# Patient Record
Sex: Female | Born: 1935 | Race: White | Hispanic: No | Marital: Married | Smoking: Never smoker
Health system: Southern US, Community
[De-identification: ages and names within clinical notes are randomized; demographics above are authoritative.]

## PROBLEM LIST (undated history)

## (undated) DIAGNOSIS — T7840XA Allergy, unspecified, initial encounter: Secondary | ICD-10-CM

## (undated) DIAGNOSIS — I059 Rheumatic mitral valve disease, unspecified: Secondary | ICD-10-CM

## (undated) DIAGNOSIS — K644 Residual hemorrhoidal skin tags: Secondary | ICD-10-CM

## (undated) DIAGNOSIS — R002 Palpitations: Secondary | ICD-10-CM

## (undated) DIAGNOSIS — I4891 Unspecified atrial fibrillation: Secondary | ICD-10-CM

## (undated) DIAGNOSIS — I2510 Atherosclerotic heart disease of native coronary artery without angina pectoris: Secondary | ICD-10-CM

## (undated) DIAGNOSIS — I509 Heart failure, unspecified: Secondary | ICD-10-CM

## (undated) DIAGNOSIS — R300 Dysuria: Secondary | ICD-10-CM

## (undated) DIAGNOSIS — E1129 Type 2 diabetes mellitus with other diabetic kidney complication: Secondary | ICD-10-CM

## (undated) DIAGNOSIS — E119 Type 2 diabetes mellitus without complications: Secondary | ICD-10-CM

## (undated) DIAGNOSIS — M1990 Unspecified osteoarthritis, unspecified site: Secondary | ICD-10-CM

## (undated) DIAGNOSIS — I10 Essential (primary) hypertension: Secondary | ICD-10-CM

## (undated) DIAGNOSIS — R5381 Other malaise: Secondary | ICD-10-CM

## (undated) DIAGNOSIS — E559 Vitamin D deficiency, unspecified: Secondary | ICD-10-CM

## (undated) DIAGNOSIS — D649 Anemia, unspecified: Secondary | ICD-10-CM

## (undated) DIAGNOSIS — R609 Edema, unspecified: Secondary | ICD-10-CM

## (undated) DIAGNOSIS — M2550 Pain in unspecified joint: Secondary | ICD-10-CM

## (undated) DIAGNOSIS — E871 Hypo-osmolality and hyponatremia: Secondary | ICD-10-CM

## (undated) DIAGNOSIS — E785 Hyperlipidemia, unspecified: Secondary | ICD-10-CM

## (undated) DIAGNOSIS — I8390 Asymptomatic varicose veins of unspecified lower extremity: Secondary | ICD-10-CM

## (undated) DIAGNOSIS — R110 Nausea: Secondary | ICD-10-CM

## (undated) DIAGNOSIS — R5383 Other fatigue: Secondary | ICD-10-CM

## (undated) DIAGNOSIS — E781 Pure hyperglyceridemia: Secondary | ICD-10-CM

## (undated) DIAGNOSIS — K219 Gastro-esophageal reflux disease without esophagitis: Secondary | ICD-10-CM

## (undated) DIAGNOSIS — M791 Myalgia, unspecified site: Secondary | ICD-10-CM

## (undated) DIAGNOSIS — R194 Change in bowel habit: Secondary | ICD-10-CM

## (undated) HISTORY — DX: Unspecified atrial fibrillation: I48.91

## (undated) HISTORY — DX: Hyperlipidemia, unspecified: E78.5

## (undated) HISTORY — PX: APPENDECTOMY: SHX54

## (undated) HISTORY — DX: Change in bowel habit: R19.4

## (undated) HISTORY — DX: Rheumatic mitral valve disease, unspecified: I05.9

## (undated) HISTORY — PX: HERNIA REPAIR: SHX51

## (undated) HISTORY — DX: Pain in unspecified joint: M25.50

## (undated) HISTORY — DX: Allergy, unspecified, initial encounter: T78.40XA

## (undated) HISTORY — PX: ABDOMINAL HYSTERECTOMY: SHX81

## (undated) HISTORY — DX: Heart failure, unspecified: I50.9

## (undated) HISTORY — PX: CHOLECYSTECTOMY: SHX55

## (undated) HISTORY — DX: Pure hyperglyceridemia: E78.1

## (undated) HISTORY — DX: Myalgia, unspecified site: M79.10

## (undated) HISTORY — DX: Gastro-esophageal reflux disease without esophagitis: K21.9

## (undated) HISTORY — DX: Nausea: R11.0

## (undated) HISTORY — DX: Hypo-osmolality and hyponatremia: E87.1

## (undated) HISTORY — DX: Atherosclerotic heart disease of native coronary artery without angina pectoris: I25.10

## (undated) HISTORY — DX: Edema, unspecified: R60.9

## (undated) HISTORY — DX: Unspecified osteoarthritis, unspecified site: M19.90

## (undated) HISTORY — DX: Other malaise: R53.81

## (undated) HISTORY — DX: Type 2 diabetes mellitus with other diabetic kidney complication: E11.29

## (undated) HISTORY — PX: TONSILLECTOMY: SUR1361

## (undated) HISTORY — DX: Vitamin D deficiency, unspecified: E55.9

## (undated) HISTORY — DX: Anemia, unspecified: D64.9

## (undated) HISTORY — PX: BREAST SURGERY: SHX581

## (undated) HISTORY — DX: Palpitations: R00.2

## (undated) HISTORY — DX: Residual hemorrhoidal skin tags: K64.4

## (undated) HISTORY — DX: Other fatigue: R53.83

## (undated) HISTORY — DX: Asymptomatic varicose veins of unspecified lower extremity: I83.90

## (undated) HISTORY — DX: Dysuria: R30.0

---

## 2005-02-08 ENCOUNTER — Ambulatory Visit: Payer: Self-pay | Admitting: Family Medicine

## 2005-02-15 ENCOUNTER — Ambulatory Visit: Payer: Self-pay | Admitting: Family Medicine

## 2005-03-25 ENCOUNTER — Ambulatory Visit: Payer: Self-pay | Admitting: Family Medicine

## 2005-04-14 ENCOUNTER — Other Ambulatory Visit: Payer: Self-pay

## 2005-04-14 ENCOUNTER — Emergency Department: Payer: Self-pay | Admitting: Emergency Medicine

## 2005-04-23 ENCOUNTER — Other Ambulatory Visit: Payer: Self-pay

## 2005-04-23 ENCOUNTER — Emergency Department: Payer: Self-pay | Admitting: Emergency Medicine

## 2005-06-06 ENCOUNTER — Inpatient Hospital Stay: Payer: Self-pay | Admitting: Internal Medicine

## 2005-06-06 ENCOUNTER — Other Ambulatory Visit: Payer: Self-pay

## 2005-06-07 ENCOUNTER — Other Ambulatory Visit: Payer: Self-pay

## 2006-01-19 ENCOUNTER — Ambulatory Visit: Payer: Self-pay

## 2006-02-07 ENCOUNTER — Ambulatory Visit: Payer: Self-pay

## 2006-03-10 ENCOUNTER — Other Ambulatory Visit: Payer: Self-pay

## 2006-03-10 ENCOUNTER — Inpatient Hospital Stay: Payer: Self-pay | Admitting: Internal Medicine

## 2006-03-11 ENCOUNTER — Other Ambulatory Visit: Payer: Self-pay

## 2006-03-18 ENCOUNTER — Other Ambulatory Visit: Payer: Self-pay

## 2006-04-13 ENCOUNTER — Ambulatory Visit: Payer: Self-pay | Admitting: Family Medicine

## 2006-04-27 ENCOUNTER — Ambulatory Visit: Payer: Self-pay | Admitting: Family Medicine

## 2006-05-08 ENCOUNTER — Ambulatory Visit: Payer: Self-pay | Admitting: Neurosurgery

## 2007-04-21 IMAGING — CR DG CHEST 1V PORT
1 series · 1 of 1 positions shown · non-contrast
Comparison: none

REASON FOR EXAM: irrg heart beat
COMMENTS:

PROCEDURE:     DXR - DXR PORTABLE CHEST SINGLE VIEW  - June 06, 2005  [DATE]
RESULT:       Comparison is made to a prior study dated 04/23/05.
The lungs are clear.  The cardiac silhouette and visualized bony skeleton
are unremarkable.

[view not recorded]
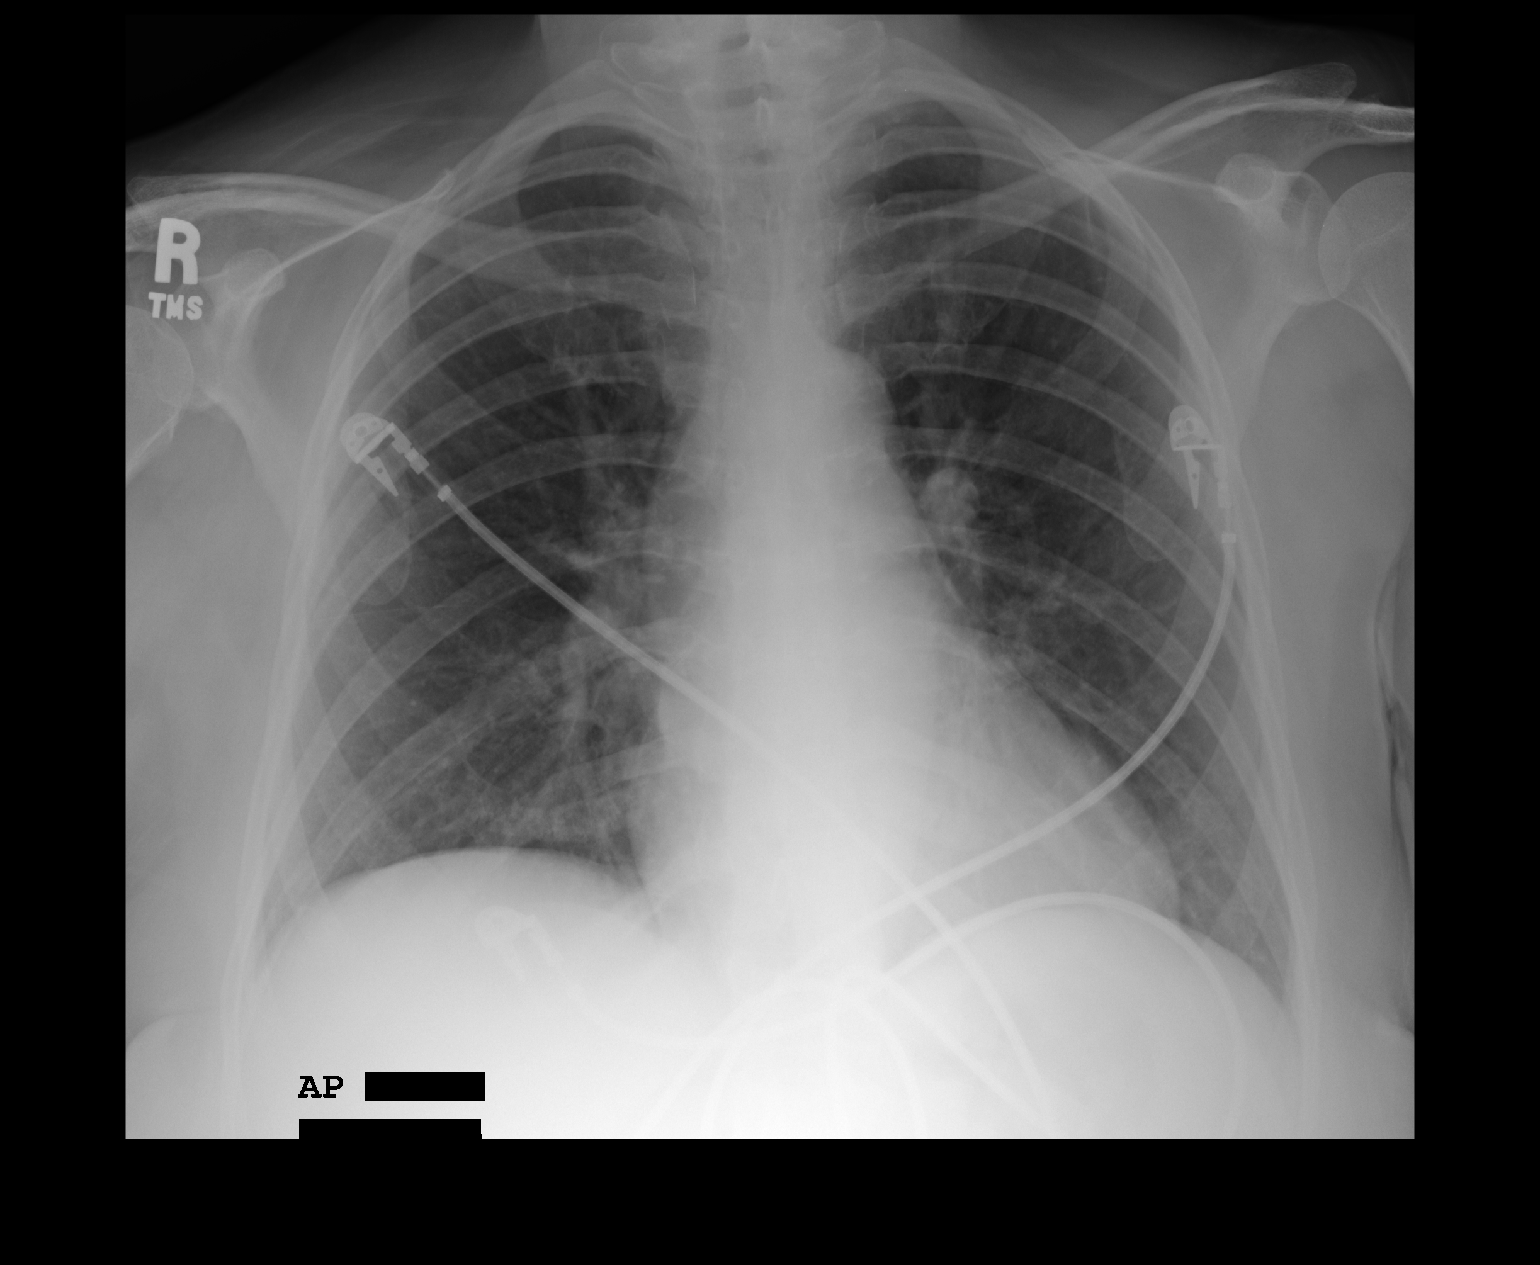

[1 of 1 positions shown; findings below may reference images not displayed]

IMPRESSION: Chest radiograph without evidence of acute cardiopulmonary
disease.

## 2007-05-02 ENCOUNTER — Ambulatory Visit: Payer: Self-pay | Admitting: Family Medicine

## 2007-11-01 ENCOUNTER — Ambulatory Visit: Payer: Self-pay | Admitting: Neurosurgery

## 2008-05-02 ENCOUNTER — Ambulatory Visit: Payer: Self-pay | Admitting: Family Medicine

## 2009-03-19 DIAGNOSIS — E1129 Type 2 diabetes mellitus with other diabetic kidney complication: Secondary | ICD-10-CM

## 2009-03-19 HISTORY — DX: Type 2 diabetes mellitus with other diabetic kidney complication: E11.29

## 2009-04-01 DIAGNOSIS — I059 Rheumatic mitral valve disease, unspecified: Secondary | ICD-10-CM

## 2009-04-01 DIAGNOSIS — I4891 Unspecified atrial fibrillation: Secondary | ICD-10-CM

## 2009-04-01 HISTORY — DX: Rheumatic mitral valve disease, unspecified: I05.9

## 2009-04-01 HISTORY — DX: Unspecified atrial fibrillation: I48.91

## 2009-04-04 DIAGNOSIS — T7840XA Allergy, unspecified, initial encounter: Secondary | ICD-10-CM

## 2009-04-04 DIAGNOSIS — J309 Allergic rhinitis, unspecified: Secondary | ICD-10-CM | POA: Insufficient documentation

## 2009-04-04 HISTORY — DX: Allergy, unspecified, initial encounter: T78.40XA

## 2009-05-05 ENCOUNTER — Ambulatory Visit: Payer: Self-pay | Admitting: Family Medicine

## 2009-08-04 DIAGNOSIS — I2510 Atherosclerotic heart disease of native coronary artery without angina pectoris: Secondary | ICD-10-CM

## 2009-08-04 DIAGNOSIS — K644 Residual hemorrhoidal skin tags: Secondary | ICD-10-CM

## 2009-08-04 HISTORY — DX: Atherosclerotic heart disease of native coronary artery without angina pectoris: I25.10

## 2009-08-04 HISTORY — DX: Residual hemorrhoidal skin tags: K64.4

## 2009-09-17 ENCOUNTER — Inpatient Hospital Stay: Payer: Self-pay | Admitting: Internal Medicine

## 2009-10-28 ENCOUNTER — Ambulatory Visit: Payer: Self-pay | Admitting: Gastroenterology

## 2009-11-06 DIAGNOSIS — K219 Gastro-esophageal reflux disease without esophagitis: Secondary | ICD-10-CM | POA: Insufficient documentation

## 2009-11-06 HISTORY — DX: Gastro-esophageal reflux disease without esophagitis: K21.9

## 2009-11-28 HISTORY — PX: OTHER SURGICAL HISTORY: SHX169

## 2010-02-01 DIAGNOSIS — E785 Hyperlipidemia, unspecified: Secondary | ICD-10-CM | POA: Insufficient documentation

## 2010-02-01 HISTORY — DX: Hyperlipidemia, unspecified: E78.5

## 2010-05-25 DIAGNOSIS — R5381 Other malaise: Secondary | ICD-10-CM | POA: Insufficient documentation

## 2010-06-21 ENCOUNTER — Ambulatory Visit: Payer: Self-pay | Admitting: Gastroenterology

## 2010-06-21 LAB — HM COLONOSCOPY

## 2010-06-23 LAB — PATHOLOGY REPORT

## 2010-08-23 ENCOUNTER — Ambulatory Visit: Payer: Self-pay | Admitting: Family Medicine

## 2010-09-09 ENCOUNTER — Ambulatory Visit: Payer: Self-pay | Admitting: Family Medicine

## 2011-08-02 IMAGING — CT CT HEAD WITHOUT CONTRAST
2 series · 16 of 30 positions shown, 20 images · non-contrast
Comparison: none

REASON FOR EXAM: ha weakness htn
COMMENTS:

[Series 2: without · axial · non-contrast · 0.42mm/px · z∈[+145,+270]mm · 13 of 31 slices shown, 17 images]
[im 3/31  brain]
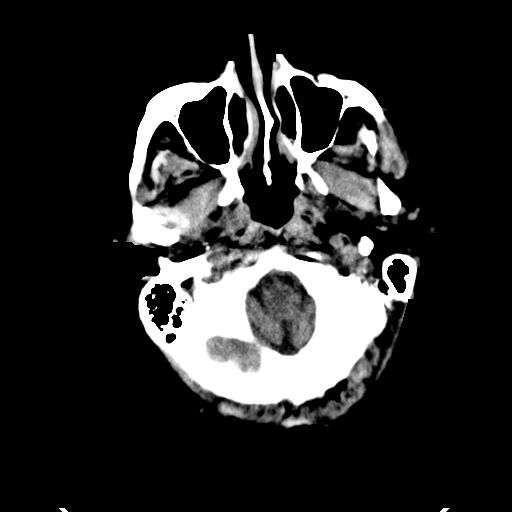
[im 3/31  bone]
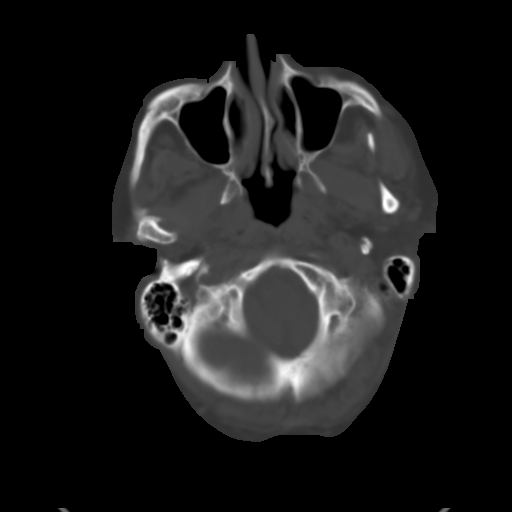
[im 5/31  brain]
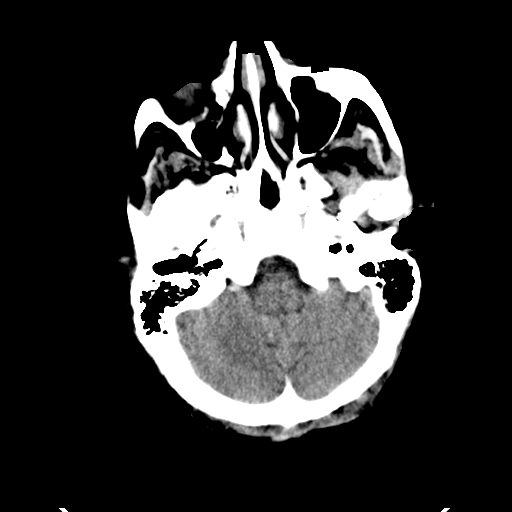
[im 7/31  brain]
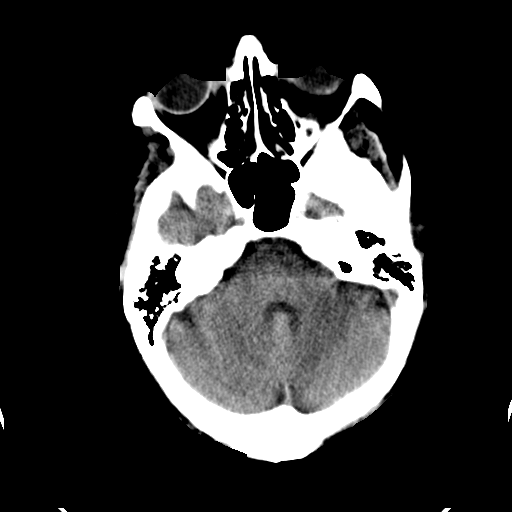
[im 9/31  brain]
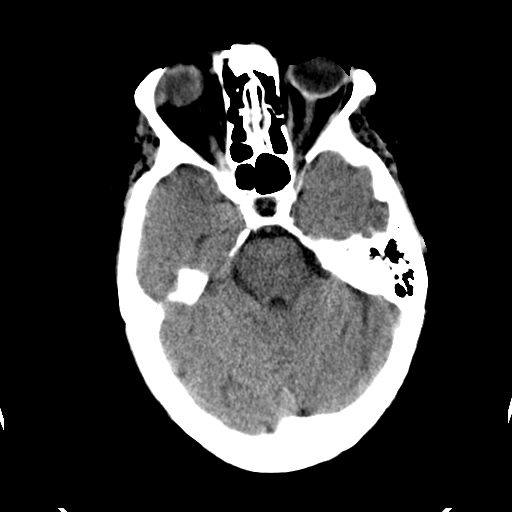
[im 11/31  brain]
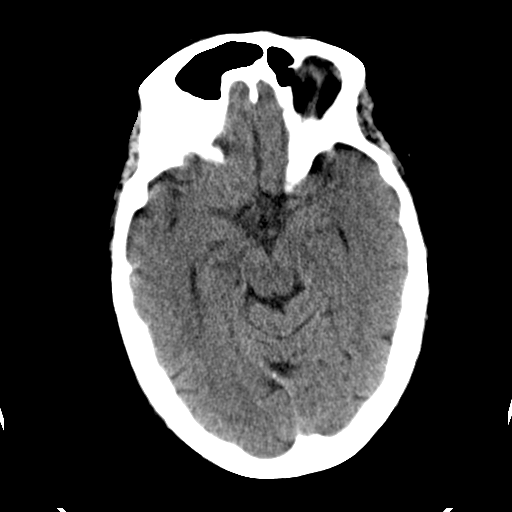
[im 11/31  bone]
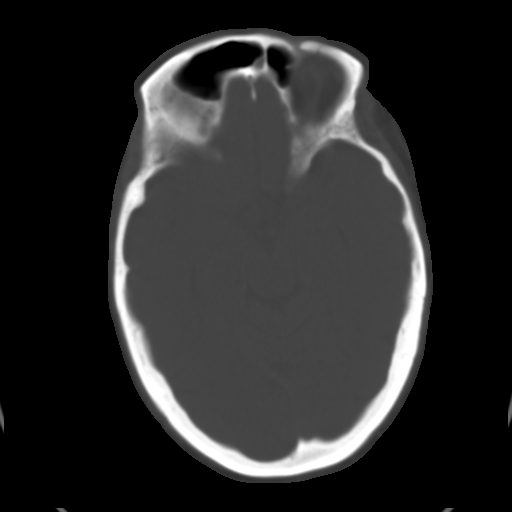
[im 13/31  brain]
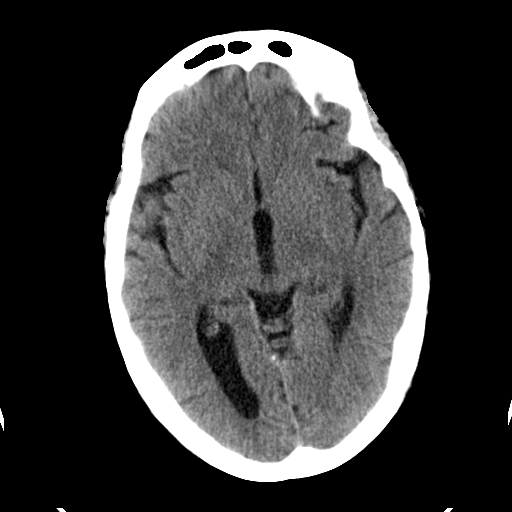
[im 16/31  brain]
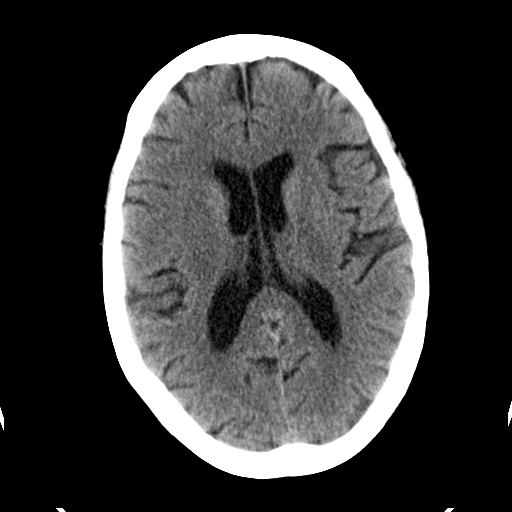
[im 18/31  brain]
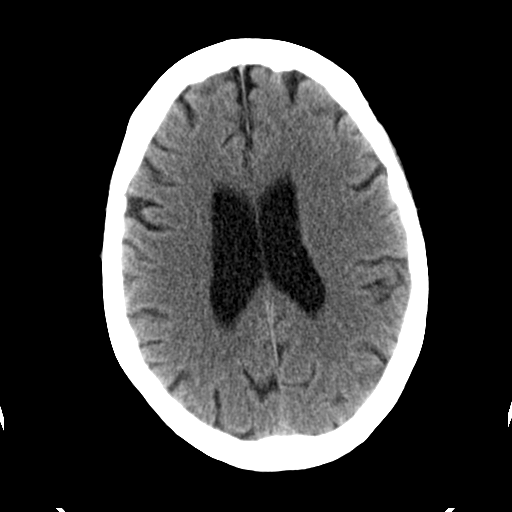
[im 20/31  brain]
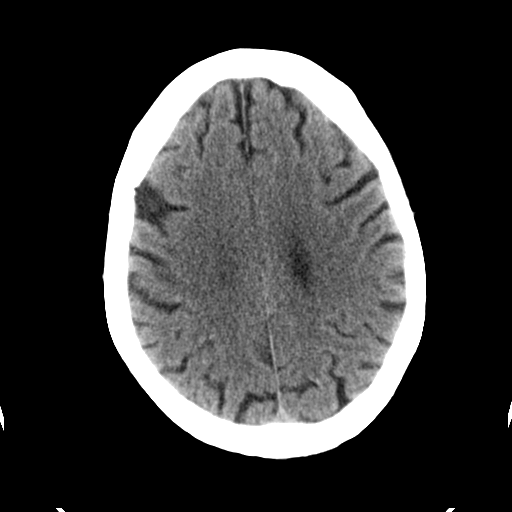
[im 20/31  bone]
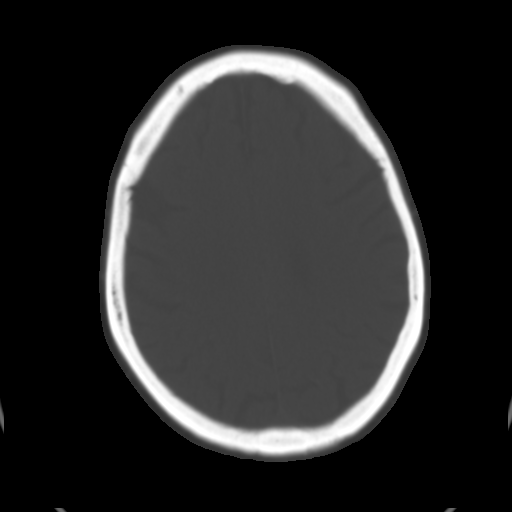
[im 22/31  brain]
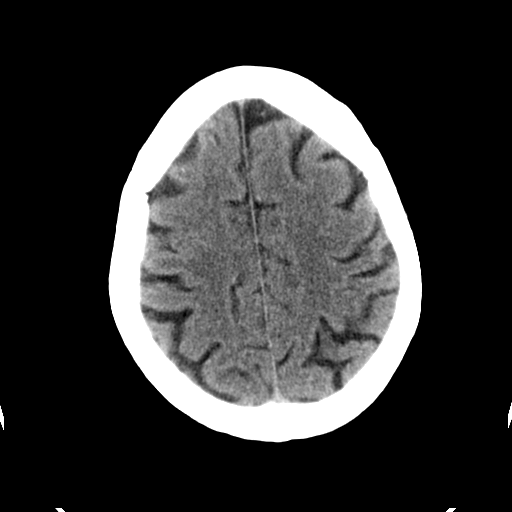
[im 24/31  brain]
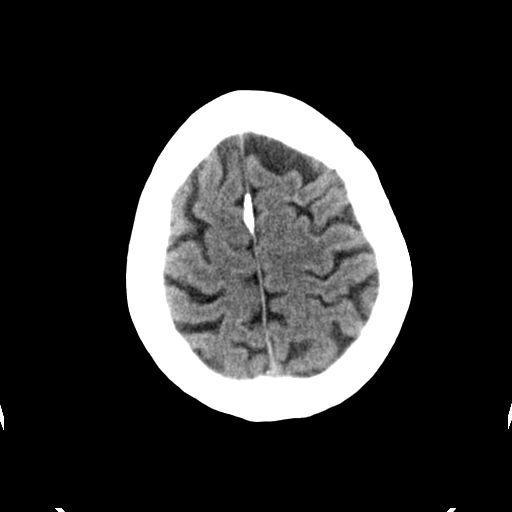
[im 26/31  brain]
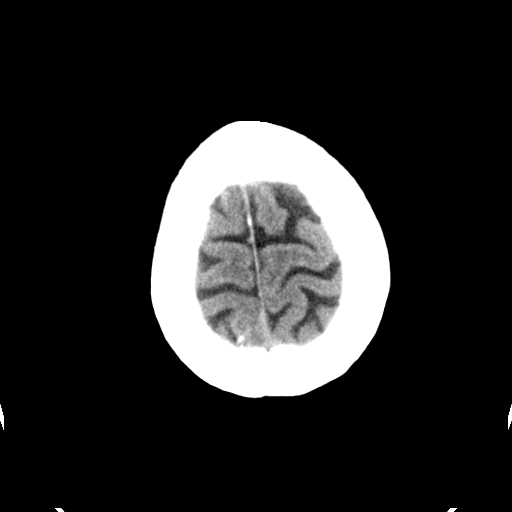
[im 28/31  brain]
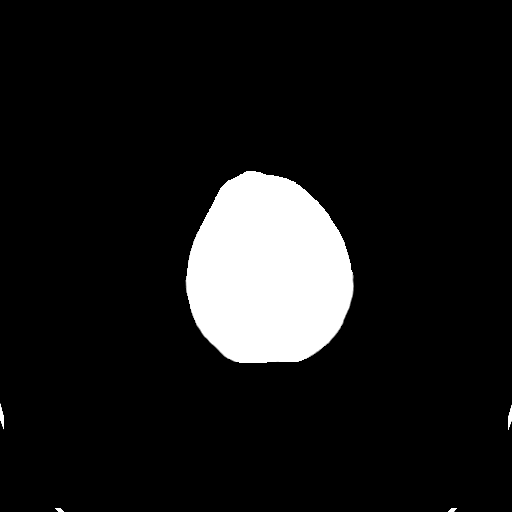
[im 28/31  bone]
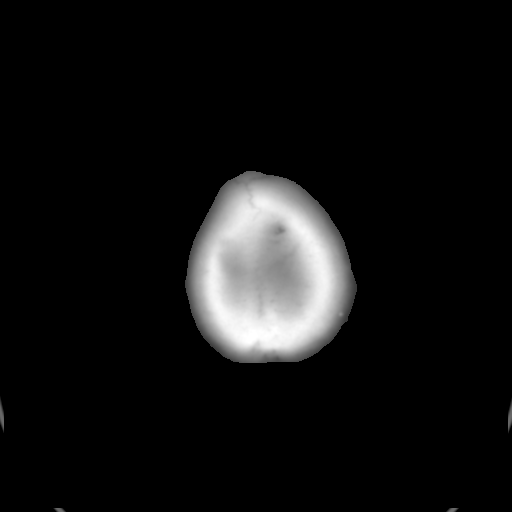

[Series 3: bone · axial · 0.42mm/px · z∈[+145,+185]mm · 3 of 30 slices shown]
[im 3/30  bone]
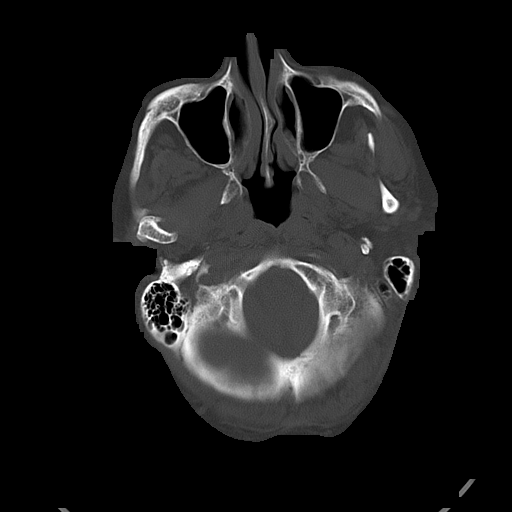
[im 7/30  bone]
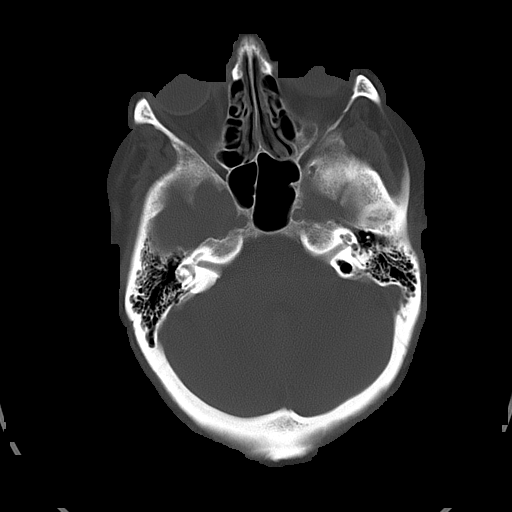
[im 11/30  bone]
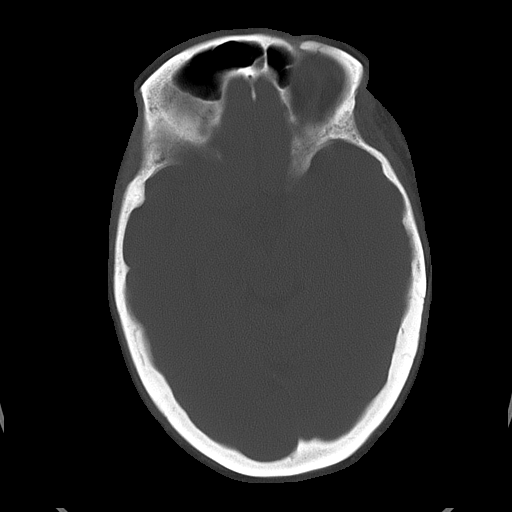

[16 of 30 positions shown; findings below may reference images not displayed]

PROCEDURE:     CT  - CT HEAD WITHOUT CONTRAST  - September 17, 2009  [DATE]

RESULT:     History: Weakness, headache, hypertension.

Comparison Study: No intra-axial or extra-axial pathologic fluid or blood
collections identified. Old left lenticular nucleus lacunar infarct noted.
No evidence of hemorrhage or hydrocephalus.
IMPRESSION: Chronic ischemic changem, no acute abnormality.

## 2011-08-03 IMAGING — CR DG ABDOMEN 2V
1 series · 2 of 2 positions shown · non-contrast
Comparison: none

REASON FOR EXAM: constipation
COMMENTS:

PROCEDURE:     DXR - DXR ABDOMEN 2 V FLAT AND ERECT  - September 18, 2009  [DATE]
RESULT:     The bowel gas pattern suggests an element of constipation. There
is no evidence of obstruction or ileus. The lung bases are grossly clear.
There are surgical clips in the gallbladder fossa.

[Series 1: view not recorded · 0.17mm/px · 2 of 2 slices shown]
[im 1/2]
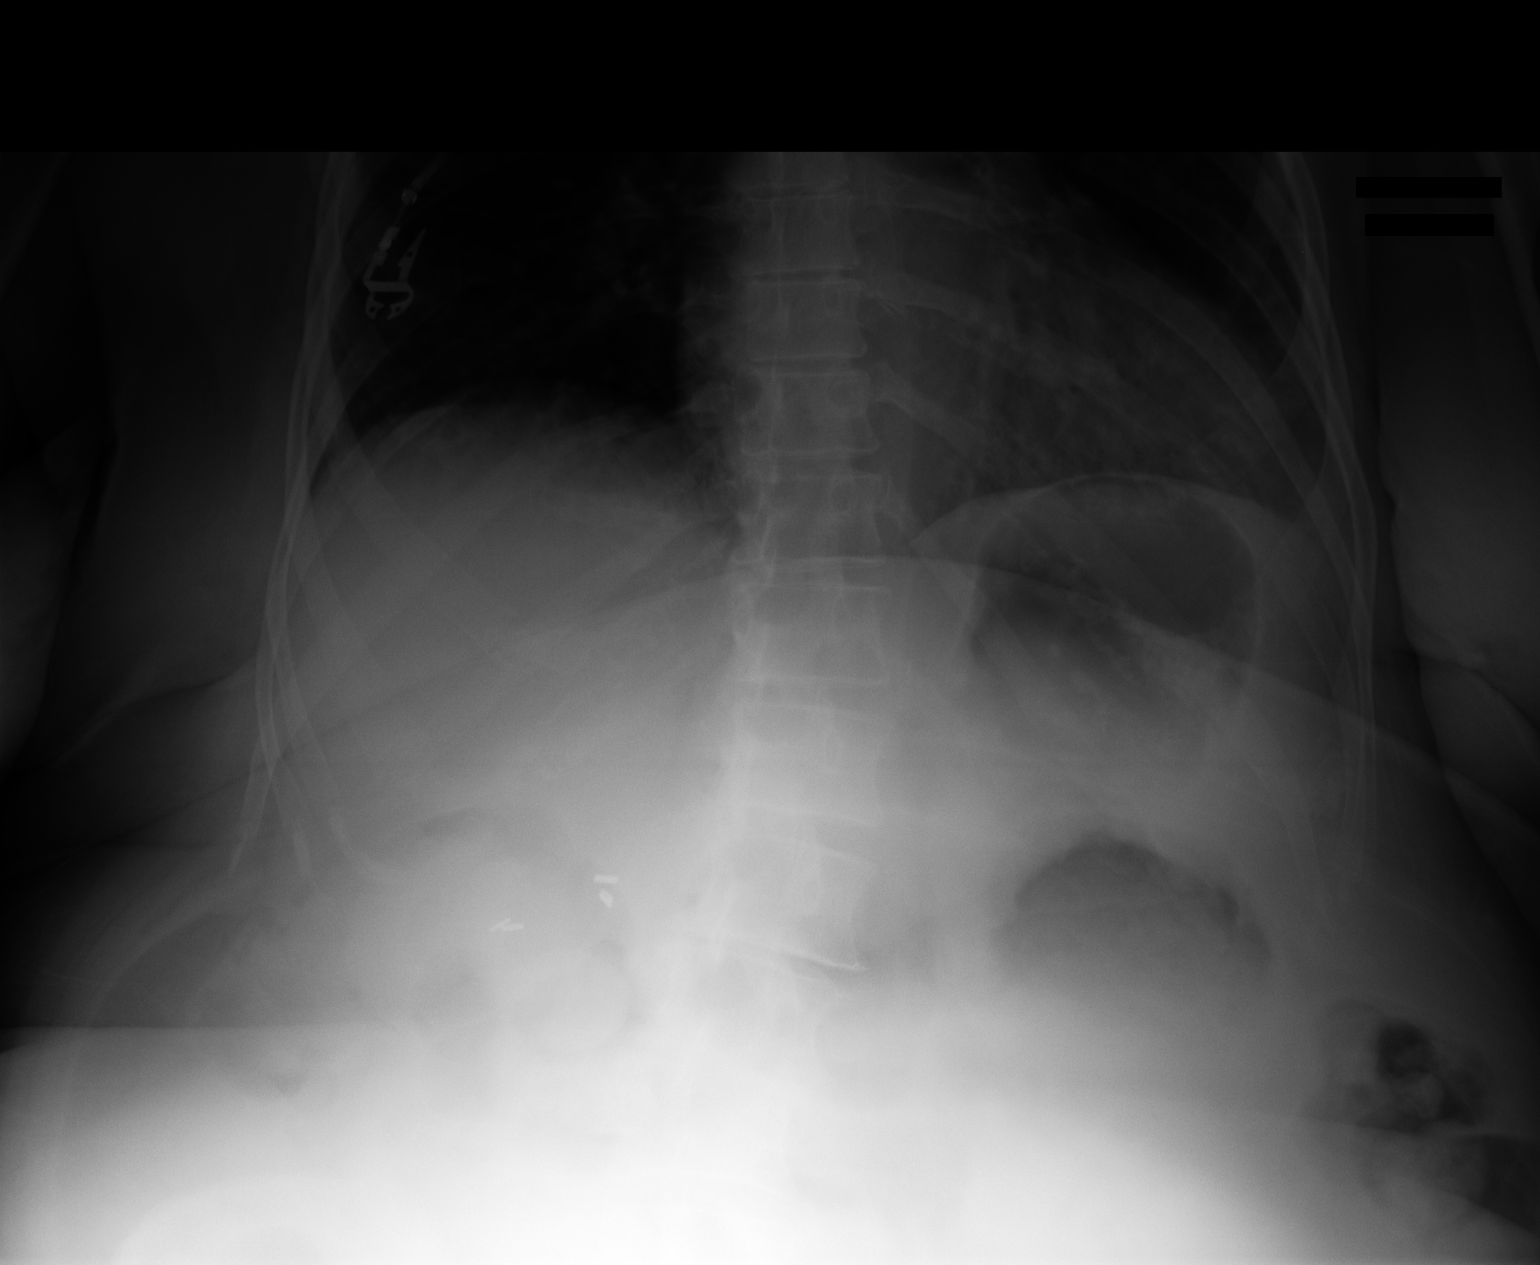
[im 2/2]
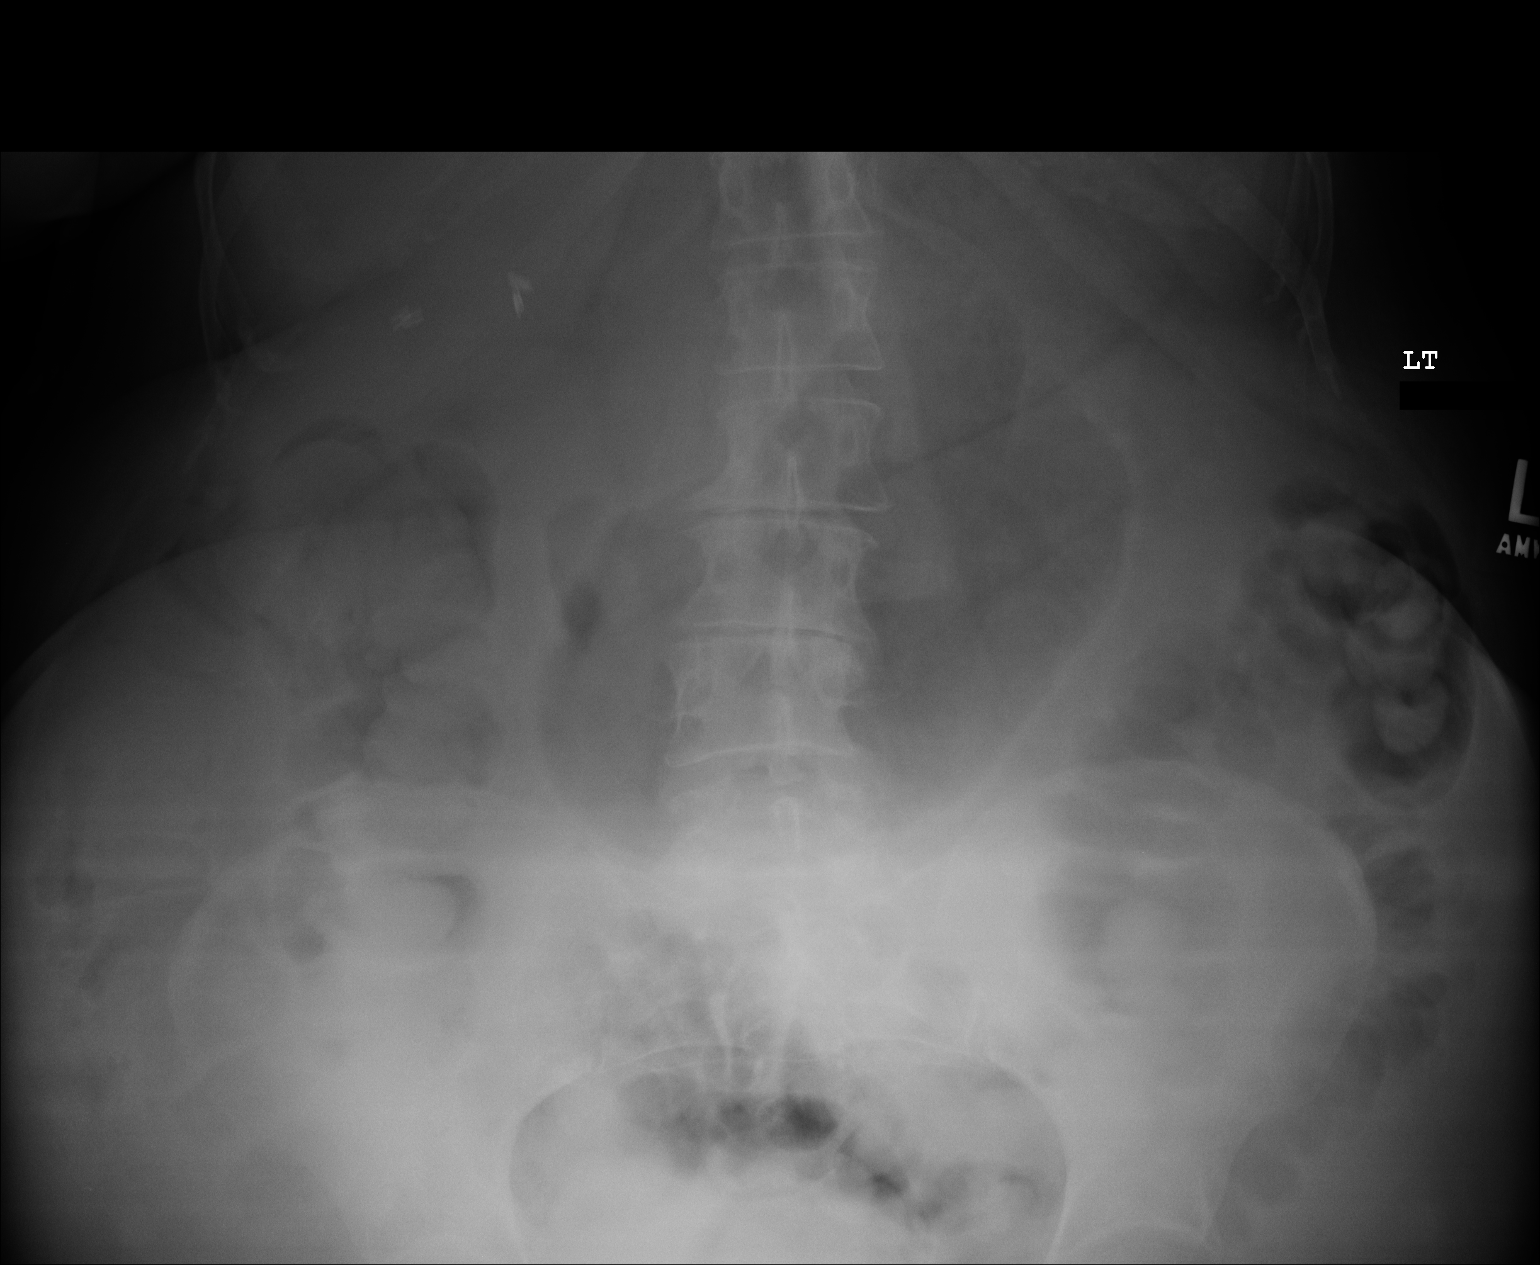

[2 of 2 positions shown; findings below may reference images not displayed]

IMPRESSION: The bowel gas pattern suggests constipation.

## 2011-09-21 ENCOUNTER — Ambulatory Visit: Payer: Self-pay | Admitting: Family Medicine

## 2012-08-03 ENCOUNTER — Ambulatory Visit: Payer: Self-pay | Admitting: Family Medicine

## 2012-09-24 ENCOUNTER — Ambulatory Visit: Payer: Self-pay | Admitting: Family Medicine

## 2012-10-01 ENCOUNTER — Ambulatory Visit: Payer: Self-pay | Admitting: Ophthalmology

## 2012-10-01 DIAGNOSIS — I10 Essential (primary) hypertension: Secondary | ICD-10-CM

## 2012-10-01 LAB — POTASSIUM: Potassium: 4.1 mmol/L (ref 3.5–5.1)

## 2012-10-10 ENCOUNTER — Ambulatory Visit: Payer: Self-pay | Admitting: Ophthalmology

## 2013-09-25 ENCOUNTER — Ambulatory Visit: Payer: Self-pay | Admitting: Family Medicine

## 2014-04-29 LAB — HEPATIC FUNCTION PANEL
ALT: 13 U/L (ref 7–35)
AST: 14 U/L (ref 13–35)

## 2014-08-12 ENCOUNTER — Emergency Department: Payer: Self-pay | Admitting: Student

## 2014-08-12 LAB — BASIC METABOLIC PANEL
ANION GAP: 11 (ref 7–16)
BUN: 33 mg/dL — ABNORMAL HIGH (ref 7–18)
Calcium, Total: 9.1 mg/dL (ref 8.5–10.1)
Chloride: 99 mmol/L (ref 98–107)
Co2: 24 mmol/L (ref 21–32)
Creatinine: 0.82 mg/dL (ref 0.60–1.30)
Glucose: 136 mg/dL — ABNORMAL HIGH (ref 65–99)
Osmolality: 278 (ref 275–301)
Potassium: 4 mmol/L (ref 3.5–5.1)
SODIUM: 134 mmol/L — AB (ref 136–145)

## 2014-08-12 LAB — CBC
HCT: 35.3 % (ref 35.0–47.0)
HGB: 11.5 g/dL — AB (ref 12.0–16.0)
MCH: 29.3 pg (ref 26.0–34.0)
MCHC: 32.6 g/dL (ref 32.0–36.0)
MCV: 90 fL (ref 80–100)
Platelet: 297 10*3/uL (ref 150–440)
RBC: 3.93 10*6/uL (ref 3.80–5.20)
RDW: 14.3 % (ref 11.5–14.5)
WBC: 10.2 10*3/uL (ref 3.6–11.0)

## 2014-08-12 LAB — URINALYSIS, COMPLETE
BACTERIA: NONE SEEN
BILIRUBIN, UR: NEGATIVE
Blood: NEGATIVE
Glucose,UR: NEGATIVE mg/dL (ref 0–75)
Ketone: NEGATIVE
Leukocyte Esterase: NEGATIVE
Nitrite: NEGATIVE
Ph: 7 (ref 4.5–8.0)
RBC, UR: NONE SEEN /HPF (ref 0–5)
Specific Gravity: 1.005 (ref 1.003–1.030)
Squamous Epithelial: NONE SEEN
WBC UR: 3 /HPF (ref 0–5)

## 2014-08-12 LAB — TROPONIN I

## 2014-09-06 LAB — HM MAMMOGRAPHY

## 2014-09-30 ENCOUNTER — Ambulatory Visit: Payer: Self-pay | Admitting: Family Medicine

## 2014-10-07 ENCOUNTER — Ambulatory Visit: Payer: Self-pay | Admitting: Family Medicine

## 2015-01-15 DIAGNOSIS — I480 Paroxysmal atrial fibrillation: Secondary | ICD-10-CM | POA: Insufficient documentation

## 2015-01-15 DIAGNOSIS — R600 Localized edema: Secondary | ICD-10-CM | POA: Insufficient documentation

## 2015-03-05 LAB — CBC AND DIFFERENTIAL: WBC: 10.4 10^3/mL

## 2015-03-05 LAB — HEMOGLOBIN A1C: Hgb A1c MFr Bld: 6.8 % — AB (ref 4.0–6.0)

## 2015-03-17 NOTE — Op Note (Signed)
PATIENT NAME:  [name], Katherine BeanCONSTANCE C MR#:  [id number] DATE OF BIRTH:  10/07/1932  DATE OF PROCEDURE:  10/10/2012  PREOPERATIVE DIAGNOSIS:  Senile cataract left eye.  POSTOPERATIVE DIAGNOSIS:  Senile cataract left eye.  PROCEDURE:  Phacoemulsification with posterior chamber intraocular lens implantation of the left eye.  LENS:  ZCB00 23.5-diopter posterior chamber intraocular lens.  ULTRASOUND TIME:  12% of 1 minute, 26 seconds. CDE 10.2.  SURGEON:  Italyhad Marranda Arakelian, MD  ANESTHESIA:  Retrobulbar block of Xylocaine and Bupivacaine.  COMPLICATIONS:  None.  DESCRIPTION OF PROCEDURE:  The patient was identified in the holding room and transported to the operating room and placed in the supine position under the operating microscope.  The left eye was identified as the operative eye and a retrobulbar block was performed under intravenous sedation.  It was then prepped and draped in the usual sterile ophthalmic fashion.  A 1 millimeter clear-corneal paracentesis was made at the 1:30 position.  The anterior chamber was filled with Viscoat viscoelastic.  A 2.4 millimeter keratome was used to make a near-clear corneal incision at the 10:30 position.  A curvilinear capsulorrhexis was made with a cystotome and capsulorrhexis forceps.  Balanced salt solution was used to hydrodissect and hydrodelineate the nucleus.  Phacoemulsification was then used in horizontal chopping fashion to remove the lens nucleus and epinucleus.  The remaining cortex was then removed using the irrigation and aspiration handpiece.  Provisc was then placed into the capsular bag to distend it for lens placement.  An ZCB00 23.5-diopter lens was then injected into the capsular bag.  The remaining viscoelastic was aspirated.  Wounds were hydrated with balanced salt solution.  The anterior chamber was inflated to a physiologic pressure with balanced salt solution. Mio stat was placed into the anterior chamber to constrict the pupil.  No  wound leaks were noted.  Topical Vigamox drops and erythromycin ointment were applied to the eye.  The eye was patched.  The patient was taken to the recovery room in stable condition without complications of anesthesia or surgery.  ____________________________ Deirdre Evenerhadwick R. Wenceslaus Gist, MD crb:cms D: 10/10/2012 15:08:13 ET T: 10/10/2012 15:34:10 ET JOB#: [id number]  cc: Deirdre Evenerhadwick R. Graylyn Bunney, MD, <Dictator> Lockie MolaHADWICK Emilyn Ruble MD ELECTRONICALLY SIGNED 10/16/2012 12:22

## 2015-04-14 LAB — BASIC METABOLIC PANEL
BUN: 36 mg/dL — AB (ref 4–21)
Creatinine: 1.1 mg/dL (ref <=1.1)
Glucose: 155 mg/dL
Sodium: 135 mmol/L — AB (ref 137–147)

## 2015-05-03 ENCOUNTER — Encounter: Payer: Self-pay | Admitting: Emergency Medicine

## 2015-05-03 ENCOUNTER — Emergency Department
Admission: EM | Admit: 2015-05-03 | Discharge: 2015-05-04 | Payer: Medicare Other | Attending: Emergency Medicine | Admitting: Emergency Medicine

## 2015-05-03 ENCOUNTER — Emergency Department: Payer: Medicare Other

## 2015-05-03 DIAGNOSIS — H00033 Abscess of eyelid right eye, unspecified eyelid: Secondary | ICD-10-CM | POA: Insufficient documentation

## 2015-05-03 DIAGNOSIS — I10 Essential (primary) hypertension: Secondary | ICD-10-CM | POA: Diagnosis not present

## 2015-05-03 DIAGNOSIS — E119 Type 2 diabetes mellitus without complications: Secondary | ICD-10-CM | POA: Insufficient documentation

## 2015-05-03 DIAGNOSIS — I4891 Unspecified atrial fibrillation: Secondary | ICD-10-CM | POA: Insufficient documentation

## 2015-05-03 DIAGNOSIS — H5711 Ocular pain, right eye: Secondary | ICD-10-CM | POA: Diagnosis present

## 2015-05-03 DIAGNOSIS — Z880 Allergy status to penicillin: Secondary | ICD-10-CM | POA: Diagnosis not present

## 2015-05-03 DIAGNOSIS — H05011 Cellulitis of right orbit: Secondary | ICD-10-CM

## 2015-05-03 HISTORY — DX: Type 2 diabetes mellitus without complications: E11.9

## 2015-05-03 HISTORY — DX: Essential (primary) hypertension: I10

## 2015-05-03 LAB — CBC WITH DIFFERENTIAL/PLATELET
Basophils Absolute: 0.1 10*3/uL (ref 0–0.1)
Basophils Relative: 1 %
EOS ABS: 0 10*3/uL (ref 0–0.7)
EOS PCT: 0 %
HCT: 32.9 % — ABNORMAL LOW (ref 35.0–47.0)
Hemoglobin: 11 g/dL — ABNORMAL LOW (ref 12.0–16.0)
Lymphocytes Relative: 15 %
Lymphs Abs: 2.6 10*3/uL (ref 1.0–3.6)
MCH: 30.3 pg (ref 26.0–34.0)
MCHC: 33.4 g/dL (ref 32.0–36.0)
MCV: 90.7 fL (ref 80.0–100.0)
Monocytes Absolute: 1.7 10*3/uL — ABNORMAL HIGH (ref 0.2–0.9)
Monocytes Relative: 10 %
Neutro Abs: 12.2 10*3/uL — ABNORMAL HIGH (ref 1.4–6.5)
Neutrophils Relative %: 74 %
Platelets: 313 10*3/uL (ref 150–440)
RBC: 3.62 MIL/uL — ABNORMAL LOW (ref 3.80–5.20)
RDW: 13.6 % (ref 11.5–14.5)
WBC: 16.6 10*3/uL — ABNORMAL HIGH (ref 3.6–11.0)

## 2015-05-03 LAB — BASIC METABOLIC PANEL
Anion gap: 11 (ref 5–15)
BUN: 27 mg/dL — ABNORMAL HIGH (ref 6–20)
CALCIUM: 9.3 mg/dL (ref 8.9–10.3)
CHLORIDE: 94 mmol/L — AB (ref 101–111)
CO2: 27 mmol/L (ref 22–32)
CREATININE: 0.95 mg/dL (ref 0.44–1.00)
GFR calc non Af Amer: 55 mL/min — ABNORMAL LOW (ref >60)
Glucose, Bld: 149 mg/dL — ABNORMAL HIGH (ref 65–99)
Potassium: 4 mmol/L (ref 3.5–5.1)
Sodium: 132 mmol/L — ABNORMAL LOW (ref 135–145)

## 2015-05-03 MED ORDER — IOHEXOL 300 MG/ML  SOLN
75.0000 mL | Freq: Once | INTRAMUSCULAR | Status: AC | PRN
  Administered 2015-05-03: 75 mL via INTRAVENOUS

## 2015-05-03 MED ORDER — METOPROLOL TARTRATE 1 MG/ML IV SOLN
INTRAVENOUS | Status: AC
  Administered 2015-05-03: 5 mg via INTRAVENOUS
  Filled 2015-05-03: qty 5

## 2015-05-03 MED ORDER — METOPROLOL TARTRATE 1 MG/ML IV SOLN
INTRAVENOUS | Status: AC
Start: 2015-05-03 — End: 2015-05-03
  Administered 2015-05-03: 5 mg via INTRAVENOUS
  Filled 2015-05-03: qty 5

## 2015-05-03 MED ORDER — METOPROLOL TARTRATE 1 MG/ML IV SOLN
5.0000 mg | Freq: Once | INTRAVENOUS | Status: AC
  Administered 2015-05-03: 5 mg via INTRAVENOUS

## 2015-05-03 MED ORDER — SODIUM CHLORIDE 0.9 % IV BOLUS (SEPSIS): 1000.0000 mL | Freq: Once | INTRAVENOUS | Status: DC

## 2015-05-03 MED ORDER — DIPHENHYDRAMINE HCL 50 MG/ML IJ SOLN
INTRAMUSCULAR | Status: AC
  Filled 2015-05-03: qty 1

## 2015-05-03 MED ORDER — VANCOMYCIN HCL 10 G IV SOLR: 20.0000 mg/kg | Freq: Once | INTRAVENOUS | Status: DC

## 2015-05-03 MED ORDER — DIPHENHYDRAMINE HCL 50 MG/ML IJ SOLN
25.0000 mg | Freq: Once | INTRAMUSCULAR | Status: AC
  Administered 2015-05-03: 25 mg via INTRAVENOUS

## 2015-05-03 MED ORDER — SODIUM CHLORIDE 0.9 % IV BOLUS (SEPSIS)
1000.0000 mL | Freq: Once | INTRAVENOUS | Status: AC
  Administered 2015-05-03: 1000 mL via INTRAVENOUS

## 2015-05-03 MED ORDER — CEFTRIAXONE SODIUM IN DEXTROSE 40 MG/ML IV SOLN: 2.0000 g | Freq: Once | INTRAVENOUS | Status: DC

## 2015-05-03 NOTE — ED Provider Notes (Addendum)
[facility] Emergency Department Provider Note  ____________________________________________  Time seen: Approximately 8:30 PM  I have reviewed the triage vital signs and the nursing notes.   HISTORY  Chief Complaint Eye Problem    HPI TANDI HANKO is a 81 y.o. female with a history of diabetes and hypertension presents today with 3 days of worsening right eye pain and swelling. She said that she saw her ophthalmologist Dr. Lindon Romp this past Friday who prescribed Keflex for her. She says that her eye was completely swollen shut yesterday and now can open it today. However, her daughter-in-law became concerned today and brought her into the hospital for further evaluation because of the appearance of the eye. The patient is also complaining of some intermittent double vision which she says it started since Friday. She is also started warm compresses over the past day. She said that Dr. Fonnie Birkenhead diagnosis was a "blocked tear duct."   Past Medical History  Diagnosis Date  . Diabetes mellitus without complication   . Hypertension     There are no active problems to display for this patient.   Past Surgical History  Procedure Laterality Date  . Tonsillectomy    . Breast surgery    . Abdominal hysterectomy    . Appendectomy    . Hernia repair    . Cholecystectomy    . Stents  2011    No current outpatient prescriptions on file.  Allergies Penicillins and Sulfa antibiotics  History reviewed. No pertinent family history.  Social History History  Substance Use Topics  . Smoking status: Never Smoker   . Smokeless tobacco: Not on file  . Alcohol Use: No    Review of Systems Constitutional: No fever/chills Eyes: As above  ENT: No sore throat. Cardiovascular: Denies chest pain. Respiratory: Denies shortness of breath. Gastrointestinal: No abdominal pain.  No nausea, no vomiting.  No diarrhea.  No constipation. Genitourinary: Negative for  dysuria. Musculoskeletal: Negative for back pain. Skin: Negative for rash. Neurological: Negative for headaches, focal weakness or numbness.  10-point ROS otherwise negative.  ____________________________________________   PHYSICAL EXAM:  VITAL SIGNS: ED Triage Vitals  Enc Vitals Group     BP 05/03/15 1943 221/80 mmHg     Pulse Rate 05/03/15 1943 81     Resp 05/03/15 1943 22     Temp 05/03/15 1943 99.1 F (37.3 C)     Temp Source 05/03/15 1943 Oral     SpO2 05/03/15 1943 96 %     Weight 05/03/15 1943 190 lb (86.183 kg)     Height 05/03/15 1943  (1.6 m)     Head Cir --      Peak Flow --      Pain Score 05/03/15 1946 9     Pain Loc --      Pain Edu? --      Excl. in GC? --     Constitutional: Alert and oriented. Well appearing and in no acute distress. Eyes: Conjunctivae are normal. PERRL. EOMI. right periorbital area with mild erythema and swelling. There is no discharge. Both the upper and lower eyelids were everted without any sign of foreign body. Left eye with normal appearance. Right eye visual acuity 20/70 which is uncorrected. Left eye 20/200 which is uncorrected. Head: Atraumatic. Nose: No congestion/rhinnorhea. Mouth/Throat: Mucous membranes are moist.  Oropharynx non-erythematous. Neck: No stridor.   Cardiovascular: Normal rate, regular rhythm. Grossly normal heart sounds.  Good peripheral circulation. Respiratory: Normal respiratory effort.  No retractions. Lungs CTAB. Gastrointestinal: Soft and nontender. No distention. No abdominal bruits. No CVA tenderness. Musculoskeletal: No lower extremity tenderness nor edema.  No joint effusions. Neurologic:  Normal speech and language. No gross focal neurologic deficits are appreciated. Speech is normal. No gait instability. Skin:  Skin is warm, dry and intact. No rash noted. Psychiatric: Mood and affect are normal. Speech and behavior are normal.  ____________________________________________   LABS (all labs  ordered are listed, but only abnormal results are displayed)  Labs Reviewed  CBC WITH DIFFERENTIAL/PLATELET - Abnormal; Notable for the following:    WBC 16.6 (*)    RBC 3.62 (*)    Hemoglobin 11.0 (*)    HCT 32.9 (*)    Neutro Abs 12.2 (*)    Monocytes Absolute 1.7 (*)    All other components within normal limits  BASIC METABOLIC PANEL - Abnormal; Notable for the following:    Sodium 132 (*)    Chloride 94 (*)    Glucose, Bld 149 (*)    BUN 27 (*)    GFR calc non Af Amer 55 (*)    All other components within normal limits   ____________________________________________  EKG ED ECG REPORT I, Arelia LongestSchaevitz,  Van Ehlert M, the attending physician, personally viewed and interpreted this ECG.   Date: 05/03/2015  EKG Time: 2254  Rate: 147  Rhythm: Atrial fibrillation with rapid ventricular response  Axis: Normal axis  Intervals:right bundle branch block  ST&T Change: ST elevation in aVR with depressions in V2 through 6.  Changes likely secondary to atrial fibrillation with rapid ventricular response.  ____________________________________________  RADIOLOGY  Subperiosteal, paraseptal orbital abscess on the right medial orbital wall adjacent to the right ethmoid air cells. No proptosis. ____________________________________________   PROCEDURES  CRITICAL CARE Performed by: Arelia LongestSchaevitz,  Euretha Najarro M   Total critical care time: 35 minutes  Critical care time was exclusive of separately billable procedures and treating other patients.  Critical care was necessary to treat or prevent imminent or life-threatening deterioration.  Critical care was time spent personally by me on the following activities: development of treatment plan with patient and/or surrogate as well as nursing, discussions with consultants, evaluation of patient's response to treatment, examination of patient, obtaining history from patient or surrogate, ordering and performing treatments and interventions, ordering and  review of laboratory studies, ordering and review of radiographic studies, pulse oximetry and re-evaluation of patient's condition.  Required multiple doses of IV medications for rapid ventricular response. ____________________________________________   INITIAL IMPRESSION / ASSESSMENT AND PLAN / ED COURSE  Pertinent labs & imaging results that were available during my care of the patient were reviewed by me and considered in my medical decision making (see chart for details).  ----------------------------------------- 11:36 PM on 05/03/2015 -----------------------------------------  Patient says that in CAT scan as she was lying back she became very anxious. She said she felt her heart racing. She says that she has had this before and has been in atrial fibrillation in the past. She sees Dr. Gwen PoundsKowalski and takes metoprolol. She also takes a daily aspirin. She does not take any other anticoagulants. At the time that she return to the room from CAT scan the patient said that she felt that she had to move her bowels. However, she said that she cannot do this without an enema. She was given a saline enema but was unable to pass any stool. She was moved back to the bed where she was found to be in rapid A. fib  in the 150s to 160s. Her blood pressure is high. She was given a dose of metoprolol with reduction of her heart rate to the 120s to 130s. She will be given a second dose and reassessed. She is not claiming any chest pain or shortness of breath this time.  I talked to the patient and her daughter and they are aware of the abscess on the CAT scan. The patient will be given IV antibiotics and admitted to the hospital. Discussed case with Dr. Julaine Fusi who says that ENT needs to be contacted for surgical consult for this procedure. He says that nobody in his office does this procedure. Dr. Zenda Alpers will follow up with her nose and throat and admit the patient. We'll also follow up with the patient's  heart rate and response to metoprolol. Possible that this patient will need to be transferred to a tertiary care center if ENT is unable to perform the surgery here. ____________________________________________   FINAL CLINICAL IMPRESSION(S) / ED DIAGNOSES  Right eye post septal abscess. Acute atrial fibrillation. Initial visit.    Myrna Blazer, MD 05/03/15 2343  Discussed case with Dr. Jenne Campus of ENT who recommends the patient be transferred to [facility] for definitive treatment. Says that his practice does not routinely do this procedure either.  Myrna Blazer, MD 05/03/15 2345  Of note, received call from ct tech that pt has contrast media allergy of itching.  Given iv benadryl prior to procedure.    Myrna Blazer, MD 05/04/15 [phone]

## 2015-05-03 NOTE — ED Notes (Signed)
Patient reports noticed pain to right eye on Friday morning.  Pt reports she has been taking antibiotics, but area is worse.  Right eye noted swelling and redness noted.

## 2015-05-04 ENCOUNTER — Other Ambulatory Visit: Payer: Self-pay

## 2015-05-04 DIAGNOSIS — H409 Unspecified glaucoma: Secondary | ICD-10-CM | POA: Insufficient documentation

## 2015-05-04 DIAGNOSIS — I4891 Unspecified atrial fibrillation: Secondary | ICD-10-CM | POA: Insufficient documentation

## 2015-05-04 DIAGNOSIS — I214 Non-ST elevation (NSTEMI) myocardial infarction: Secondary | ICD-10-CM | POA: Insufficient documentation

## 2015-05-04 MED ORDER — DIPHENHYDRAMINE HCL 50 MG/ML IJ SOLN
INTRAMUSCULAR | Status: AC
  Filled 2015-05-04: qty 1

## 2015-05-04 MED ORDER — DIPHENHYDRAMINE HCL 50 MG/ML IJ SOLN
25.0000 mg | Freq: Once | INTRAMUSCULAR | Status: AC
  Administered 2015-05-04: 25 mg via INTRAVENOUS

## 2015-05-04 MED ORDER — CEFTRIAXONE SODIUM IN DEXTROSE 20 MG/ML IV SOLN
INTRAVENOUS | Status: AC
  Administered 2015-05-04: 1000 mg
  Filled 2015-05-04: qty 50

## 2015-05-04 MED ORDER — DILTIAZEM HCL 30 MG PO TABS
60.0000 mg | ORAL_TABLET | Freq: Once | ORAL | Status: AC
  Administered 2015-05-04: 60 mg via ORAL

## 2015-05-04 MED ORDER — SODIUM CHLORIDE 0.9 % IV SOLN
1750.0000 mg | Freq: Once | INTRAVENOUS | Status: AC
  Administered 2015-05-04: 1750 mg via INTRAVENOUS
  Filled 2015-05-04: qty 1750

## 2015-05-04 MED ORDER — LORAZEPAM 2 MG/ML IJ SOLN
1.0000 mg | Freq: Once | INTRAMUSCULAR | Status: AC
  Administered 2015-05-04: 1 mg via INTRAVENOUS

## 2015-05-04 MED ORDER — DILTIAZEM HCL 25 MG/5ML IV SOLN: 10.0000 mg | Freq: Once | INTRAVENOUS | Status: DC

## 2015-05-04 MED ORDER — DILTIAZEM HCL 100 MG IV SOLR
5.0000 mg/h | INTRAVENOUS | Status: DC
  Administered 2015-05-04: 5 mg/h via INTRAVENOUS
  Filled 2015-05-04: qty 100

## 2015-05-04 MED ORDER — DILTIAZEM HCL 25 MG/5ML IV SOLN
10.0000 mg | Freq: Once | INTRAVENOUS | Status: AC
  Administered 2015-05-04: 10 mg via INTRAVENOUS

## 2015-05-04 MED ORDER — LORAZEPAM 2 MG/ML IJ SOLN
INTRAMUSCULAR | Status: AC
  Administered 2015-05-04: 1 mg via INTRAVENOUS
  Filled 2015-05-04: qty 1

## 2015-05-04 MED ORDER — METOPROLOL TARTRATE 1 MG/ML IV SOLN
10.0000 mg | Freq: Once | INTRAVENOUS | Status: AC
  Administered 2015-05-04: 10 mg via INTRAVENOUS

## 2015-05-04 MED ORDER — DILTIAZEM HCL 25 MG/5ML IV SOLN
INTRAVENOUS | Status: AC
  Administered 2015-05-04: 10 mg via INTRAVENOUS
  Filled 2015-05-04: qty 5

## 2015-05-04 MED ORDER — DILTIAZEM HCL 25 MG/5ML IV SOLN
15.0000 mg | Freq: Once | INTRAVENOUS | Status: AC
  Administered 2015-05-04: 15 mg via INTRAVENOUS

## 2015-05-04 MED ORDER — DILTIAZEM HCL 30 MG PO TABS
ORAL_TABLET | ORAL | Status: AC
  Administered 2015-05-04: 60 mg via ORAL
  Filled 2015-05-04: qty 2

## 2015-05-04 MED ORDER — METOPROLOL TARTRATE 1 MG/ML IV SOLN
INTRAVENOUS | Status: AC
  Filled 2015-05-04: qty 10

## 2015-05-04 NOTE — ED Provider Notes (Signed)
-----------------------------------------   3:13 AM on 05/04/2015 -----------------------------------------  Assuming care from Dr. Pershing ProudSchaevitz.  In short, Katherine [name] is a 81 y.o. female with a chief complaint of Eye Problem .  Refer to the original H&P for additional details.  The current plan of care is [facility] and attempt to control the patient's heart rate.  I discussed the patient's care [facility] He did accept the patient to the emergency department for evaluation of the abscess. I also spoke to Dr. Kizzie [facility]aneHughes [facility]. I discussed that I would attempt to control the patient's heart rate as she is in A. fib with rapid ventricular response in the 130s. Dr. Langston MaskerShaevitz had given 2 doses of metoprolol 5 mg to attempt to control the patient's heart rate but was unsuccessful.  I gave the patient 10 more milligrams of metoprolol and 1 mg of Ativan to determine if that would help to control her heart rate but again it was unsuccessful. The patient received her antibiotics and also received 10 mg of diltiazem followed by another 15 mg of diltiazem. When the patient continued to have an elevated heart rate in the 130s to 150s I placed the patient on a diltiazem drip. There were moments when the patient's heart rate improved into the 90s but it was not sustained and she remained back into the 140s. After the patient was placed on the drip I did perform an EKG as she did appear to have sustained heart rate. At that time the patient was found to be in sinus rhythm with sinus tachycardia. I stopped the patient's diltiazem and gave her 500 mL of normal saline. At that time the critical care transport truck did arrive to [facility]. The patient will be transferred for further treatment and evaluation. The patient's blood pressure did improve to the 120s over 80s.   ED ECG REPORT I, Rebecka ApleyWebster,  Allison P,  the attending physician, personally viewed and interpreted this ECG.   Date: 05/04/2015  EKG Time: 144  Rate: 92  Rhythm: atrial fibrillation, rate 92  Axis: Normal  Intervals:right bundle branch block  ST&T Change: Diffuse ST segment depression in leads 1, V1 V2, the 4 V5 V6.   Rebecka ApleyAllison P Webster, MD 05/04/15 [phone]

## 2015-05-04 NOTE — ED Notes (Signed)
Pharmacy called

## 2015-05-04 NOTE — ED Notes (Signed)
Report to Jesusita Okaan, RN carelink

## 2015-05-05 DIAGNOSIS — E119 Type 2 diabetes mellitus without complications: Secondary | ICD-10-CM | POA: Insufficient documentation

## 2015-05-06 ENCOUNTER — Telehealth: Payer: Self-pay | Admitting: Family Medicine

## 2015-05-06 MED ORDER — SPIRONOLACTONE 50 MG PO TABS: 50.0000 mg | ORAL_TABLET | Freq: Every day | ORAL | Status: DC

## 2015-05-06 NOTE — Telephone Encounter (Signed)
Increase spironolactone dose as pre previous office note.

## 2015-05-17 ENCOUNTER — Telehealth: Payer: Self-pay | Admitting: Family Medicine

## 2015-05-17 NOTE — Telephone Encounter (Signed)
error 

## 2015-05-26 ENCOUNTER — Ambulatory Visit: Payer: Self-pay | Admitting: Family Medicine

## 2015-06-04 DIAGNOSIS — R5383 Other fatigue: Secondary | ICD-10-CM | POA: Insufficient documentation

## 2015-06-04 DIAGNOSIS — R002 Palpitations: Secondary | ICD-10-CM | POA: Insufficient documentation

## 2015-06-04 DIAGNOSIS — I10 Essential (primary) hypertension: Secondary | ICD-10-CM | POA: Insufficient documentation

## 2015-06-04 DIAGNOSIS — I8390 Asymptomatic varicose veins of unspecified lower extremity: Secondary | ICD-10-CM | POA: Insufficient documentation

## 2015-06-04 DIAGNOSIS — I2510 Atherosclerotic heart disease of native coronary artery without angina pectoris: Secondary | ICD-10-CM | POA: Insufficient documentation

## 2015-06-04 DIAGNOSIS — L309 Dermatitis, unspecified: Secondary | ICD-10-CM | POA: Insufficient documentation

## 2015-06-04 DIAGNOSIS — E781 Pure hyperglyceridemia: Secondary | ICD-10-CM | POA: Insufficient documentation

## 2015-06-04 DIAGNOSIS — M2550 Pain in unspecified joint: Secondary | ICD-10-CM | POA: Insufficient documentation

## 2015-06-04 DIAGNOSIS — I509 Heart failure, unspecified: Secondary | ICD-10-CM

## 2015-06-04 DIAGNOSIS — M791 Myalgia, unspecified site: Secondary | ICD-10-CM | POA: Insufficient documentation

## 2015-06-04 DIAGNOSIS — R110 Nausea: Secondary | ICD-10-CM | POA: Insufficient documentation

## 2015-06-04 DIAGNOSIS — E559 Vitamin D deficiency, unspecified: Secondary | ICD-10-CM | POA: Insufficient documentation

## 2015-06-04 DIAGNOSIS — E871 Hypo-osmolality and hyponatremia: Secondary | ICD-10-CM | POA: Insufficient documentation

## 2015-06-04 DIAGNOSIS — R300 Dysuria: Secondary | ICD-10-CM | POA: Insufficient documentation

## 2015-06-04 DIAGNOSIS — M1990 Unspecified osteoarthritis, unspecified site: Secondary | ICD-10-CM | POA: Insufficient documentation

## 2015-06-04 HISTORY — DX: Heart failure, unspecified: I50.9

## 2015-06-08 ENCOUNTER — Ambulatory Visit: Payer: Self-pay | Admitting: Family Medicine

## 2015-06-19 ENCOUNTER — Telehealth: Payer: Self-pay | Admitting: Family Medicine

## 2015-06-19 NOTE — Telephone Encounter (Signed)
Pt daughter-in-law, Stanton Kidney is requesting a call back to discuss medications.  Pt is having a hard time/pt is feeling tired and weak.  Pt does not want to do any thing/can not function.  This has changed from 2 weeks ago.  Pt is not able to come in due to not being able to get around very good.  CB#[phone]/MW

## 2015-06-21 ENCOUNTER — Encounter: Payer: Self-pay | Admitting: Emergency Medicine

## 2015-06-21 ENCOUNTER — Inpatient Hospital Stay
Admission: EM | Admit: 2015-06-21 | Discharge: 2015-07-13 | DRG: 371 | Disposition: A | Discharge status: Skilled Nursing Facility | Payer: Medicare Other | Attending: Internal Medicine | Admitting: Internal Medicine

## 2015-06-21 ENCOUNTER — Emergency Department: Payer: Medicare Other

## 2015-06-21 DIAGNOSIS — A0472 Enterocolitis due to Clostridium difficile, not specified as recurrent: Secondary | ICD-10-CM | POA: Diagnosis present

## 2015-06-21 DIAGNOSIS — I4510 Unspecified right bundle-branch block: Secondary | ICD-10-CM | POA: Diagnosis present

## 2015-06-21 DIAGNOSIS — E559 Vitamin D deficiency, unspecified: Secondary | ICD-10-CM | POA: Diagnosis present

## 2015-06-21 DIAGNOSIS — E1165 Type 2 diabetes mellitus with hyperglycemia: Secondary | ICD-10-CM | POA: Diagnosis present

## 2015-06-21 DIAGNOSIS — I5043 Acute on chronic combined systolic (congestive) and diastolic (congestive) heart failure: Secondary | ICD-10-CM | POA: Diagnosis present

## 2015-06-21 DIAGNOSIS — E876 Hypokalemia: Secondary | ICD-10-CM | POA: Diagnosis present

## 2015-06-21 DIAGNOSIS — K567 Ileus, unspecified: Secondary | ICD-10-CM

## 2015-06-21 DIAGNOSIS — Z882 Allergy status to sulfonamides status: Secondary | ICD-10-CM

## 2015-06-21 DIAGNOSIS — I129 Hypertensive chronic kidney disease with stage 1 through stage 4 chronic kidney disease, or unspecified chronic kidney disease: Secondary | ICD-10-CM | POA: Diagnosis present

## 2015-06-21 DIAGNOSIS — Y92009 Unspecified place in unspecified non-institutional (private) residence as the place of occurrence of the external cause: Secondary | ICD-10-CM

## 2015-06-21 DIAGNOSIS — J189 Pneumonia, unspecified organism: Secondary | ICD-10-CM

## 2015-06-21 DIAGNOSIS — Z9889 Other specified postprocedural states: Secondary | ICD-10-CM

## 2015-06-21 DIAGNOSIS — J9811 Atelectasis: Secondary | ICD-10-CM | POA: Clinically undetermined

## 2015-06-21 DIAGNOSIS — R609 Edema, unspecified: Secondary | ICD-10-CM

## 2015-06-21 DIAGNOSIS — R079 Chest pain, unspecified: Secondary | ICD-10-CM

## 2015-06-21 DIAGNOSIS — B370 Candidal stomatitis: Secondary | ICD-10-CM | POA: Diagnosis present

## 2015-06-21 DIAGNOSIS — E222 Syndrome of inappropriate secretion of antidiuretic hormone: Secondary | ICD-10-CM | POA: Diagnosis present

## 2015-06-21 DIAGNOSIS — R739 Hyperglycemia, unspecified: Secondary | ICD-10-CM

## 2015-06-21 DIAGNOSIS — E871 Hypo-osmolality and hyponatremia: Secondary | ICD-10-CM | POA: Clinically undetermined

## 2015-06-21 DIAGNOSIS — Z888 Allergy status to other drugs, medicaments and biological substances status: Secondary | ICD-10-CM | POA: Diagnosis not present

## 2015-06-21 DIAGNOSIS — Z955 Presence of coronary angioplasty implant and graft: Secondary | ICD-10-CM

## 2015-06-21 DIAGNOSIS — T501X5A Adverse effect of loop [high-ceiling] diuretics, initial encounter: Secondary | ICD-10-CM | POA: Diagnosis present

## 2015-06-21 DIAGNOSIS — I10 Essential (primary) hypertension: Secondary | ICD-10-CM

## 2015-06-21 DIAGNOSIS — K219 Gastro-esophageal reflux disease without esophagitis: Secondary | ICD-10-CM | POA: Diagnosis present

## 2015-06-21 DIAGNOSIS — A047 Enterocolitis due to Clostridium difficile: Secondary | ICD-10-CM | POA: Diagnosis present

## 2015-06-21 DIAGNOSIS — Z880 Allergy status to penicillin: Secondary | ICD-10-CM

## 2015-06-21 DIAGNOSIS — Z90710 Acquired absence of both cervix and uterus: Secondary | ICD-10-CM | POA: Diagnosis not present

## 2015-06-21 DIAGNOSIS — M25529 Pain in unspecified elbow: Secondary | ICD-10-CM | POA: Diagnosis present

## 2015-06-21 DIAGNOSIS — N183 Chronic kidney disease, stage 3 (moderate): Secondary | ICD-10-CM | POA: Diagnosis present

## 2015-06-21 DIAGNOSIS — E781 Pure hyperglyceridemia: Secondary | ICD-10-CM | POA: Diagnosis present

## 2015-06-21 DIAGNOSIS — R7989 Other specified abnormal findings of blood chemistry: Secondary | ICD-10-CM | POA: Diagnosis present

## 2015-06-21 DIAGNOSIS — B964 Proteus (mirabilis) (morganii) as the cause of diseases classified elsewhere: Secondary | ICD-10-CM | POA: Diagnosis present

## 2015-06-21 DIAGNOSIS — Z6837 Body mass index (BMI) 37.0-37.9, adult: Secondary | ICD-10-CM | POA: Diagnosis not present

## 2015-06-21 DIAGNOSIS — I2510 Atherosclerotic heart disease of native coronary artery without angina pectoris: Secondary | ICD-10-CM | POA: Diagnosis present

## 2015-06-21 DIAGNOSIS — J9600 Acute respiratory failure, unspecified whether with hypoxia or hypercapnia: Secondary | ICD-10-CM | POA: Diagnosis present

## 2015-06-21 DIAGNOSIS — R9431 Abnormal electrocardiogram [ECG] [EKG]: Secondary | ICD-10-CM | POA: Diagnosis not present

## 2015-06-21 DIAGNOSIS — A419 Sepsis, unspecified organism: Secondary | ICD-10-CM | POA: Diagnosis present

## 2015-06-21 DIAGNOSIS — R109 Unspecified abdominal pain: Secondary | ICD-10-CM

## 2015-06-21 DIAGNOSIS — E669 Obesity, unspecified: Secondary | ICD-10-CM | POA: Diagnosis present

## 2015-06-21 DIAGNOSIS — D649 Anemia, unspecified: Secondary | ICD-10-CM | POA: Diagnosis present

## 2015-06-21 DIAGNOSIS — Z8249 Family history of ischemic heart disease and other diseases of the circulatory system: Secondary | ICD-10-CM

## 2015-06-21 DIAGNOSIS — I4891 Unspecified atrial fibrillation: Secondary | ICD-10-CM | POA: Diagnosis present

## 2015-06-21 DIAGNOSIS — N390 Urinary tract infection, site not specified: Secondary | ICD-10-CM | POA: Diagnosis present

## 2015-06-21 DIAGNOSIS — J9601 Acute respiratory failure with hypoxia: Secondary | ICD-10-CM | POA: Diagnosis not present

## 2015-06-21 DIAGNOSIS — Z809 Family history of malignant neoplasm, unspecified: Secondary | ICD-10-CM | POA: Diagnosis not present

## 2015-06-21 DIAGNOSIS — Z9049 Acquired absence of other specified parts of digestive tract: Secondary | ICD-10-CM | POA: Diagnosis present

## 2015-06-21 DIAGNOSIS — E11649 Type 2 diabetes mellitus with hypoglycemia without coma: Secondary | ICD-10-CM

## 2015-06-21 DIAGNOSIS — W19XXXA Unspecified fall, initial encounter: Secondary | ICD-10-CM | POA: Insufficient documentation

## 2015-06-21 DIAGNOSIS — I252 Old myocardial infarction: Secondary | ICD-10-CM | POA: Diagnosis not present

## 2015-06-21 DIAGNOSIS — N179 Acute kidney failure, unspecified: Secondary | ICD-10-CM | POA: Diagnosis present

## 2015-06-21 DIAGNOSIS — M1990 Unspecified osteoarthritis, unspecified site: Secondary | ICD-10-CM | POA: Diagnosis present

## 2015-06-21 DIAGNOSIS — R140 Abdominal distension (gaseous): Secondary | ICD-10-CM | POA: Diagnosis not present

## 2015-06-21 DIAGNOSIS — W010XXA Fall on same level from slipping, tripping and stumbling without subsequent striking against object, initial encounter: Secondary | ICD-10-CM | POA: Diagnosis present

## 2015-06-21 DIAGNOSIS — E869 Volume depletion, unspecified: Secondary | ICD-10-CM | POA: Diagnosis present

## 2015-06-21 DIAGNOSIS — E46 Unspecified protein-calorie malnutrition: Secondary | ICD-10-CM | POA: Diagnosis present

## 2015-06-21 DIAGNOSIS — J90 Pleural effusion, not elsewhere classified: Secondary | ICD-10-CM | POA: Clinically undetermined

## 2015-06-21 DIAGNOSIS — I482 Chronic atrial fibrillation: Secondary | ICD-10-CM | POA: Diagnosis not present

## 2015-06-21 LAB — COMPREHENSIVE METABOLIC PANEL
ALBUMIN: 2.5 g/dL — AB (ref 3.5–5.0)
ALT: 12 U/L — ABNORMAL LOW (ref 14–54)
AST: 16 U/L (ref 15–41)
Alkaline Phosphatase: 89 U/L (ref 38–126)
Anion gap: 12 (ref 5–15)
BUN: 32 mg/dL — AB (ref 6–20)
CALCIUM: 8.3 mg/dL — AB (ref 8.9–10.3)
CO2: 24 mmol/L (ref 22–32)
CREATININE: 1.29 mg/dL — AB (ref 0.44–1.00)
Chloride: 86 mmol/L — ABNORMAL LOW (ref 101–111)
GFR calc Af Amer: 44 mL/min — ABNORMAL LOW (ref >60)
GFR calc non Af Amer: 38 mL/min — ABNORMAL LOW (ref >60)
Glucose, Bld: 225 mg/dL — ABNORMAL HIGH (ref 65–99)
Potassium: 3.6 mmol/L (ref 3.5–5.1)
Sodium: 122 mmol/L — ABNORMAL LOW (ref 135–145)
TOTAL PROTEIN: 5.3 g/dL — AB (ref 6.5–8.1)
Total Bilirubin: 0.4 mg/dL (ref 0.3–1.2)

## 2015-06-21 LAB — GLUCOSE, CAPILLARY: Glucose-Capillary: 176 mg/dL — ABNORMAL HIGH (ref 65–99)

## 2015-06-21 LAB — CBC WITH DIFFERENTIAL/PLATELET
Basophils Absolute: 0 10*3/uL (ref 0–0.1)
Basophils Relative: 0 %
EOS ABS: 0 10*3/uL (ref 0–0.7)
EOS PCT: 0 %
HEMATOCRIT: 25 % — AB (ref 35.0–47.0)
Hemoglobin: 8.1 g/dL — ABNORMAL LOW (ref 12.0–16.0)
LYMPHS ABS: 1.2 10*3/uL (ref 1.0–3.6)
LYMPHS PCT: 6 %
MCH: 27.7 pg (ref 26.0–34.0)
MCHC: 32.4 g/dL (ref 32.0–36.0)
MCV: 85.7 fL (ref 80.0–100.0)
MONO ABS: 1.6 10*3/uL — AB (ref 0.2–0.9)
MONOS PCT: 7 %
Neutro Abs: 19.5 10*3/uL — ABNORMAL HIGH (ref 1.4–6.5)
Neutrophils Relative %: 87 %
PLATELETS: 393 10*3/uL (ref 150–440)
RBC: 2.91 MIL/uL — AB (ref 3.80–5.20)
RDW: 14.9 % — ABNORMAL HIGH (ref 11.5–14.5)
WBC: 22.5 10*3/uL — AB (ref 3.6–11.0)

## 2015-06-21 LAB — URINALYSIS COMPLETE WITH MICROSCOPIC (ARMC ONLY)
BILIRUBIN URINE: NEGATIVE
Glucose, UA: 150 mg/dL — AB
HGB URINE DIPSTICK: NEGATIVE
KETONES UR: NEGATIVE mg/dL
Leukocytes, UA: NEGATIVE
Nitrite: NEGATIVE
PH: 5 (ref 5.0–8.0)
Protein, ur: 100 mg/dL — AB
Specific Gravity, Urine: 1.011 (ref 1.005–1.030)

## 2015-06-21 LAB — C DIFFICILE QUICK SCREEN W PCR REFLEX
C DIFFICILE (CDIFF) INTERP: POSITIVE
C Diff antigen: POSITIVE — AB
C Diff toxin: POSITIVE — AB

## 2015-06-21 LAB — BRAIN NATRIURETIC PEPTIDE: B Natriuretic Peptide: 901 pg/mL — ABNORMAL HIGH (ref 0.0–100.0)

## 2015-06-21 MED ORDER — BENAZEPRIL HCL 20 MG PO TABS
40.0000 mg | ORAL_TABLET | Freq: Two times a day (BID) | ORAL | Status: DC
  Administered 2015-06-22 – 2015-07-13 (×43): 40 mg via ORAL
  Filled 2015-06-21 (×45): qty 2

## 2015-06-21 MED ORDER — LATANOPROST 0.005 % OP SOLN
1.0000 [drp] | Freq: Every evening | OPHTHALMIC | Status: DC
  Administered 2015-06-21 – 2015-07-12 (×22): 1 [drp] via OPHTHALMIC
  Filled 2015-06-21 (×2): qty 2.5

## 2015-06-21 MED ORDER — ASPIRIN EC 81 MG PO TBEC
81.0000 mg | DELAYED_RELEASE_TABLET | Freq: Every day | ORAL | Status: DC
  Administered 2015-06-22 – 2015-06-29 (×9): 81 mg via ORAL
  Filled 2015-06-21 (×9): qty 1

## 2015-06-21 MED ORDER — METRONIDAZOLE IN NACL 5-0.79 MG/ML-% IV SOLN
500.0000 mg | Freq: Four times a day (QID) | INTRAVENOUS | Status: DC
  Administered 2015-06-22 – 2015-06-23 (×6): 500 mg via INTRAVENOUS
  Filled 2015-06-21 (×12): qty 100

## 2015-06-21 MED ORDER — METOPROLOL TARTRATE 25 MG PO TABS
25.0000 mg | ORAL_TABLET | Freq: Two times a day (BID) | ORAL | Status: DC
  Administered 2015-06-22 (×2): 25 mg via ORAL
  Filled 2015-06-21 (×2): qty 1

## 2015-06-21 MED ORDER — INSULIN ASPART 100 UNIT/ML ~~LOC~~ SOLN
0.0000 [IU] | Freq: Every day | SUBCUTANEOUS | Status: DC
  Administered 2015-06-24: 2 [IU] via SUBCUTANEOUS
  Administered 2015-06-27: 5 [IU] via SUBCUTANEOUS
  Administered 2015-06-28: 3 [IU] via SUBCUTANEOUS
  Administered 2015-06-30 – 2015-07-02 (×3): 2 [IU] via SUBCUTANEOUS
  Administered 2015-07-04: 3 [IU] via SUBCUTANEOUS
  Administered 2015-07-05 – 2015-07-07 (×3): 2 [IU] via SUBCUTANEOUS
  Administered 2015-07-08: 3 [IU] via SUBCUTANEOUS
  Administered 2015-07-09 – 2015-07-11 (×3): 2 [IU] via SUBCUTANEOUS
  Administered 2015-07-12: 3 [IU] via SUBCUTANEOUS
  Filled 2015-06-21: qty 2
  Filled 2015-06-21 (×2): qty 3
  Filled 2015-06-21 (×2): qty 5
  Filled 2015-06-21: qty 3
  Filled 2015-06-21: qty 2
  Filled 2015-06-21: qty 3
  Filled 2015-06-21 (×2): qty 2
  Filled 2015-06-21: qty 5
  Filled 2015-06-21: qty 3
  Filled 2015-06-21 (×5): qty 2

## 2015-06-21 MED ORDER — HYDRALAZINE HCL 50 MG PO TABS
200.0000 mg | ORAL_TABLET | Freq: Every day | ORAL | Status: DC
  Administered 2015-06-22: 200 mg via ORAL
  Filled 2015-06-21 (×2): qty 4

## 2015-06-21 MED ORDER — PRAVASTATIN SODIUM 10 MG PO TABS: 10.0000 mg | ORAL_TABLET | Freq: Every day | ORAL | Status: DC

## 2015-06-21 MED ORDER — INSULIN ASPART 100 UNIT/ML ~~LOC~~ SOLN
0.0000 [IU] | Freq: Three times a day (TID) | SUBCUTANEOUS | Status: DC
  Administered 2015-06-22 – 2015-06-23 (×3): 2 [IU] via SUBCUTANEOUS
  Administered 2015-06-23: 3 [IU] via SUBCUTANEOUS
  Administered 2015-06-23: 5 [IU] via SUBCUTANEOUS
  Administered 2015-06-24: 8 [IU] via SUBCUTANEOUS
  Administered 2015-06-24: 3 [IU] via SUBCUTANEOUS
  Administered 2015-06-24: 8 [IU] via SUBCUTANEOUS
  Administered 2015-06-25 – 2015-06-26 (×3): 3 [IU] via SUBCUTANEOUS
  Administered 2015-06-27: 5 [IU] via SUBCUTANEOUS
  Administered 2015-06-27: 11 [IU] via SUBCUTANEOUS
  Administered 2015-06-28: 15 [IU] via SUBCUTANEOUS
  Administered 2015-06-28: 5 [IU] via SUBCUTANEOUS
  Administered 2015-06-28: 15 [IU] via SUBCUTANEOUS
  Administered 2015-06-29: 8 [IU] via SUBCUTANEOUS
  Administered 2015-06-29: 3 [IU] via SUBCUTANEOUS
  Administered 2015-06-29: 5 [IU] via SUBCUTANEOUS
  Administered 2015-06-30 (×2): 3 [IU] via SUBCUTANEOUS
  Administered 2015-06-30 – 2015-07-01 (×2): 5 [IU] via SUBCUTANEOUS
  Administered 2015-07-01: 3 [IU] via SUBCUTANEOUS
  Administered 2015-07-01: 8 [IU] via SUBCUTANEOUS
  Administered 2015-07-02: 5 [IU] via SUBCUTANEOUS
  Administered 2015-07-02: 3 [IU] via SUBCUTANEOUS
  Administered 2015-07-02: 5 [IU] via SUBCUTANEOUS
  Administered 2015-07-03: 3 [IU] via SUBCUTANEOUS
  Administered 2015-07-03: 4 [IU] via SUBCUTANEOUS
  Administered 2015-07-03 – 2015-07-04 (×4): 3 [IU] via SUBCUTANEOUS
  Administered 2015-07-05: 2 [IU] via SUBCUTANEOUS
  Administered 2015-07-05 – 2015-07-06 (×3): 3 [IU] via SUBCUTANEOUS
  Administered 2015-07-07: 8 [IU] via SUBCUTANEOUS
  Administered 2015-07-07: 5 [IU] via SUBCUTANEOUS
  Administered 2015-07-07 – 2015-07-08 (×2): 3 [IU] via SUBCUTANEOUS
  Administered 2015-07-08 – 2015-07-09 (×3): 5 [IU] via SUBCUTANEOUS
  Administered 2015-07-09: 3 [IU] via SUBCUTANEOUS
  Administered 2015-07-09: 5 [IU] via SUBCUTANEOUS
  Administered 2015-07-10: 2 [IU] via SUBCUTANEOUS
  Administered 2015-07-10: 5 [IU] via SUBCUTANEOUS
  Administered 2015-07-11: 8 [IU] via SUBCUTANEOUS
  Administered 2015-07-11: 5 [IU] via SUBCUTANEOUS
  Administered 2015-07-11: 3 [IU] via SUBCUTANEOUS
  Administered 2015-07-12: 8 [IU] via SUBCUTANEOUS
  Administered 2015-07-12: 3 [IU] via SUBCUTANEOUS
  Administered 2015-07-12: 5 [IU] via SUBCUTANEOUS
  Administered 2015-07-13: 8 [IU] via SUBCUTANEOUS
  Filled 2015-06-21: qty 8
  Filled 2015-06-21: qty 5
  Filled 2015-06-21 (×2): qty 3
  Filled 2015-06-21: qty 8
  Filled 2015-06-21 (×3): qty 3
  Filled 2015-06-21: qty 5
  Filled 2015-06-21: qty 3
  Filled 2015-06-21: qty 11
  Filled 2015-06-21: qty 8
  Filled 2015-06-21: qty 5
  Filled 2015-06-21: qty 15
  Filled 2015-06-21 (×2): qty 5
  Filled 2015-06-21: qty 2
  Filled 2015-06-21: qty 5
  Filled 2015-06-21: qty 3
  Filled 2015-06-21 (×2): qty 5
  Filled 2015-06-21: qty 3
  Filled 2015-06-21: qty 5
  Filled 2015-06-21 (×3): qty 3
  Filled 2015-06-21: qty 2
  Filled 2015-06-21: qty 3
  Filled 2015-06-21: qty 5
  Filled 2015-06-21: qty 3
  Filled 2015-06-21: qty 8
  Filled 2015-06-21: qty 5
  Filled 2015-06-21: qty 2
  Filled 2015-06-21: qty 3
  Filled 2015-06-21: qty 5
  Filled 2015-06-21: qty 8
  Filled 2015-06-21: qty 4
  Filled 2015-06-21: qty 2
  Filled 2015-06-21 (×3): qty 3
  Filled 2015-06-21: qty 5
  Filled 2015-06-21: qty 8
  Filled 2015-06-21: qty 3
  Filled 2015-06-21: qty 8
  Filled 2015-06-21 (×2): qty 5
  Filled 2015-06-21 (×2): qty 3
  Filled 2015-06-21: qty 15
  Filled 2015-06-21: qty 2
  Filled 2015-06-21: qty 5
  Filled 2015-06-21: qty 8
  Filled 2015-06-21: qty 5

## 2015-06-21 MED ORDER — OXYCODONE-ACETAMINOPHEN 5-325 MG PO TABS
2.0000 | ORAL_TABLET | Freq: Once | ORAL | Status: AC
  Administered 2015-06-21: 2 via ORAL
  Filled 2015-06-21: qty 2

## 2015-06-21 MED ORDER — IRBESARTAN 75 MG PO TABS
37.5000 mg | ORAL_TABLET | Freq: Every day | ORAL | Status: DC
  Filled 2015-06-21: qty 1

## 2015-06-21 MED ORDER — ESOMEPRAZOLE MAGNESIUM 40 MG PO PACK: 40.0000 mg | PACK | Freq: Every day | ORAL | Status: DC

## 2015-06-21 MED ORDER — GLIPIZIDE ER 5 MG PO TB24
5.0000 mg | ORAL_TABLET | Freq: Every day | ORAL | Status: DC
Start: 2015-06-22 — End: 2015-06-26
  Administered 2015-06-22 – 2015-06-25 (×4): 5 mg via ORAL
  Filled 2015-06-21 (×4): qty 2

## 2015-06-21 MED ORDER — HYDROCORTISONE 1 % EX CREA
TOPICAL_CREAM | Freq: Two times a day (BID) | CUTANEOUS | Status: DC
  Administered 2015-06-21 – 2015-06-22 (×2): via TOPICAL
  Filled 2015-06-21: qty 28

## 2015-06-21 MED ORDER — PANTOPRAZOLE SODIUM 40 MG PO TBEC
80.0000 mg | DELAYED_RELEASE_TABLET | Freq: Every day | ORAL | Status: DC
  Administered 2015-06-22: 80 mg via ORAL
  Filled 2015-06-21: qty 2

## 2015-06-21 MED ORDER — DORZOLAMIDE HCL-TIMOLOL MAL 2-0.5 % OP SOLN
1.0000 [drp] | Freq: Two times a day (BID) | OPHTHALMIC | Status: DC
  Administered 2015-06-21 – 2015-06-22 (×2): 1 [drp] via OPHTHALMIC
  Filled 2015-06-21: qty 10

## 2015-06-21 MED ORDER — ADULT MULTIVITAMIN W/MINERALS CH
1.0000 | ORAL_TABLET | Freq: Every morning | ORAL | Status: DC
  Administered 2015-06-22 – 2015-07-13 (×20): 1 via ORAL
  Filled 2015-06-21 (×22): qty 1

## 2015-06-21 MED ORDER — SODIUM CHLORIDE 0.9 % IV SOLN
INTRAVENOUS | Status: DC
Start: 2015-06-21 — End: 2015-06-22
  Administered 2015-06-21 – 2015-06-22 (×2): via INTRAVENOUS

## 2015-06-21 MED ORDER — METRONIDAZOLE 500 MG PO TABS
500.0000 mg | ORAL_TABLET | Freq: Once | ORAL | Status: AC
  Administered 2015-06-21: 500 mg via ORAL
  Filled 2015-06-21: qty 1

## 2015-06-21 MED ORDER — INSULIN GLARGINE 100 UNIT/ML ~~LOC~~ SOLN
46.0000 [IU] | Freq: Every evening | SUBCUTANEOUS | Status: DC
  Administered 2015-06-21 – 2015-06-24 (×4): 46 [IU] via SUBCUTANEOUS
  Filled 2015-06-21 (×6): qty 0.46

## 2015-06-21 NOTE — H&P (Signed)
Katherine [name] is an 81 y.o. female.   Chief Complaint: Diarrhea  HPI: Pt was in hospital at [facility] recently for eye infection and heart problems. Discharged to rehab where she receive IV abx. Started having diarrhea before being discharged from rehab. Today fell because she was weak and was brought to hospital, Stool tested positive for c. Dif.  Past Medical History  Diagnosis Date  . Diabetes mellitus without complication   . Hypertension   . CCF (congestive cardiac failure) 06/04/2015  . Varicose veins     of leg  . Bowel habit changes   . Anemia   . Diabetes mellitus with renal manifestation 03/19/2009  . Vitamin D deficiency   . Hyperglyceridemia, pure   . Hyperlipidemia 02/01/2010  . Hyponatremia   . Mitral valve disorder 04/01/2009  . Atherosclerosis of coronary artery 08/04/2009  . CHF (congestive heart failure)     EF 30-35% on echo 2014  . Allergy 04/04/2009  . GERD (gastroesophageal reflux disease) 11/06/2009  . Hemorrhoids, external without complications 18/56/3149  . Arthritis   . Arthralgia   . Myalgia   . Palpitations   . Chronic nausea   . Malaise and fatigue   . Edema   . Atrial fibrillation 04/01/2009  . Dysuria     Past Surgical History  Procedure Laterality Date  . Tonsillectomy    . Breast surgery    . Abdominal hysterectomy    . Appendectomy    . Hernia repair    . Cholecystectomy    . Stents  2011    Family History  Problem Relation Age of Onset  . Heart attack Mother   . Cancer Father    Social History:  reports that she has never smoked. She does not have any smokeless tobacco history on file. She reports that she does not drink alcohol. Her drug history is not on file.  Allergies:  Allergies  Allergen Reactions  . Benzocaine Other (See Comments)    Unknown reaction  . Contrast Media [Iodinated Diagnostic Agents] Other (See Comments)    Tachycardia, SVT  . Other Other (See Comments)    MRI dye--reaction unknown.  Marland Kitchen Penicillin V  Potassium Other (See Comments)  . Penicillins Other (See Comments)    Reaction: pt doesn't know, b/c it's been a long time ago.   . Sulfa Antibiotics Other (See Comments)    Reaction: pt doesn't know, b/c it's been a long time ago.      (Not in a hospital admission)  Results for orders placed or performed during the hospital encounter of 06/21/15 (from the past 48 hour(s))  CBC with Differential/Platelet     Status: Abnormal   Collection Time: 06/21/15  3:38 PM  Result Value Ref Range   WBC 22.5 (H) 3.6 - 11.0 K/uL   RBC 2.91 (L) 3.80 - 5.20 MIL/uL   Hemoglobin 8.1 (L) 12.0 - 16.0 g/dL   HCT 25.0 (L) 35.0 - 47.0 %   MCV 85.7 80.0 - 100.0 fL   MCH 27.7 26.0 - 34.0 pg   MCHC 32.4 32.0 - 36.0 g/dL   RDW 14.9 (H) 11.5 - 14.5 %   Platelets 393 150 - 440 K/uL   Neutrophils Relative % 87 %   Neutro Abs 19.5 (H) 1.4 - 6.5 K/uL   Lymphocytes Relative 6 %   Lymphs Abs 1.2 1.0 - 3.6 K/uL   Monocytes Relative 7 %   Monocytes Absolute 1.6 (H) 0.2 - 0.9 K/uL   Eosinophils  Relative 0 %   Eosinophils Absolute 0.0 0 - 0.7 K/uL   Basophils Relative 0 %   Basophils Absolute 0.0 0 - 0.1 K/uL  Comprehensive metabolic panel     Status: Abnormal   Collection Time: 06/21/15  3:38 PM  Result Value Ref Range   Sodium 122 (L) 135 - 145 mmol/L   Potassium 3.6 3.5 - 5.1 mmol/L   Chloride 86 (L) 101 - 111 mmol/L   CO2 24 22 - 32 mmol/L   Glucose, Bld 225 (H) 65 - 99 mg/dL   BUN 32 (H) 6 - 20 mg/dL   Creatinine, Ser 1.29 (H) 0.44 - 1.00 mg/dL   Calcium 8.3 (L) 8.9 - 10.3 mg/dL   Total Protein 5.3 (L) 6.5 - 8.1 g/dL   Albumin 2.5 (L) 3.5 - 5.0 g/dL   AST 16 15 - 41 U/L   ALT 12 (L) 14 - 54 U/L   Alkaline Phosphatase 89 38 - 126 U/L   Total Bilirubin 0.4 0.3 - 1.2 mg/dL   GFR calc non Af Amer 38 (L) >60 mL/min   GFR calc Af Amer 44 (L) >60 mL/min    Comment: (NOTE) The eGFR has been calculated using the CKD EPI equation. This calculation has not been validated in all clinical situations. eGFR's  persistently <60 mL/min signify possible Chronic Kidney Disease.    Anion gap 12 5 - 15  Brain natriuretic peptide     Status: Abnormal   Collection Time: 06/21/15  3:38 PM  Result Value Ref Range   B Natriuretic Peptide 901.0 (H) 0.0 - 100.0 pg/mL  Urinalysis complete, with microscopic ([facility] only)     Status: Abnormal   Collection Time: 06/21/15  5:17 PM  Result Value Ref Range   Color, Urine YELLOW (A) YELLOW   APPearance HAZY (A) CLEAR   Glucose, UA 150 (A) NEGATIVE mg/dL   Bilirubin Urine NEGATIVE NEGATIVE   Ketones, ur NEGATIVE NEGATIVE mg/dL   Specific Gravity, Urine 1.011 1.005 - 1.030   Hgb urine dipstick NEGATIVE NEGATIVE   pH 5.0 5.0 - 8.0   Protein, ur 100 (A) NEGATIVE mg/dL   Nitrite NEGATIVE NEGATIVE   Leukocytes, UA NEGATIVE NEGATIVE   RBC / HPF 0-5 0 - 5 RBC/hpf   WBC, UA 0-5 0 - 5 WBC/hpf   Bacteria, UA RARE (A) NONE SEEN   Squamous Epithelial / LPF 0-5 (A) NONE SEEN   Mucous PRESENT    Hyaline Casts, UA PRESENT   C difficile quick scan w PCR reflex ([facility] only)     Status: Abnormal   Collection Time: 06/21/15  5:17 PM  Result Value Ref Range   C Diff antigen POSITIVE (A) NEGATIVE   C Diff toxin POSITIVE (A) NEGATIVE   C Diff interpretation      Positive for toxigenic C. difficile, active toxin production present.    Comment: CRITICAL RESULT CALLED TO, READ BACK BY AND VERIFIED WITH: DONALD SWEENEY ON 06/21/15 AT 1755 BY JEF    Dg Elbow Complete Left  06/21/2015   CLINICAL DATA:  Patient fell at home with resulting left elbow pain  EXAM: LEFT ELBOW - COMPLETE 3+ VIEW  COMPARISON:  None.  FINDINGS: There is no dislocation. Fracture is not definitively identified. There is mild elevation of the anterior fat pad however.  IMPRESSION: There may be a small elbow joint effusion which implies in the setting of trauma the possibility of a nondisplaced radiographically occult fracture. Correlate with degree of suspicion on clinical  examination and consider further imaging  with CT scan if indicated. Alternatively if symptoms persist centered radiographic followup in about 4-7 days.   Electronically Signed   By: Skipper Cliche M.D.   On: 06/21/2015 16:12   Dg Knee 2 Views Left  06/21/2015   CLINICAL DATA:  Fall, left knee pain  EXAM: LEFT KNEE - 1-2 VIEW  COMPARISON:  None.  FINDINGS: No acute fracture or dislocation. No lytic or sclerotic osseous lesion. No joint effusion. 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 Peripheral vascular atherosclerotic disease.  IMPRESSION: No acute osseous injury of the left knee.   Electronically Signed   By: Kathreen Devoid   On: 06/21/2015 16:04   Dg Hip Unilat With Pelvis 2-3 Views Left  06/21/2015   CLINICAL DATA:  Fall  EXAM: DG HIP (WITH OR WITHOUT PELVIS) 2-3V LEFT  COMPARISON:  None.  FINDINGS: There is no evidence of hip fracture or dislocation. There is generalized osteopenia. There is no evidence of arthropathy or other focal bone abnormality. There is peripheral vascular atherosclerotic disease.  IMPRESSION: No acute osseous injury of the left hip.   Electronically Signed   By: Kathreen Devoid   On: 06/21/2015 16:04    Review of Systems  Constitutional: Negative for fever and weight loss.  HENT: Negative for hearing loss and sore throat.   Eyes: Negative for blurred vision.  Respiratory: Negative for cough and shortness of breath.   Cardiovascular: Positive for leg swelling. Negative for chest pain.  Gastrointestinal: Positive for diarrhea. Negative for nausea and vomiting.  Genitourinary: Negative for dysuria and hematuria.  Musculoskeletal: Positive for falls. Negative for back pain.  Skin: Negative for rash.  Neurological: Positive for weakness. Negative for dizziness and focal weakness.    Blood pressure 143/65, pulse 70, temperature 98.3 F (36.8 C), temperature source Oral, resp. rate 20, height _0  (1.6 m), weight 96.616 kg (213 lb), SpO2 98 %. Physical Exam  Constitutional: She is oriented to  person, place, and time. She appears well-developed.  Ill appearing  HENT:  Head: Atraumatic.  Mouth/Throat: No oropharyngeal exudate.  Oral mucosa dry  Eyes: EOM are normal. Pupils are equal, round, and reactive to light. No scleral icterus.  Neck: No JVD present. No tracheal deviation present. No thyromegaly present.  Cardiovascular: Normal rate.   No murmur heard. Respiratory: Effort normal. No respiratory distress. She exhibits no tenderness.  GI: Soft. Bowel sounds are normal. She exhibits no mass. There is no tenderness.  Musculoskeletal: She exhibits edema. She exhibits no tenderness.  Lymphadenopathy:    She has no cervical adenopathy.  Neurological: She is alert and oriented to person, place, and time. No cranial nerve deficit.  Skin: Skin is warm and dry. No rash noted.     Assessment/Plan 1. C. Diff: Collitis: Will start PO flagyl as long as she can tolerate PO.  2. Acute Renal failure: Secondary to fluid loss from diarrhea. Hold diuretics and give IVF.  3. Hyponatremia: Secondary to fluid loss. IV NS. Recheck in am.  4. Leukocytosis: Sec to c.dif.  Time spent= 45 min  Baxter Hire 06/21/2015, 7:04 PM

## 2015-06-21 NOTE — ED Provider Notes (Addendum)
[facility] Emergency Department Provider Note     Time seen: ----------------------------------------- 2:59 PM on 06/21/2015 -----------------------------------------    I have reviewed the triage vital signs and the nursing notes.   HISTORY  Chief Complaint No chief complaint on file.    HPI Katherine [name] is a 81 y.o. female brought from home after a fall. Patient states she just left rehabilitation about a week ago. Patient has had several falls since going home. States she tripped and fell on her left side. Complains to her left knee left hip and left elbow. Denies hitting her head with this fall. Denies fevers chills or other complaints. Does note she has swelling but it is at her baseline. Pain is moderate in the knee and hip and elbow, movement makes it worse. Patient with frequent diarrhea as well, that she states has been persistent since she has been on oral antibiotics for a leg and facial infection.   Past Medical History  Diagnosis Date  . Diabetes mellitus without complication   . Hypertension   . CCF (congestive cardiac failure) 06/04/2015  . Varicose veins     of leg  . Bowel habit changes   . Anemia   . Diabetes mellitus with renal manifestation 03/19/2009  . Vitamin D deficiency   . Hyperglyceridemia, pure   . Hyperlipidemia 02/01/2010  . Hyponatremia   . Mitral valve disorder 04/01/2009  . Atherosclerosis of coronary artery 08/04/2009  . CHF (congestive heart failure)     EF 30-35% on echo 2014  . Allergy 04/04/2009  . GERD (gastroesophageal reflux disease) 11/06/2009  . Hemorrhoids, external without complications 08/04/2009  . Arthritis   . Arthralgia   . Myalgia   . Palpitations   . Chronic nausea   . Malaise and fatigue   . Edema   . Atrial fibrillation 04/01/2009  . Dysuria     Patient Active Problem List   Diagnosis Date Noted  . Ache in joint 06/04/2015  . Arthritis 06/04/2015  . CCF (congestive cardiac  failure) 06/04/2015  . Feeling bilious 06/04/2015  . Arteriosclerosis of coronary artery 06/04/2015  . Difficult or painful urination 06/04/2015  . Dermatitis, eczematoid 06/04/2015  . Essential (primary) hypertension 06/04/2015  . Fatigue 06/04/2015  . Hyperglyceridemia, pure 06/04/2015  . Hypertriglyceridemia 06/04/2015  . Below normal amount of sodium in the blood 06/04/2015  . Muscle ache 06/04/2015  . Awareness of heartbeats 06/04/2015  . Asymptomatic varicose veins 06/04/2015  . Avitaminosis D 06/04/2015  . Diabetes mellitus, type 2 05/05/2015  . A-fib 05/04/2015  . Glaucoma 05/04/2015  . Acute non-ST segment elevation myocardial infarction 05/04/2015  . Edema leg 01/15/2015  . AF (paroxysmal atrial fibrillation) 01/15/2015  . Malaise and fatigue 05/25/2010  . HLD (hyperlipidemia) 02/01/2010  . Acid reflux 11/06/2009  . Absolute anemia 10/09/2009  . Atherosclerosis of coronary artery 08/04/2009  . Hemorrhoids without complication 08/04/2009  . Allergic rhinitis 04/04/2009  . Disorder of mitral valve 04/01/2009  . Diabetes 03/19/2009    Past Surgical History  Procedure Laterality Date  . Tonsillectomy    . Breast surgery    . Abdominal hysterectomy    . Appendectomy    . Hernia repair    . Cholecystectomy    . Stents  2011    Allergies Benzocaine; Contrast media; Other; Penicillin v potassium; Penicillins; and Sulfa antibiotics  Social History History  Substance Use Topics  . Smoking status: Never Smoker   . Smokeless tobacco: Not on file  .  Alcohol Use: No    Review of Systems Constitutional: Negative for fever. Eyes: Negative for visual changes. ENT: Negative for sore throat. Cardiovascular: Negative for chest pain. Respiratory: Negative for shortness of breath. Gastrointestinal: Negative for abdominal pain, positive for diarrhea Genitourinary: Negative for dysuria. Musculoskeletal: Positive for left elbow, left knee, left hip pain. Positive for  edema that is severe Skin: Negative for rash. Neurological: Positive for generalized weakness  10-point ROS otherwise negative.  ____________________________________________   PHYSICAL EXAM:  VITAL SIGNS: ED Triage Vitals  Enc Vitals Group     BP --      Pulse --      Resp --      Temp --      Temp src --      SpO2 --      Weight --      Height --      Head Cir --      Peak Flow --      Pain Score --      Pain Loc --      Pain Edu? --      Excl. in GC? --     Constitutional: Alert and oriented. Mild distress Eyes: Conjunctivae are normal. Normal extraocular movements. ENT   Head: Normocephalic and atraumatic.   Nose: No congestion/rhinnorhea.   Mouth/Throat: Mucous membranes are moist.   Neck: No stridor. Cardiovascular: Normal rate, regular rhythm. Normal and symmetric distal pulses are present in all extremities. No murmurs, rubs, or gallops. Respiratory: Normal respiratory effort without tachypnea nor retractions. Breath sounds are clear and equal bilaterally. No wheezes/rales/rhonchi. Gastrointestinal: Distended, nontender. No abdominal bruits. There is no CVA tenderness. Musculoskeletal: Tenderness noted over the left hip, knee, elbow. Marked edema past the knees bilaterally Neurologic:  Normal speech and language. No gross focal neurologic deficits are appreciated. Speech is normal. No gait instability. Skin:  Skin is warm, dry and intact. No rash noted. Pallor Psychiatric: Mood and affect are normal. Speech and behavior are normal. Patient exhibits appropriate insight and judgment.  ____________________________________________  ED COURSE:  Pertinent labs & imaging results that were available during my care of the patient were reviewed by me and considered in my medical decision making (see chart for details). We'll obtain x-rays, basic labs due to the degree of edema that she has and reevaluate. ____________________________________________     LABS (pertinent positives/negatives)  Labs Reviewed  C DIFFICILE QUICK SCAN W PCR REFLEX ([facility] ONLY) - Abnormal; Notable for the following:    C Diff antigen POSITIVE (*)    C Diff toxin POSITIVE (*)    All other components within normal limits  CBC WITH DIFFERENTIAL/PLATELET - Abnormal; Notable for the following:    WBC 22.5 (*)    RBC 2.91 (*)    Hemoglobin 8.1 (*)    HCT 25.0 (*)    RDW 14.9 (*)    Neutro Abs 19.5 (*)    Monocytes Absolute 1.6 (*)    All other components within normal limits  COMPREHENSIVE METABOLIC PANEL - Abnormal; Notable for the following:    Sodium 122 (*)    Chloride 86 (*)    Glucose, Bld 225 (*)    BUN 32 (*)    Creatinine, Ser 1.29 (*)    Calcium 8.3 (*)    Total Protein 5.3 (*)    Albumin 2.5 (*)    ALT 12 (*)    GFR calc non Af Amer 38 (*)    GFR calc Af Denyse Dago  44 (*)    All other components within normal limits  URINALYSIS COMPLETEWITH MICROSCOPIC ([facility] ONLY) - Abnormal; Notable for the following:    Color, Urine YELLOW (*)    APPearance HAZY (*)    Glucose, UA 150 (*)    Protein, ur 100 (*)    Bacteria, UA RARE (*)    Squamous Epithelial / LPF 0-5 (*)    All other components within normal limits  BRAIN NATRIURETIC PEPTIDE - Abnormal; Notable for the following:    B Natriuretic Peptide 901.0 (*)    All other components within normal limits  STOOL CULTURE    RADIOLOGY Images were viewed by me  Left knee, left hip, left elbow Are negative with the exception of a possible occult radial head fracture  ____________________________________________  FINAL ASSESSMENT AND PLAN  Fall, contusion, peripheral edema, leukocytosis, anemia, C. difficile colitis, hyponatremia  Plan: Patient with labs and imaging as dictated above. Patient was started on oral Flagyl, most her symptoms are probably secondary to C. difficile colitis. She will need inpatient admission, diuresis as well as physical therapy and possible rehabilitation placement.  Amylase aware of the plan and agrees.   Emily Filbert, MD   Emily Filbert, MD 06/21/15 1759  Emily Filbert, MD 06/21/15 1800

## 2015-06-21 NOTE — ED Notes (Signed)
Pt states that she was attempting to use the bathroom at home when she became weak and fell landing on her left side, pt states that she has had the runs for weeks, she has belching all day with dry heaves also, pt also is c/o left knee and elbow pain from the fall, pt has edema throughout her legs bilat and noted in herback as well, pt's abd appears distended as well, pt states that she also lost continence of bowel and urine when she missed the toilet

## 2015-06-21 NOTE — ED Notes (Signed)
Pt taken to x-ray via tech dawn

## 2015-06-22 ENCOUNTER — Inpatient Hospital Stay: Payer: Medicare Other

## 2015-06-22 LAB — BASIC METABOLIC PANEL
ANION GAP: 11 (ref 5–15)
BUN: 29 mg/dL — ABNORMAL HIGH (ref 6–20)
CALCIUM: 8.1 mg/dL — AB (ref 8.9–10.3)
CHLORIDE: 90 mmol/L — AB (ref 101–111)
CO2: 23 mmol/L (ref 22–32)
Creatinine, Ser: 1.16 mg/dL — ABNORMAL HIGH (ref 0.44–1.00)
GFR calc Af Amer: 50 mL/min — ABNORMAL LOW (ref >60)
GFR calc non Af Amer: 43 mL/min — ABNORMAL LOW (ref >60)
GLUCOSE: 122 mg/dL — AB (ref 65–99)
Potassium: 3.5 mmol/L (ref 3.5–5.1)
SODIUM: 124 mmol/L — AB (ref 135–145)

## 2015-06-22 LAB — GLUCOSE, CAPILLARY
GLUCOSE-CAPILLARY: 105 mg/dL — AB (ref 65–99)
GLUCOSE-CAPILLARY: 134 mg/dL — AB (ref 65–99)
Glucose-Capillary: 132 mg/dL — ABNORMAL HIGH (ref 65–99)
Glucose-Capillary: 143 mg/dL — ABNORMAL HIGH (ref 65–99)

## 2015-06-22 LAB — CBC
HEMATOCRIT: 23.4 % — AB (ref 35.0–47.0)
HEMOGLOBIN: 7.7 g/dL — AB (ref 12.0–16.0)
MCH: 28 pg (ref 26.0–34.0)
MCHC: 32.8 g/dL (ref 32.0–36.0)
MCV: 85.4 fL (ref 80.0–100.0)
Platelets: 397 10*3/uL (ref 150–440)
RBC: 2.74 MIL/uL — AB (ref 3.80–5.20)
RDW: 14.7 % — AB (ref 11.5–14.5)
WBC: 27.1 10*3/uL — AB (ref 3.6–11.0)

## 2015-06-22 MED ORDER — ALBUTEROL SULFATE (2.5 MG/3ML) 0.083% IN NEBU
2.5000 mg | INHALATION_SOLUTION | RESPIRATORY_TRACT | Status: DC | PRN
  Administered 2015-06-22 – 2015-07-06 (×5): 2.5 mg via RESPIRATORY_TRACT
  Filled 2015-06-22 (×5): qty 3

## 2015-06-22 MED ORDER — LINAGLIPTIN 5 MG PO TABS
5.0000 mg | ORAL_TABLET | Freq: Every day | ORAL | Status: DC
  Administered 2015-06-22 – 2015-06-25 (×4): 5 mg via ORAL
  Filled 2015-06-22 (×5): qty 1

## 2015-06-22 MED ORDER — DORZOLAMIDE HCL 2 % OP SOLN
1.0000 [drp] | Freq: Three times a day (TID) | OPHTHALMIC | Status: DC
  Administered 2015-06-22 – 2015-07-13 (×64): 1 [drp] via OPHTHALMIC
  Filled 2015-06-22 (×2): qty 10

## 2015-06-22 MED ORDER — ALPRAZOLAM 0.5 MG PO TABS
0.5000 mg | ORAL_TABLET | Freq: Once | ORAL | Status: AC
  Administered 2015-06-22: 0.5 mg via ORAL
  Filled 2015-06-22: qty 1

## 2015-06-22 MED ORDER — CARVEDILOL 25 MG PO TABS
25.0000 mg | ORAL_TABLET | Freq: Two times a day (BID) | ORAL | Status: DC
Start: 2015-06-22 — End: 2015-07-13
  Administered 2015-06-22 – 2015-07-13 (×43): 25 mg via ORAL
  Filled 2015-06-22 (×2): qty 1
  Filled 2015-06-22: qty 2
  Filled 2015-06-22 (×18): qty 1
  Filled 2015-06-22 (×2): qty 2
  Filled 2015-06-22 (×5): qty 1
  Filled 2015-06-22: qty 2
  Filled 2015-06-22 (×6): qty 1
  Filled 2015-06-22: qty 2
  Filled 2015-06-22 (×6): qty 1
  Filled 2015-06-22: qty 2

## 2015-06-22 MED ORDER — TIMOLOL MALEATE 0.25 % OP SOLN
1.0000 [drp] | Freq: Two times a day (BID) | OPHTHALMIC | Status: DC
  Administered 2015-06-22 – 2015-07-13 (×43): 1 [drp] via OPHTHALMIC
  Filled 2015-06-22 (×2): qty 5

## 2015-06-22 MED ORDER — DILTIAZEM HCL ER COATED BEADS 120 MG PO CP24
120.0000 mg | ORAL_CAPSULE | Freq: Every day | ORAL | Status: DC
  Administered 2015-06-22 – 2015-06-28 (×6): 120 mg via ORAL
  Filled 2015-06-22 (×6): qty 1

## 2015-06-22 MED ORDER — HYDROCORTISONE 1 % EX CREA
TOPICAL_CREAM | Freq: Four times a day (QID) | CUTANEOUS | Status: DC
  Administered 2015-06-22: 1 via TOPICAL
  Administered 2015-06-22 – 2015-06-26 (×18): via TOPICAL
  Administered 2015-06-27: 1 via TOPICAL
  Administered 2015-06-27 – 2015-07-01 (×15): via TOPICAL
  Administered 2015-07-01: 1 via TOPICAL
  Administered 2015-07-01 – 2015-07-13 (×42): via TOPICAL
  Filled 2015-06-22 (×6): qty 28

## 2015-06-22 MED ORDER — POTASSIUM CHLORIDE CRYS ER 10 MEQ PO TBCR
10.0000 meq | EXTENDED_RELEASE_TABLET | Freq: Every day | ORAL | Status: DC
  Administered 2015-06-22 – 2015-06-27 (×6): 10 meq via ORAL
  Filled 2015-06-22 (×8): qty 1

## 2015-06-22 MED ORDER — ALUM & MAG HYDROXIDE-SIMETH 200-200-20 MG/5ML PO SUSP
15.0000 mL | ORAL | Status: DC | PRN
  Administered 2015-06-22 – 2015-07-07 (×8): 15 mL via ORAL
  Filled 2015-06-22 (×9): qty 30

## 2015-06-22 MED ORDER — SERTRALINE HCL 50 MG PO TABS
50.0000 mg | ORAL_TABLET | Freq: Every day | ORAL | Status: DC
  Administered 2015-06-22 – 2015-07-13 (×22): 50 mg via ORAL
  Filled 2015-06-22 (×22): qty 1

## 2015-06-22 MED ORDER — HYDRALAZINE HCL 25 MG PO TABS
100.0000 mg | ORAL_TABLET | Freq: Three times a day (TID) | ORAL | Status: DC
  Administered 2015-06-22 – 2015-07-05 (×39): 100 mg via ORAL
  Filled 2015-06-22: qty 3
  Filled 2015-06-22 (×15): qty 4
  Filled 2015-06-22: qty 1
  Filled 2015-06-22 (×25): qty 4

## 2015-06-22 MED ORDER — ASPIRIN 81 MG PO TABS: 81.0000 mg | ORAL_TABLET | Freq: Every day | ORAL | Status: DC

## 2015-06-22 MED ORDER — ATORVASTATIN CALCIUM 20 MG PO TABS
80.0000 mg | ORAL_TABLET | Freq: Every evening | ORAL | Status: DC
  Administered 2015-06-22 – 2015-07-12 (×21): 80 mg via ORAL
  Filled 2015-06-22 (×21): qty 4

## 2015-06-22 MED ORDER — TRAMADOL HCL 50 MG PO TABS
50.0000 mg | ORAL_TABLET | Freq: Three times a day (TID) | ORAL | Status: DC | PRN
  Administered 2015-06-22 – 2015-06-26 (×5): 50 mg via ORAL
  Filled 2015-06-22 (×6): qty 1

## 2015-06-22 MED ORDER — PANTOPRAZOLE SODIUM 40 MG PO TBEC
40.0000 mg | DELAYED_RELEASE_TABLET | Freq: Two times a day (BID) | ORAL | Status: DC
  Administered 2015-06-22 – 2015-06-26 (×9): 40 mg via ORAL
  Filled 2015-06-22 (×10): qty 1

## 2015-06-22 NOTE — Progress Notes (Signed)
rn spoke to dr patel re: urinary retention and abdominal distention  and whether pt would benefit from flomax. And abdominal xray. md orders kub  only

## 2015-06-22 NOTE — Progress Notes (Signed)
Patient has not voided since this am, bladder scan shows , In and out cath q8h PRN per dr patel.

## 2015-06-22 NOTE — Consult Note (Signed)
Benign exam of elbow, no specific tenderness.  Unlikely to have fracture. Recommend activity as tolerated.

## 2015-06-22 NOTE — Progress Notes (Signed)
Dr Esaw Grandchild notified, patient is experiencing mild expiratory wheezing and requesting a breathing treatment. Fluids stopped and respiratory called for a breathing treatment. Continue to monitor.

## 2015-06-22 NOTE — Telephone Encounter (Signed)
Called  Katherine [name], Pt daughter in law reports that she is in the hospital right now. She has C. Diff. She also fell yesterday and this is why she went to the hospital and found out she had C. Diff. She will probably be going to a rehab after she gets out of the hospital for a little while. Just FYI.

## 2015-06-22 NOTE — Progress Notes (Signed)
[facility] - [place] at [facility]   PATIENT NAME: Katherine [name]    MR#:  [id number]  DATE OF BIRTH:  January 31, 1932  SUBJECTIVE:  Feels extremely weak and tired.  REVIEW OF SYSTEMS:   Review of Systems  Constitutional: Negative for fever, chills and weight loss.  HENT: Negative for ear discharge, ear pain and nosebleeds.   Eyes: Negative for blurred vision, pain and discharge.  Respiratory: Positive for shortness of breath. Negative for sputum production, wheezing and stridor.   Cardiovascular: Positive for leg swelling. Negative for chest pain, palpitations, orthopnea and PND.  Gastrointestinal: Positive for diarrhea. Negative for nausea, vomiting and abdominal pain.  Genitourinary: Negative for urgency and frequency.  Musculoskeletal: Negative for back pain and joint pain.  Neurological: Positive for weakness. Negative for sensory change, speech change and focal weakness.  Psychiatric/Behavioral: Negative for depression. The patient is not nervous/anxious.   All other systems reviewed and are negative.  Tolerating Diet:yes Tolerating PT: eval pending  DRUG ALLERGIES:   Allergies  Allergen Reactions  . Benzocaine Other (See Comments)    Unknown reaction  . Contrast Media [Iodinated Diagnostic Agents] Other (See Comments)    Tachycardia, SVT  . Other Other (See Comments)    MRI dye--reaction unknown.  Marland Kitchen Penicillin V Potassium Other (See Comments)  . Penicillins Other (See Comments)    Reaction: pt doesn't know, b/c it's been a long time ago.   . Sulfa Antibiotics Other (See Comments)    Reaction: pt doesn't know, b/c it's been a long time ago.     VITALS:  Blood pressure 142/62, pulse 80, temperature 99.2 F (37.3 C), temperature source Oral, resp. rate 18, height  (1.6 m), weight 96.616 kg (213 lb), SpO2 96 %.  PHYSICAL EXAMINATION:   Physical Exam  GENERAL:  81 y.o.-year-old patient lying in the bed with no acute distress.  obese EYES: Pupils equal, round, reactive to light and accommodation. No scleral icterus. Extraocular muscles intact.  HEENT: Head atraumatic, normocephalic. Oropharynx and nasopharynx clear.  NECK:  Supple, no jugular venous distention. No thyroid enlargement, no tenderness.  LUNGS: Normal breath sounds bilaterally, no wheezing, rales, rhonchi. No use of accessory muscles of respiration.  CARDIOVASCULAR: S1, S2 normal. No murmurs, rubs, or gallops.  ABDOMEN: Soft, nontender, nondistended. Bowel sounds present. No organomegaly or mass.  EXTREMITIES: No cyanosis, clubbing , chornic edema b/l.    NEUROLOGIC: Cranial nerves II through XII are intact. No focal Motor or sensory deficits b/l.   PSYCHIATRIC: The patient is alert and oriented x 3.  SKIN: No obvious rash, lesion, or ulcer.    LABORATORY PANEL:   CBC  Recent Labs Lab 06/22/15 0408  WBC 27.1*  HGB 7.7*  HCT 23.4*  PLT 397    Chemistries   Recent Labs Lab 06/21/15 1538 06/22/15 0408  NA 122* 124*  K 3.6 3.5  CL 86* 90*  CO2 24 23  GLUCOSE 225* 122*  BUN 32* 29*  CREATININE 1.29* 1.16*  CALCIUM 8.3* 8.1*  AST 16  --   ALT 12*  --   ALKPHOS 89  --   BILITOT 0.4  --     Cardiac Enzymes No results for input(s): TROPONINI in the last 168 hours.  RADIOLOGY:  Dg Elbow Complete Left  06/21/2015   CLINICAL DATA:  Patient fell at home with resulting left elbow pain  EXAM: LEFT ELBOW - COMPLETE 3+ VIEW  COMPARISON:  None.  FINDINGS: There is no dislocation. Fracture is  not definitively identified. There is mild elevation of the anterior fat pad however.  IMPRESSION: There may be a small elbow joint effusion which implies in the setting of trauma the possibility of a nondisplaced radiographically occult fracture. Correlate with degree of suspicion on clinical examination and consider further imaging with CT scan if indicated. Alternatively if symptoms persist centered radiographic followup in about 4-7 days.    Electronically Signed   By: Esperanza Heir M.D.   On: 06/21/2015 16:12   Dg Knee 2 Views Left  06/21/2015   CLINICAL DATA:  Fall, left knee pain  EXAM: LEFT KNEE - 1-2 VIEW  COMPARISON:  None.  FINDINGS: No acute fracture or dislocation. No lytic or sclerotic osseous lesion. No joint effusion. 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 Peripheral vascular atherosclerotic disease.  IMPRESSION: No acute osseous injury of the left knee.   Electronically Signed   By: Elige Ko   On: 06/21/2015 16:04   Dg Hip Unilat With Pelvis 2-3 Views Left  06/21/2015   CLINICAL DATA:  Fall  EXAM: DG HIP (WITH OR WITHOUT PELVIS) 2-3V LEFT  COMPARISON:  None.  FINDINGS: There is no evidence of hip fracture or dislocation. There is generalized osteopenia. There is no evidence of arthropathy or other focal bone abnormality. There is peripheral vascular atherosclerotic disease.  IMPRESSION: No acute osseous injury of the left hip.   Electronically Signed   By: Elige Ko   On: 06/21/2015 16:04     ASSESSMENT AND PLAN:  81 yr old W F with multiple medical problmes got out of rehab after having treated with IV abxs for Orbital cellulitis. Presented after a fall at home and ongoing diarrhea. Found to have   1. C. Diff: Collitis: Will start PO flagyl as long as she can tolerate PO. -probiotics  2. Acute Renal failure: Secondary to fluid loss from diarrhea. Hold diuretics and give IVF.  3. Hyponatremia: Secondary to fluid loss. IV NS. Recheck in am.  4. Leukocytosis: Sec to c.dif.  5.left elbow pain after fall with abnromal xray -get ortho to see pt if she rquires further imagine Resume PT after ortho sees pt CSW/CM for d/c planning  Case discussed with Care Management/Social Worker. Management plans discussed with the patient, family and they are in agreement.  CODE STATUS: full  DVT Prophylaxis: heparin  TOTAL TIME TAKING CARE OF THIS PATIENT: 30 minutes.  >50% time spent on  counselling and coordination of care  POSSIBLE D/C IN 2-3DAYS, DEPENDING ON CLINICAL CONDITION.   Katherine [name] M.D on 06/22/2015 at 3:20 PM  Between 7am to 6pm - Pager - [phone]  After 6pm go to [email or web address] - password [email or web address] [facility]  [facility]  Office  [phone]  CC: Primary care physician; Mila Merry, MD

## 2015-06-22 NOTE — Progress Notes (Signed)
MD paged for bladder scan results of >999 orders received to in and out cath times one

## 2015-06-22 NOTE — Progress Notes (Signed)
Prime doc paged for pt c/o of feeling anxious and not being able to sleep. Orders received

## 2015-06-22 NOTE — Progress Notes (Signed)
Patient complaining of gas and pain in abdomen, Dr Allena Katz notified, Maalox ordered. Ortho consult placed for left elbow fracture. Will continue to monitor.

## 2015-06-22 NOTE — Progress Notes (Signed)
Addendum to prev note at 0526. MD also notified of pt hgb of 7.7. No new orders received at that time in regards to hgb

## 2015-06-22 NOTE — Progress Notes (Signed)
PT Cancellation Note  Patient Details Name: Katherine [name] MRN: [id number] DOB: [birth year]   Cancelled Treatment:    Reason Eval/Treat Not Completed: Medical issues which prohibited therapy.  Per imaging (for L elbow): "There may be a small elbow joint effusion which implies in the setting of trauma the possibility of a nondisplaced radiographically occult fracture."  Discussed imaging with MD Allena Katz who recommended holding PT at this time (discussion of ortho consult for further work-up).  Will hold PT at this time and re-attempt PT eval at a later date/time as medically appropriate/as able.   Irving Burton Kalasia Crafton 06/22/2015, 11:13 AM Hendricks Limes, PT [phone]

## 2015-06-22 NOTE — Progress Notes (Signed)
In and out cath done with 1700 ml out, pt tolerated well

## 2015-06-23 LAB — CREATININE, SERUM
Creatinine, Ser: 0.97 mg/dL (ref 0.44–1.00)
GFR calc non Af Amer: 53 mL/min — ABNORMAL LOW (ref >60)

## 2015-06-23 LAB — GLUCOSE, CAPILLARY
Glucose-Capillary: 126 mg/dL — ABNORMAL HIGH (ref 65–99)
Glucose-Capillary: 212 mg/dL — ABNORMAL HIGH (ref 65–99)
Glucose-Capillary: 166 mg/dL — ABNORMAL HIGH (ref 65–99)
Glucose-Capillary: 190 mg/dL — ABNORMAL HIGH (ref 65–99)

## 2015-06-23 LAB — STOOL CULTURE: SPECIAL REQUESTS: NORMAL

## 2015-06-23 LAB — CBC
HCT: 27 % — ABNORMAL LOW (ref 35.0–47.0)
Hemoglobin: 8.9 g/dL — ABNORMAL LOW (ref 12.0–16.0)
MCH: 28.2 pg (ref 26.0–34.0)
MCHC: 33.1 g/dL (ref 32.0–36.0)
MCV: 85 fL (ref 80.0–100.0)
Platelets: 452 10*3/uL — ABNORMAL HIGH (ref 150–440)
RBC: 3.18 MIL/uL — AB (ref 3.80–5.20)
RDW: 14.6 % — AB (ref 11.5–14.5)
WBC: 33.9 10*3/uL — ABNORMAL HIGH (ref 3.6–11.0)

## 2015-06-23 LAB — SODIUM: Sodium: 122 mmol/L — ABNORMAL LOW (ref 135–145)

## 2015-06-23 MED ORDER — RISAQUAD PO CAPS
1.0000 | ORAL_CAPSULE | Freq: Two times a day (BID) | ORAL | Status: DC
  Administered 2015-06-23 – 2015-07-13 (×39): 1 via ORAL
  Filled 2015-06-23 (×45): qty 1

## 2015-06-23 MED ORDER — GLUCERNA SHAKE PO LIQD
237.0000 mL | Freq: Three times a day (TID) | ORAL | Status: DC
  Administered 2015-06-23 – 2015-06-26 (×7): 237 mL via ORAL

## 2015-06-23 MED ORDER — ONDANSETRON HCL 4 MG/2ML IJ SOLN
4.0000 mg | INTRAMUSCULAR | Status: DC | PRN
  Administered 2015-06-23 – 2015-06-26 (×4): 4 mg via INTRAVENOUS
  Filled 2015-06-23 (×4): qty 2

## 2015-06-23 MED ORDER — METRONIDAZOLE 500 MG PO TABS
500.0000 mg | ORAL_TABLET | Freq: Four times a day (QID) | ORAL | Status: DC
  Administered 2015-06-23 – 2015-06-24 (×5): 500 mg via ORAL
  Filled 2015-06-23 (×5): qty 1

## 2015-06-23 MED ORDER — GLUCERNA PO LIQD: 237.0000 mL | Freq: Three times a day (TID) | ORAL | Status: DC

## 2015-06-23 NOTE — Progress Notes (Addendum)
Clinical Social Worker (CSW) received call from patient's daughter in law [place] [phone]. Per Joseph Art she would like for patient to go back to [facility]. CSW explained that patient is positive for C-Diff and will require a private room. CSW presented bed offers to [place]. She chose [facility]. Per Renee patient had a co-pay on day 1 at [facility] and Renee's understanding was that once patient met her $3,200 deductible she would have no more co-pays. CSW encouraged Renee to speak with the business office at [facility]. Plan is for patient to D/C to [facility] when stable. Patient aware of above. CSW will continue to follow and assist as needed.   Blima Rich, [place] [phone]

## 2015-06-23 NOTE — Clinical Social Work Note (Signed)
Clinical Social Work Assessment  Patient Details  Name: Katherine [name] MRN: [id number] Date of Birth: 1932-05-27  Date of referral:  06/23/15               Reason for consult:  Facility Placement                Permission sought to share information with:  Facility Sport and exercise psychologist, Family Supports Permission granted to share information::  Yes, Verbal Permission Granted  Name::      [name]::   [place]  Relationship::     Contact Information:     Housing/Transportation Living arrangements for the past 2 months:  [place], [place] of Information:  Patient, Adult Children Patient Interpreter Needed:  None Criminal Activity/Legal Involvement Pertinent to Current Situation/Hospitalization:  No - Comment as needed Significant Relationships:  Adult Children, Spouse Lives with:  Spouse Do you feel safe going back to the place where you live?  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    Social Worker assessment / plan: Holiday representative (CSW) met with patient to discuss D/C plan. CSW introduced self and explained role of CSW department. Patient was laying in the bed and appeared to be in pain and very weak. Patient reported that she needed assistance eating lunch. CSW asked unit clerk to send a nurse tech in room to provide assistance. Patient reported that she lives in [place] with her husband Katherine [name]. Per patient her husband is handicapped and she provides care for him. While patient is in the hospital their son Katherine [name] is caring for [place]. Patient reported that she was at [facility] for rehab and discharged home last week. Per Tiffany admissions coordinator at [facility] patient was there from 05/14/15 to 06/15/15 using 32 days. Per Tiffany patient was in her co-pay days and will continue  to be in co-pay days if she returns to rehab. CSW made patient aware that if she goes to rehab she will have a co-pay starting day 1 around $160 per day. Patient verbalized her understanding and reported that she can pay the co-pays. Patient is agreeable to SNF search in [facility] and prefers to [facility].   CSW contacted patient's son Katherine [name] to discuss D/C plan. CSW made [facility] aware of co-pays and that PT is recommending SNF today. CSW gave son Towanda Octave phone number to start Medicaid application. CSW explained that patient has used 32 days and will have around 68 days left until she has to private pay for rehab. Son verbalized his understanding.   FL2 complete and faxed out.    Employment status:  Retired Nurse, adult PT Recommendations:  [place] / Referral to community resources:  [place]  Patient/Family's Response to care:  Patient is agreeable to [facility].   Patient/Family's Understanding of and Emotional Response to Diagnosis, Current Treatment, and Prognosis:  Patient thanked CSW for visit and assisting placement.   Emotional Assessment Appearance:  Appears stated age Attitude/Demeanor/Rapport:    Affect (typically observed):  Pleasant, Quiet Orientation:  Oriented to Self, Oriented to Place, Oriented to  Time, Oriented to Situation Alcohol / Substance use:  Not Applicable Psych involvement (Current and /or in the community):  No (Comment)  Discharge Needs  Concerns to be addressed:  Discharge Planning Concerns Readmission  within the last 30 days:  No Current discharge risk:  Chronically ill Barriers to Discharge:  Continued Medical Work up   Elwyn Reach 06/23/2015, 11:55 AM

## 2015-06-23 NOTE — Evaluation (Signed)
Physical Therapy Evaluation Patient Details Name: Katherine [name] MRN: [id number] DOB: [birth year] Today's Date: 06/23/2015   History of Present Illness  Pt is an 81 y.o. female admitted to the hospital with C-diff and acute renal failure.  Pt with recent hospital stay at [facility] for eye infection and heart problems and discharged to [facility].  Pt reports fall at [facility] and also fall after discharging to home (L knee and hip imaging negative for fracture; L elbow x-ray report concerning for fracture but per ortho consult:  benign exam of elbow, no specific tenderness, unlikely to have fracture, recommend activity as tolerated).  Clinical Impression  Currently pt demonstrates impairments with strength, balance, activity tolerance and limitations with functional mobility.  Prior to recent hospital admissions, pt was independent without AD (most recently using RW at home).  Pt lives with her 35 y.o. Husband (who pt reports is unable to assist pt d/t requiring assist himself) and can stay on main level (3 STE home with railing).  Currently pt requires assist with bed mobility and declined to stand d/t fatigue and not feeling well (pt layed back down in bed instead).  Pt also with recent falls.  Pt would benefit from skilled PT to address above noted impairments and functional limitations.  Recommend pt discharge to STR when medically appropriate.     Follow Up Recommendations SNF    Equipment Recommendations  3in1 (PT)    Recommendations for Other Services       Precautions / Restrictions Precautions Precautions: Fall Restrictions Weight Bearing Restrictions: No      Mobility  Bed Mobility Overal bed mobility: Needs Assistance;+2 for physical assistance Bed Mobility: Supine to Sit;Sit to Supine     Supine to sit: Mod assist;Max assist Sit to supine: Mod assist;+2 for physical assistance (2 assist to scoot pt up in bed)      Transfers                 General transfer comment:  Pt c/o being "too sick" to stand and get into the chair and layed back down on the bed  Ambulation/Gait             General Gait Details: Deferred (see transfers above)  Stairs            Wheelchair Mobility    Modified Rankin (Stroke Patients Only)       Balance Overall balance assessment: Needs assistance Sitting-balance support: Bilateral upper extremity supported Sitting balance-Leahy Scale: Fair                                       Pertinent Vitals/Pain Pain Assessment: No/denies pain  Vitals stable and WFL throughout treatment session.    Home Living Family/patient expects to be discharged to:: Skilled nursing facility Living Arrangements: Spouse/significant other Available Help at Discharge:  (Pt reports her 85 y.o. husband can not help her d/t he needs help himself) Type of Home: House Home Access: Stairs to enter Entrance Stairs-Rails: Left Entrance Stairs-Number of Steps: 3 Home Layout: Able to live on main level with bedroom/bathroom Home Equipment: Walker - 2 wheels;Toilet riser      Prior Function Level of Independence: Independent with assistive device(s)         Comments: Pt using RW upon discharge from STR (was not using an AD prior to recent hospital admissions); takes sponge baths     Hand  Dominance        Extremity/Trunk Assessment   Upper Extremity Assessment: Generalized weakness           Lower Extremity Assessment: Generalized weakness         Communication   Communication: No difficulties  Cognition Arousal/Alertness: Awake/alert Behavior During Therapy: Anxious Overall Cognitive Status: Within Functional Limits for tasks assessed                      General Comments   Nursing cleared pt for participation in physical therapy.  Pt agreeable to PT session.    Exercises   Treatment:  Performed semi-supine B LE therapeutic exercise x 10 reps:  Ankle pumps (AROM B LE's); quad sets x3  second holds (AROM B LE's); glute squeezes x3 second holds (AROM B); SAQ's (AROM R; AROM L); heelslides (AAROM R; AAROM L), hip abd/adduction (AAROM R; AAROM L).  Pt required vc's and tactile cues for correct technique with exercises.       Assessment/Plan    PT Assessment Patient needs continued PT services  PT Diagnosis Generalized weakness   PT Problem List Decreased strength;Decreased activity tolerance;Decreased balance;Decreased mobility  PT Treatment Interventions DME instruction;Gait training;Stair training;Functional mobility training;Therapeutic activities;Therapeutic exercise;Balance training;Patient/family education   PT Goals (Current goals can be found in the Care Plan section) Acute Rehab PT Goals Patient Stated Goal: To feel better PT Goal Formulation: With patient Time For Goal Achievement: 07/07/15 Potential to Achieve Goals: Good    Frequency Min 2X/week   Barriers to discharge Decreased caregiver support      Co-evaluation               End of Session   Activity Tolerance: Patient limited by fatigue (and not feeling well) Patient left: in bed;with call bell/phone within reach;with bed alarm set (B heels elevated via pillow) Nurse Communication: Mobility status         Time: 1610-9604 PT Time Calculation (min) (ACUTE ONLY): 30 min   Charges:   PT Evaluation $Initial PT Evaluation Tier I: 1 Procedure PT Treatments $Therapeutic Exercise: 8-22 mins   PT G CodesHendricks Limes 07-02-2015, 9:45 AM Hendricks Limes, PT [phone]

## 2015-06-23 NOTE — Progress Notes (Signed)
[facility] - [place] at [facility]   PATIENT NAME: Katherine [name]    MR#:  [id number]  DATE OF BIRTH:  [birth year]  SUBJECTIVE:  Feels extremely weak and tired.nausea. Drinking juice and glucerna  REVIEW OF SYSTEMS:   Review of Systems  Constitutional: Negative for fever, chills and weight loss.  HENT: Negative for ear discharge, ear pain and nosebleeds.   Eyes: Negative for blurred vision, pain and discharge.  Respiratory: Negative for sputum production, shortness of breath, wheezing and stridor.   Cardiovascular: Positive for leg swelling. Negative for chest pain, palpitations, orthopnea and PND.  Gastrointestinal: Positive for diarrhea. Negative for nausea, vomiting and abdominal pain.  Genitourinary: Negative for urgency and frequency.  Musculoskeletal: Negative for back pain and joint pain.  Neurological: Positive for weakness. Negative for sensory change, speech change and focal weakness.  Psychiatric/Behavioral: Negative for depression. The patient is not nervous/anxious.   All other systems reviewed and are negative.  Tolerating Diet:yes Tolerating PT: recommends SNF  DRUG ALLERGIES:   Allergies  Allergen Reactions  . Benzocaine Other (See Comments)    Unknown reaction  . Contrast Media [Iodinated Diagnostic Agents] Other (See Comments)    Tachycardia, SVT  . Other Other (See Comments)    MRI dye--reaction unknown.  Marland Kitchen Penicillin V Potassium Other (See Comments)  . Penicillins Other (See Comments)    Reaction: pt doesn't know, b/c it's been a long time ago.   . Sulfa Antibiotics Other (See Comments)    Reaction: pt doesn't know, b/c it's been a long time ago.     VITALS:  Blood pressure 160/79, pulse 74, temperature 97.5 F (36.4 C), temperature source Oral, resp. rate 22, height  (1.6 m), weight 96.616 kg (213 lb), SpO2 94 %.  PHYSICAL EXAMINATION:   Physical Exam  GENERAL:  81 y.o.-year-old patient lying in the bed with no  acute distress. obese EYES: Pupils equal, round, reactive to light and accommodation. No scleral icterus. Extraocular muscles intact.  HEENT: Head atraumatic, normocephalic. Oropharynx and nasopharynx clear.  NECK:  Supple, no jugular venous distention. No thyroid enlargement, no tenderness.  LUNGS: Normal breath sounds bilaterally, no wheezing, rales, rhonchi. No use of accessory muscles of respiration.  CARDIOVASCULAR: S1, S2 normal. No murmurs, rubs, or gallops.  ABDOMEN: Soft, nontender, nondistended. Bowel sounds present. No organomegaly or mass.  EXTREMITIES: No cyanosis, clubbing , chornic edema b/l.    NEUROLOGIC: Cranial nerves II through XII are intact. No focal Motor or sensory deficits b/l.  gen weakness PSYCHIATRIC:  patient is alert and oriented x 3.  SKIN: No obvious rash, lesion, or ulcer.   LABORATORY PANEL:   CBC  Recent Labs Lab 06/23/15 0528  WBC 33.9*  HGB 8.9*  HCT 27.0*  PLT 452*    Chemistries   Recent Labs Lab 06/21/15 1538 06/22/15 0408 06/23/15 0528  NA 122* 124*  --   K 3.6 3.5  --   CL 86* 90*  --   CO2 24 23  --   GLUCOSE 225* 122*  --   BUN 32* 29*  --   CREATININE 1.29* 1.16* 0.97  CALCIUM 8.3* 8.1*  --   AST 16  --   --   ALT 12*  --   --   ALKPHOS 89  --   --   BILITOT 0.4  --   --     Cardiac Enzymes No results for input(s): TROPONINI in the last 168 hours.  RADIOLOGY:  Dg Elbow  Complete Left  06/21/2015   CLINICAL DATA:  Patient fell at home with resulting left elbow pain  EXAM: LEFT ELBOW - COMPLETE 3+ VIEW  COMPARISON:  None.  FINDINGS: There is no dislocation. Fracture is not definitively identified. There is mild elevation of the anterior fat pad however.  IMPRESSION: There may be a small elbow joint effusion which implies in the setting of trauma the possibility of a nondisplaced radiographically occult fracture. Correlate with degree of suspicion on clinical examination and consider further imaging with CT scan if indicated.  Alternatively if symptoms persist centered radiographic followup in about 4-7 days.   Electronically Signed   By: Esperanza Heir M.D.   On: 06/21/2015 16:12   Dg Knee 2 Views Left  06/21/2015   CLINICAL DATA:  Fall, left knee pain  EXAM: LEFT KNEE - 1-2 VIEW  COMPARISON:  None.  FINDINGS: No acute fracture or dislocation. No lytic or sclerotic osseous lesion. No joint effusion. 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 Peripheral vascular atherosclerotic disease.  IMPRESSION: No acute osseous injury of the left knee.   Electronically Signed   By: Elige Ko   On: 06/21/2015 16:04   Dg Abd 1 View  06/22/2015   CLINICAL DATA:  Abdominal pain and distention.  EXAM: ABDOMEN - 1 VIEW  COMPARISON:  09/18/2009  FINDINGS: The abdominal gas pattern is negative for obstruction or perforation. There is a generous volume of stool throughout the colon. There are surgical clips in the right upper quadrant consistent with cholecystectomy. No biliary or urinary calculi are evident.  IMPRESSION: Negative for obstruction or perforation.   Electronically Signed   By: Ellery Plunk M.D.   On: 06/22/2015 17:09   Dg Hip Unilat With Pelvis 2-3 Views Left  06/21/2015   CLINICAL DATA:  Fall  EXAM: DG HIP (WITH OR WITHOUT PELVIS) 2-3V LEFT  COMPARISON:  None.  FINDINGS: There is no evidence of hip fracture or dislocation. There is generalized osteopenia. There is no evidence of arthropathy or other focal bone abnormality. There is peripheral vascular atherosclerotic disease.  IMPRESSION: No acute osseous injury of the left hip.   Electronically Signed   By: Elige Ko   On: 06/21/2015 16:04     ASSESSMENT AND PLAN:  81 yr old W F with multiple medical problmes got out of rehab after having treated with IV abxs for Orbital cellulitis. Presented after a fall at home and ongoing diarrhea. Found to have  1.Acute C. Difficile Collitis:  - PO flagyl as long as she can tolerate PO. -probiotics -diarrhea  resolved  2. Acute Renal failure: Secondary to fluid loss from diarrhea. Hold diuretics and give IVF.  3. Hyponatremia: Secondary to fluid loss. IV NS.  -s Na pending  4. Leukocytosis: Sec to c.dif. -wbc 27K--->33K  5.left elbow pain after fall with abnromal xray - ortho eval  Dr Rosita Kea noted Resume PT after ortho sees pt CSW/CM for d/c planning  Case discussed with Care Management/Social Worker. Management plans discussed with the patient, family and they are in agreement.  CODE STATUS: full  DVT Prophylaxis: heparin  TOTAL TIME TAKING CARE OF THIS PATIENT: 30 minutes.  >50% time spent on counselling and coordination of care  POSSIBLE D/C IN 2-3DAYS, DEPENDING ON CLINICAL CONDITION.   Jack Mineau M.D on 06/23/2015 at 1:00 PM  Between 7am to 6pm - Pager - [phone]  After 6pm go to [email or web address] - Scientist, research (life sciences) Hospitalists  Office  [phone]  CC: Primary care physician; Mila Merry, MD

## 2015-06-23 NOTE — Clinical Social Work Placement (Signed)
   CLINICAL SOCIAL WORK PLACEMENT  NOTE  Date:  06/23/2015  Patient Details  Name: ENVI EAGLESON MRN: [id number] Date of Birth: 01-Aug-1932  Clinical Social Work is seeking post-discharge placement for this patient at the Skilled  Nursing Facility level of care (*CSW will initial, date and re-position this form in  chart as items are completed):  Yes   Patient/family provided with [place] Clinical Social Work Department's list of facilities offering this level of care within the geographic area requested by the patient (or if unable, by the patient's family).  Yes   Patient/family informed of their freedom to choose among providers that offer the needed level of care, that participate in Medicare, Medicaid or managed care program needed by the patient, have an available bed and are willing to accept the patient.  Yes   Patient/family informed of [facility]'s ownership interest in [facility] and [facility], as well as of the fact that they are under no obligation to receive care at these facilities.  PASRR submitted to EDS on       PASRR number received on       Existing PASRR number confirmed on 06/23/15     FL2 transmitted to all facilities in geographic area requested by pt/family on 06/23/15     FL2 transmitted to all facilities within larger geographic area on       Patient informed that his/her managed care company has contracts with or will negotiate with certain facilities, including the following:            Patient/family informed of bed offers received.  Patient chooses bed at       Physician recommends and patient chooses bed at      Patient to be transferred to   on  .  Patient to be transferred to facility by       Patient family notified on   of transfer.  Name of family member notified:        PHYSICIAN Please sign FL2     Additional Comment:    _______________________________________________ Haig Prophet, LCSW 06/23/2015, 11:54  AM

## 2015-06-24 ENCOUNTER — Inpatient Hospital Stay: Payer: Medicare Other

## 2015-06-24 ENCOUNTER — Other Ambulatory Visit: Payer: Self-pay | Admitting: Family Medicine

## 2015-06-24 LAB — GLUCOSE, CAPILLARY
GLUCOSE-CAPILLARY: 263 mg/dL — AB (ref 65–99)
Glucose-Capillary: 177 mg/dL — ABNORMAL HIGH (ref 65–99)
Glucose-Capillary: 260 mg/dL — ABNORMAL HIGH (ref 65–99)
Glucose-Capillary: 248 mg/dL — ABNORMAL HIGH (ref 65–99)

## 2015-06-24 MED ORDER — IMMUNE GLOBULIN HUMAN NICU IV SYRINGE 100 MG/ML: 400.0000 mg/kg | Freq: Once | INTRAMUSCULAR | Status: DC

## 2015-06-24 MED ORDER — METRONIDAZOLE IN NACL 5-0.79 MG/ML-% IV SOLN
500.0000 mg | Freq: Three times a day (TID) | INTRAVENOUS | Status: DC
  Administered 2015-06-24 – 2015-06-28 (×12): 500 mg via INTRAVENOUS
  Filled 2015-06-24 (×16): qty 100

## 2015-06-24 MED ORDER — SODIUM CHLORIDE 0.9 % IV SOLN
500.0000 mg | Freq: Three times a day (TID) | INTRAVENOUS | Status: AC
  Administered 2015-06-24 – 2015-06-28 (×13): 500 mg via INTRAVENOUS
  Filled 2015-06-24 (×14): qty 500

## 2015-06-24 MED ORDER — IMMUNE GLOBULIN (HUMAN) 20 GM/200ML IV SOLN
400.0000 mg/kg | Freq: Once | INTRAVENOUS | Status: AC
  Administered 2015-06-24: 40 g via INTRAVENOUS
  Filled 2015-06-24 (×3): qty 400

## 2015-06-24 MED ORDER — VANCOMYCIN 50 MG/ML ORAL SOLUTION
250.0000 mg | Freq: Four times a day (QID) | ORAL | Status: DC
  Administered 2015-06-24 – 2015-07-13 (×75): 250 mg via ORAL
  Filled 2015-06-24 (×100): qty 5

## 2015-06-24 NOTE — Progress Notes (Signed)
[facility] - [place] at [facility]   PATIENT NAME: Katherine [name]    MR#:  [id number]  DATE OF BIRTH:  Jun 13, 1932  SUBJECTIVE:  Feels extremely weak and tired.  REVIEW OF SYSTEMS:   Review of Systems  Constitutional: Negative for fever, chills and weight loss.  HENT: Negative for ear discharge, ear pain and nosebleeds.   Eyes: Negative for blurred vision, pain and discharge.  Respiratory: Positive for shortness of breath. Negative for sputum production, wheezing and stridor.   Cardiovascular: Positive for leg swelling. Negative for chest pain, palpitations, orthopnea and PND.  Gastrointestinal: Positive for nausea, abdominal pain and diarrhea. Negative for vomiting.  Genitourinary: Negative for urgency and frequency.  Musculoskeletal: Negative for back pain and joint pain.  Neurological: Positive for weakness. Negative for sensory change, speech change and focal weakness.  Psychiatric/Behavioral: Negative for depression. The patient is not nervous/anxious.   All other systems reviewed and are negative.  Tolerating Diet: not able to eat much, very nauseous and distended abdomen Tolerating PT: eval pending  DRUG ALLERGIES:   Allergies  Allergen Reactions  . Benzocaine Other (See Comments)    Unknown reaction  . Contrast Media [Iodinated Diagnostic Agents] Other (See Comments)    Tachycardia, SVT  . Other Other (See Comments)    MRI dye--reaction unknown.  Marland Kitchen Penicillin V Potassium Other (See Comments)  . Penicillins Other (See Comments)    Reaction: pt doesn't know, b/c it's been a long time ago.   . Sulfa Antibiotics Other (See Comments)    Reaction: pt doesn't know, b/c it's been a long time ago.     VITALS:  Blood pressure 140/78, pulse 67, temperature 97.9 F (36.6 C), temperature source Oral, resp. rate 18, height 5\' 3"  (1.6 m), weight 96.616 kg (213 lb), SpO2 97 %. PHYSICAL EXAMINATION:  Physical Exam  Constitutional: She is oriented to  person, place, and time and well-developed, well-nourished, and in no distress.  HENT:  Head: Normocephalic and atraumatic.  Eyes: Conjunctivae and EOM are normal. Pupils are equal, round, and reactive to light.  Neck: Normal range of motion. Neck supple. No tracheal deviation present. No thyromegaly present.  Cardiovascular: Normal rate, regular rhythm and normal heart sounds.   Pulmonary/Chest: Effort normal and breath sounds normal. No respiratory distress. She has no wheezes. She exhibits no tenderness.  Abdominal: Soft. Bowel sounds are normal. She exhibits distension (gaseous). There is tenderness.  Musculoskeletal: Normal range of motion.  Neurological: She is alert and oriented to person, place, and time. No cranial nerve deficit.  Skin: Skin is warm and dry. No rash noted.  Psychiatric: Mood and affect normal.   LABORATORY PANEL:   CBC  Recent Labs Lab 06/23/15 0528  WBC 33.9*  HGB 8.9*  HCT 27.0*  PLT 452*    Chemistries   Recent Labs Lab 06/21/15 1538 06/22/15 0408 06/23/15 0528  NA 122* 124* 122*  K 3.6 3.5  --   CL 86* 90*  --   CO2 24 23  --   GLUCOSE 225* 122*  --   BUN 32* 29*  --   CREATININE 1.29* 1.16* 0.97  CALCIUM 8.3* 8.1*  --   AST 16  --   --   ALT 12*  --   --   ALKPHOS 89  --   --   BILITOT 0.4  --   --     Cardiac Enzymes No results for input(s): TROPONINI in the last 168 hours.  RADIOLOGY:  Dg Abd 1 View  06/22/2015   CLINICAL DATA:  Abdominal pain and distention.  EXAM: ABDOMEN - 1 VIEW  COMPARISON:  09/18/2009  FINDINGS: The abdominal gas pattern is negative for obstruction or perforation. There is a generous volume of stool throughout the colon. There are surgical clips in the right upper quadrant consistent with cholecystectomy. No biliary or urinary calculi are evident.  IMPRESSION: Negative for obstruction or perforation.   Electronically Signed   By: Ellery Plunk M.D.   On: 06/22/2015 17:09   ASSESSMENT AND PLAN:  81 yr  old W F with multiple medical problmes got out of rehab after having treated with IV abxs for Orbital cellulitis. Presented after a fall at home and ongoing diarrhea.   1. C. Diff: Collitis: she looks worse and toxic appearing. Will get abd x-ray and c/s GI. - change to IV flagyl, PO vanco and add imipenem per Dr Markham Jordan, also start IVIG per him. - her leukocytosis is worsening and so is her clinical condition. Could be spilling bacteria in abd cavity so abx recommended per GI.  2. Acute Renal failure: Secondary to fluid loss from diarrhea. Hold diuretics and give IVF.  3. Hyponatremia: Secondary to fluid loss. IV NS. monitor  4. Leukocytosis: Sec to c.dif. Worsening.  5.left elbow pain after fall with abnromal xray -appreciate ortho input - no pathology Resume PT  CSW/CM for d/c planning  Case discussed with Care Management/Social Worker. Management plans discussed with the patient,  and Dr Markham Jordan  CODE STATUS: full  DVT Prophylaxis: heparin  TOTAL TIME (Critical care) TAKING CARE OF THIS PATIENT: 30 minutes.   She looks clinically worse and high risk for cardio-resp failure, multi-organ failure and death.  >50% time spent on counselling and coordination of care - talking with patient and Dr Markham Jordan  POSSIBLE D/C IN 2-3 DAYS, DEPENDING ON CLINICAL CONDITION.   [facility], Elyanna Wallick M.D on 06/24/2015 at 4:42 PM  Between 7am to 6pm - Pager - [phone]  After 6pm go to [email or web address] - password [email or web address]  [place] Hospital[facility]  Office  [phone]  CC: Primary care physician; Mila Merry, MD

## 2015-06-24 NOTE — Progress Notes (Addendum)
   06/24/15 2200  Clinical Encounter Type  Visited With Patient  Visit Type Spiritual support  Spiritual Encounters  Spiritual Needs Grief support  Stress Factors  Patient Stress Factors Health changes   Faith: Christian Status:contact precautions, alert and oriented but minimal independence Family: none present, she said daughter n law came but did not say anything Visit Assessment: The patient shared that she could not reach her remote, the food tray, her pillows, or the panic or alert button, and that everything had been moved after Xray; Chaplain assisted her as best as possible before contacting the nurses' station  Pastoral care can be reached at [phone] or by submitting an online request

## 2015-06-24 NOTE — Progress Notes (Signed)
Checked to see if pre-authorization would be needed for non-emergent EMS transport.  Per [employer]’s automated system, [phone], patient has a [employer] Medicare Advantage PPO policy.  [employer] Medicare PPO plans do not require pre-auth for non-emergent ground transports using service codes A0426 or A0428.   °

## 2015-06-24 NOTE — Progress Notes (Signed)
Clinical Child psychotherapist (CSW) received call from patient's daughter in law Renee inquiring about D/C. CSW made Renee aware that MD has not rounded yet and CSW will update her on D/C date. CSW made Renee aware that D/C could be today if patient is medically stable. CSW also discussed long term care medicaid application with Renee. Per Luster Landsberg she is going to call [facility] Millet or go to DSS to start Medicaid application for patient. CSW will continue to follow and assist as needed.   Jetta Lout, LCSWA [phone]

## 2015-06-24 NOTE — Care Management Important Message (Signed)
Important Message  Patient Details  Name: SHERRELLE PROCHAZKA MRN: [id number] Date of Birth: 06-18-1932   Medicare Important Message Given:  Yes-second notification given    Collie Siad, RN 06/24/2015, 1:12 PM

## 2015-06-25 ENCOUNTER — Inpatient Hospital Stay: Payer: Medicare Other

## 2015-06-25 DIAGNOSIS — A047 Enterocolitis due to Clostridium difficile: Principal | ICD-10-CM

## 2015-06-25 DIAGNOSIS — W19XXXS Unspecified fall, sequela: Secondary | ICD-10-CM

## 2015-06-25 DIAGNOSIS — J9600 Acute respiratory failure, unspecified whether with hypoxia or hypercapnia: Secondary | ICD-10-CM | POA: Clinically undetermined

## 2015-06-25 DIAGNOSIS — K567 Ileus, unspecified: Secondary | ICD-10-CM

## 2015-06-25 DIAGNOSIS — A419 Sepsis, unspecified organism: Secondary | ICD-10-CM

## 2015-06-25 DIAGNOSIS — I482 Chronic atrial fibrillation: Secondary | ICD-10-CM

## 2015-06-25 DIAGNOSIS — R739 Hyperglycemia, unspecified: Secondary | ICD-10-CM

## 2015-06-25 DIAGNOSIS — J9811 Atelectasis: Secondary | ICD-10-CM

## 2015-06-25 DIAGNOSIS — E871 Hypo-osmolality and hyponatremia: Secondary | ICD-10-CM

## 2015-06-25 DIAGNOSIS — R140 Abdominal distension (gaseous): Secondary | ICD-10-CM

## 2015-06-25 DIAGNOSIS — J9601 Acute respiratory failure with hypoxia: Secondary | ICD-10-CM

## 2015-06-25 DIAGNOSIS — R609 Edema, unspecified: Secondary | ICD-10-CM

## 2015-06-25 DIAGNOSIS — D649 Anemia, unspecified: Secondary | ICD-10-CM

## 2015-06-25 DIAGNOSIS — R9431 Abnormal electrocardiogram [ECG] [EKG]: Secondary | ICD-10-CM

## 2015-06-25 DIAGNOSIS — J90 Pleural effusion, not elsewhere classified: Secondary | ICD-10-CM

## 2015-06-25 DIAGNOSIS — W19XXXA Unspecified fall, initial encounter: Secondary | ICD-10-CM | POA: Insufficient documentation

## 2015-06-25 LAB — BASIC METABOLIC PANEL
Anion gap: 7 (ref 5–15)
BUN: 30 mg/dL — ABNORMAL HIGH (ref 6–20)
CO2: 24 mmol/L (ref 22–32)
CREATININE: 1 mg/dL (ref 0.44–1.00)
Calcium: 8 mg/dL — ABNORMAL LOW (ref 8.9–10.3)
Chloride: 90 mmol/L — ABNORMAL LOW (ref 101–111)
GFR calc non Af Amer: 51 mL/min — ABNORMAL LOW (ref >60)
GFR, EST AFRICAN AMERICAN: 60 mL/min — AB (ref >60)
Glucose, Bld: 175 mg/dL — ABNORMAL HIGH (ref 65–99)
Potassium: 4.4 mmol/L (ref 3.5–5.1)
Sodium: 121 mmol/L — ABNORMAL LOW (ref 135–145)

## 2015-06-25 LAB — GLUCOSE, CAPILLARY
GLUCOSE-CAPILLARY: 165 mg/dL — AB (ref 65–99)
GLUCOSE-CAPILLARY: 104 mg/dL — AB (ref 65–99)
Glucose-Capillary: 171 mg/dL — ABNORMAL HIGH (ref 65–99)
Glucose-Capillary: 97 mg/dL (ref 65–99)

## 2015-06-25 LAB — CBC
HEMATOCRIT: 26.2 % — AB (ref 35.0–47.0)
Hemoglobin: 8.4 g/dL — ABNORMAL LOW (ref 12.0–16.0)
MCH: 27.5 pg (ref 26.0–34.0)
MCHC: 32.2 g/dL (ref 32.0–36.0)
MCV: 85.3 fL (ref 80.0–100.0)
Platelets: 474 10*3/uL — ABNORMAL HIGH (ref 150–440)
RBC: 3.07 MIL/uL — AB (ref 3.80–5.20)
RDW: 14.5 % (ref 11.5–14.5)
WBC: 32.3 10*3/uL — AB (ref 3.6–11.0)

## 2015-06-25 LAB — TROPONIN I
TROPONIN I: 0.04 ng/mL — AB (ref <0.031)
Troponin I: 0.04 ng/mL — ABNORMAL HIGH (ref <0.031)

## 2015-06-25 LAB — OSMOLALITY: Osmolality: 257 mOsm/kg — ABNORMAL LOW (ref 275–300)

## 2015-06-25 LAB — MRSA PCR SCREENING: MRSA by PCR: NEGATIVE

## 2015-06-25 MED ORDER — FUROSEMIDE 10 MG/ML IJ SOLN
20.0000 mg | Freq: Every day | INTRAMUSCULAR | Status: DC
  Administered 2015-06-25: 20 mg via INTRAVENOUS
  Filled 2015-06-25: qty 2

## 2015-06-25 MED ORDER — CETYLPYRIDINIUM CHLORIDE 0.05 % MT LIQD
7.0000 mL | Freq: Two times a day (BID) | OROMUCOSAL | Status: DC
  Administered 2015-06-25 – 2015-06-26 (×3): 7 mL via OROMUCOSAL

## 2015-06-25 MED ORDER — DIPHENHYDRAMINE HCL 50 MG/ML IJ SOLN
25.0000 mg | Freq: Once | INTRAMUSCULAR | Status: AC
  Administered 2015-06-25: 25 mg via INTRAVENOUS
  Filled 2015-06-25: qty 1

## 2015-06-25 MED ORDER — IOHEXOL 240 MG/ML SOLN
50.0000 mL | INTRAMUSCULAR | Status: AC
  Administered 2015-06-25 (×2): 50 mL via ORAL

## 2015-06-25 MED ORDER — IPRATROPIUM-ALBUTEROL 0.5-2.5 (3) MG/3ML IN SOLN
3.0000 mL | Freq: Four times a day (QID) | RESPIRATORY_TRACT | Status: DC
  Administered 2015-06-25 – 2015-06-27 (×8): 3 mL via RESPIRATORY_TRACT
  Filled 2015-06-25 (×8): qty 3

## 2015-06-25 MED ORDER — VANCOMYCIN HCL 500 MG IV SOLR
500.0000 mg | Freq: Four times a day (QID) | Status: DC
  Administered 2015-06-25 – 2015-06-26 (×3): 500 mg via RECTAL
  Filled 2015-06-25 (×8): qty 500

## 2015-06-25 MED ORDER — DIPHENHYDRAMINE HCL 25 MG PO CAPS
25.0000 mg | ORAL_CAPSULE | ORAL | Status: DC | PRN
  Administered 2015-06-26 – 2015-07-06 (×3): 25 mg via ORAL
  Filled 2015-06-25 (×3): qty 1

## 2015-06-25 NOTE — Consult Note (Signed)
[facility] [place] [facility] Consultation   Thank you, Dr. Clelia Croft for referring this patient, here is my impression.  ASSESSMENT / PLAN: C. difficile colitis --Continue toxic colon, GI following, continue abx.  --Contributing to sepsis.  A-fib --Currently stable.   Pleural effusion --I suspect this is from volume overload and sepsis resulting in mild bibasilar atelectasis. --We'll treat with IV Lasix.  Hyponatremia --Possibly secondary to SIADH. --We'll check urine osmolality and sodium as well as serum osmolality and sodium levels. --The patient is already nothing by mouth.  Hyperglycemia --The patient's blood sugars are currently adequately controlled, she is artery on long-acting insulin and sliding scale insulin. --We'll continue to monitor blood sugars.  Atelectasis --Secondary to bilateral pleural effusions.  Acute respiratory failure --This is likely multifactorial due to bilateral pleural effusions, atelectasis, sepsis, obesity, abdominal distention.  Sepsis --Due to C. difficile colitis     ---------------------------------------   Name: Katherine [name] MRN: [id number] DOB: Apr 12, 1932    ADMISSION DATE:  06/21/2015 CONSULTATION DATE:  06/25/15   REFERRING MD :  Dr. Karlene Lineman  CHIEF COMPLAINT:  Dyspnea.    HISTORY OF PRESENT ILLNESS:  [name] is an 81 year old female with past medical history of refractory HTN on multiple medicaitons, significant anxiety contributing to medical problems, DM, CAD s/p PCI in 2010, atrial fibrillation, and chronic abdominal complaints. She was recently admitted to [facility] with right peri-orbital cellulitis/abscess and treated with abx. That admission was complicated by Afib with RVR as well type 2 cardiac ischemia.  She was admitted here on 7/24 after a stay at rehab, she was falling at home and has been having diarrhea. Subsequently she was diagnosed with C.dif colitis. She is currently on PO Flagyl,  oral vanco and IVIG.  Her wbc count has been trending upwards, currently at 32.  Subsequently, since the time of her admission, she has become increasingly dyspneic. It has been noted that the patient has been using accessory muscles of respiration to breathe and we have been counseled to due to this difficulty. She tells me she has no previous prior history of respiratory disease including asthma, COPD or sleep apnea. She tells me she first started to note that she was dyspneic over the past few nights and particularly noted that it was harder to breathe at night. The patient notes that she has been sleeping in a recliner at home because she is unable to lay flat. Her medication list shows that she was on Lasix 20 mg twice daily at home. The patient is currently nothing by mouth due to C. difficile colitis and there is some concern that she may need to go to the OR. In addition, it is noted that she is hyponatremic with a sodium that has been trending downwards. Currently, she notes that her breathing feels okay when she is laying in bed and sitting still.  The patient has completed a CT scan of the chest and abdomen today. I did review these images. They show bibasilar atelectasis with bibasilar mild to moderate pleural effusions.   PAST MEDICAL HISTORY :  Past Medical History  Diagnosis Date  . Diabetes mellitus without complication   . Hypertension   . CCF (congestive cardiac failure) 06/04/2015  . Varicose veins     of leg  . Bowel habit changes   . Anemia   . Diabetes mellitus with renal manifestation 03/19/2009  . Vitamin D deficiency   . Hyperglyceridemia, pure   . Hyperlipidemia 02/01/2010  .  Hyponatremia   . Mitral valve disorder 04/01/2009  . Atherosclerosis of coronary artery 08/04/2009  . CHF (congestive heart failure)     EF 30-35% on echo 2014  . Allergy 04/04/2009  . GERD (gastroesophageal reflux disease) 11/06/2009  . Hemorrhoids, external without complications 08/04/2009    . Arthritis   . Arthralgia   . Myalgia   . Palpitations   . Chronic nausea   . Malaise and fatigue   . Edema   . Atrial fibrillation 04/01/2009  . Dysuria    Past Surgical History  Procedure Laterality Date  . Tonsillectomy    . Breast surgery    . Abdominal hysterectomy    . Appendectomy    . Hernia repair    . Cholecystectomy    . Stents  2011   Prior to Admission medications   Medication Sig Start Date End Date Taking? Authorizing Provider  aspirin 81 MG tablet Take 81 mg by mouth daily.   Yes Historical Provider, MD  atorvastatin (LIPITOR) 80 MG tablet Take 80 mg by mouth every evening.   Yes Historical Provider, MD  benazepril (LOTENSIN) 40 MG tablet Take 1 tablet by mouth daily.  04/01/15  Yes Historical Provider, MD  bisacodyl (DULCOLAX) 10 MG suppository Place 10 mg rectally daily as needed for mild constipation, moderate constipation or severe constipation.   Yes Historical Provider, MD  carvedilol (COREG) 25 MG tablet Take 25 mg by mouth 2 (two) times daily.   Yes Historical Provider, MD  cloNIDine (CATAPRES) 0.3 MG tablet Take 0.3 mg by mouth 3 (three) times daily.  03/11/15  Yes Historical Provider, MD  diltiazem (CARDIZEM) 120 MG tablet Take 120 mg by mouth daily.   Yes Historical Provider, MD  dorzolamide (TRUSOPT) 2 % ophthalmic solution Place 1 drop into both eyes 3 (three) times daily.   Yes Historical Provider, MD  furosemide (LASIX) 20 MG tablet Take 20 mg by mouth 2 (two) times daily.    Yes Historical Provider, MD  hydrALAZINE (APRESOLINE) 100 MG tablet Take 100 mg by mouth 3 (three) times daily.   Yes Historical Provider, MD  hydrocortisone cream 1 % Apply 1 application topically every 12 (twelve) hours as needed for itching (rash).   Yes Historical Provider, MD  hyoscyamine (LEVSIN, ANASPAZ) 0.125 MG tablet Take 0.125 mg by mouth daily.    Yes Historical Provider, MD  insulin glargine (LANTUS) 100 UNIT/ML injection Inject 50 Units into the skin at bedtime.     Yes Historical Provider, MD  latanoprost (XALATAN) 0.005 % ophthalmic solution Place 1 drop into both eyes at bedtime.  04/30/15  Yes Historical Provider, MD  metoCLOPramide (REGLAN) 5 MG tablet Take 5 mg by mouth 4 (four) times daily -  before meals and at bedtime.   Yes Historical Provider, MD  pantoprazole (PROTONIX) 40 MG tablet Take 40 mg by mouth 2 (two) times daily.   Yes Historical Provider, MD  polyethylene glycol powder (GLYCOLAX/MIRALAX) powder Take 17 g by mouth daily as needed for mild constipation, moderate constipation or severe constipation.    Yes Historical Provider, MD  potassium chloride (KLOR-CON M10) 10 MEQ tablet Take 10 mEq by mouth daily.   Yes Historical Provider, MD  SENNA PO Take 2 tablets by mouth daily.   Yes Historical Provider, MD  sertraline (ZOLOFT) 50 MG tablet Take 50 mg by mouth daily.   Yes Historical Provider, MD  simethicone (MYLICON) 80 MG chewable tablet Chew 80 mg by mouth every 6 (six) hours as  needed for flatulence.   Yes Historical Provider, MD  sitaGLIPtin (JANUVIA) 50 MG tablet Take 50 mg by mouth daily.   Yes Historical Provider, MD  timolol (BETIMOL) 0.25 % ophthalmic solution Place 1 drop into both eyes 2 (two) times daily.   Yes Historical Provider, MD  GLIPIZIDE XL 5 MG 24 hr tablet TAKE 1 TABLET DAILY 06/24/15   Malva Limes, MD   Allergies  Allergen Reactions  . Benzocaine Other (See Comments)    Unknown reaction  . Contrast Media [Iodinated Diagnostic Agents] Other (See Comments)    Tachycardia, SVT  . Other Other (See Comments)    MRI dye--reaction unknown.  Marland Kitchen Penicillin V Potassium Other (See Comments)  . Penicillins Other (See Comments)    Reaction: pt doesn't know, b/c it's been a long time ago.   . Sulfa Antibiotics Other (See Comments)    Reaction: pt doesn't know, b/c it's been a long time ago.     FAMILY HISTORY:  Family History  Problem Relation Age of Onset  . Heart attack Mother   . Cancer Father    SOCIAL HISTORY:   reports that she has never smoked. She does not have any smokeless tobacco history on file. She reports that she does not drink alcohol. Her drug history is not on file.  REVIEW OF SYSTEMS:   Constitutional: Feels well. Cardiovascular: No chest pain.  Pulmonary: Denies dyspnea.   The remainder of systems were reviewed and were found to be negative other than what is documented in the HPI.    VITAL SIGNS: Temp:  [96.1 F (35.6 C)-97.9 F (36.6 C)] 96.7 F (35.9 C) (07/28 0809) Pulse Rate:  [59-74] 59 (07/28 1100) Resp:  [18-22] 22 (07/28 1100) BP: (146-160)/(56-80) 160/56 mmHg (07/28 1100) SpO2:  [94 %-97 %] 97 % (07/28 1100)    INTAKE / OUTPUT:  Intake/Output Summary (Last 24 hours) at 06/25/15 1329 Last data filed at 06/25/15 1005  Gross per 24 hour  Intake    100 ml  Output      0 ml  Net    100 ml    Physical Examination:   VS: BP 160/56 mmHg  Pulse 59  Temp(Src) 96.7 F (35.9 C) (Oral)  Resp 22  Ht 5\' 3"  (1.6 m)  Wt 96.616 kg (213 lb)  BMI 37.74 kg/m2  SpO2 97%  General Appearance: No distress  Neuro:without focal findings, mental status, speech normal, alert and oriented, cranial nerves 2-12 intact, reflexes normal and symmetric, sensation grossly normal  HEENT: PERRLA, EOM intact.  Pulmonary: normal breath sounds., scattered bilateral wheezing.  CardiovascularNormal S1,S2.  No m/r/g.  Abdominal aorta pulsation normal.    Abdomen: Benign, Soft, non-tender, No masses, hepatosplenomegaly, No lymphadenopathy Renal:  No costovertebral tenderness  GU:  No performed at this time. Endoc: No evident thyromegaly, no signs of acromegaly or Cushing features Skin:   warm, no rashes, no ecchymosis  Extremities: normal, no cyanosis, clubbing, no edema, warm with normal capillary refill.    LABS:  CBC  Recent Labs Lab 06/22/15 0408 06/23/15 0528 06/25/15 0510  WBC 27.1* 33.9* 32.3*  HGB 7.7* 8.9* 8.4*  HCT 23.4* 27.0* 26.2*  PLT 397 452* 474*   Coag's No  results for input(s): APTT, INR in the last 168 hours. BMET  Recent Labs Lab 06/21/15 1538 06/22/15 0408 06/23/15 0528 06/25/15 0510  NA 122* 124* 122* 121*  K 3.6 3.5  --  4.4  CL 86* 90*  --  90*  CO2 24 23  --  24  BUN 32* 29*  --  30*  CREATININE 1.29* 1.16* 0.97 1.00  GLUCOSE 225* 122*  --  175*   Electrolytes  Recent Labs Lab 06/21/15 1538 06/22/15 0408 06/25/15 0510  CALCIUM 8.3* 8.1* 8.0*   Sepsis Markers No results for input(s): LATICACIDVEN, PROCALCITON, O2SATVEN in the last 168 hours. ABG No results for input(s): PHART, PCO2ART, PO2ART in the last 168 hours. Liver Enzymes  Recent Labs Lab 06/21/15 1538  AST 16  ALT 12*  ALKPHOS 89  BILITOT 0.4  ALBUMIN 2.5*   Cardiac Enzymes No results for input(s): TROPONINI, PROBNP in the last 168 hours. Glucose  Recent Labs Lab 06/24/15 0754 06/24/15 1122 06/24/15 1623 06/24/15 2119 06/25/15 0807 06/25/15 1109  GLUCAP 177* 263* 260* 248* 171* 165*    Imaging Dg Abd 2 Views  06/24/2015   CLINICAL DATA:  Gaseous abdominal distention.  EXAM: ABDOMEN - 2 VIEW  COMPARISON:  06/22/2015  FINDINGS: Diffuse gaseous distention of the colon is demonstrated, suspicious for ileus. No definite evidence of small bowel obstruction. Right upper quadrant surgical clips are noted. No evidence of free air. Opacity noted in left retrocardiac lung base.  IMPRESSION: Diffuse gaseous distention of colon, suspicious for colonic ileus.  Opacity in left lung base. Consider chest radiograph for further evaluation.   Electronically Signed   By: Myles Rosenthal M.D.   On: 06/24/2015 18:24    --Wells Guiles, MD.  Board Certified in Internal Medicine, Pulmonary Medicine, Critical Care Medicine, and Sleep Medicine.  Pager [phone]  [facility] Santiago Glad, M.D.  Stephanie Acre, M.D.  Carolyne Fiscal, M.D Office Number: [phone]     CRITICAL CARE: Critical Care Attestation.  I have personally  obtained a history, examined the patient, evaluated laboratory and imaging results, formulated the assessment and plan and placed orders. The Patient requires high complexity decision making for assessment and support, frequent evaluation and titration of therapies, application of advanced monitoring technologies and extensive interpretation of multiple databases. The patient has critical illness that could lead imminently to failure of 1 or more organ systems and requires the highest level of physician preparedness to intervene.  Critical Care Time devoted to patient care services described in this note is 35 minutes and is exclusive of time spent in procedures.   06/25/2015, 1:29 PM

## 2015-06-25 NOTE — Progress Notes (Signed)
Inpatient Diabetes Program Recommendations  AACE/ADA: New Consensus Statement on Inpatient Glycemic Control (2013)  Target Ranges:  Prepandial:   less than 140 mg/dL      Peak postprandial:   less than 180 mg/dL (1-2 hours)      Critically ill patients:  140 - 180 mg/dL   Results for Katherine [name], Katherine [name] (MRN [id number]) as of 06/25/2015 10:43  Ref. Range 06/24/2015 07:54 06/24/2015 11:22 06/24/2015 16:23 06/24/2015 21:19 06/25/2015 08:07  Glucose-Capillary Latest Ref Range: 65-99 mg/dL 956 (H) 213 (H) 086 (H) 248 (H) 171 (H)   Current orders for Inpatient glycemic control: Lantus 46 units QAM, Novolog 0-15 units TID with meals, Novolog 0-5 units HS, Glipizide 5 mg QAM, Tradjenta 5 mg daily  Inpatient Diabetes Program Recommendations Insulin - Basal: Please consider increasing Lantus to 50 units QPM. Correction (SSI): Noted patient is now NPO and is being moved to ICU. Please consider increasing Novolog correction to resistant scale and changing frequency of CBGs and Novolog correction to Q4H. Oral DM medications: May want to consider discontinuing Glipizide and Tradjenta at this time since patient has toxic megacolon, worsening c-diff, and is NPO.  Thanks, Orlando Penner, RN, MSN, CCRN, CDE Diabetes Coordinator Inpatient Diabetes Program [phone] (Team Pager from 8am to 5pm) [phone] ([facility] office) [phone] [facility] office) [phone] [facility] office)

## 2015-06-25 NOTE — Assessment & Plan Note (Addendum)
--  Continue toxic colon, GI following, continue abx.  --Contributing to sepsis. -I suspect that this is secondary to recent antibiotics treatment for periorbital cellulitis and abscess completed recently at [facility] approximately one month ago.

## 2015-06-25 NOTE — Consult Note (Signed)
Cardiology Consultation Note  Patient ID: Katherine [name], MRN: [id number], DOB/AGE: 1932-05-14 81 y.o. Admit date: 06/21/2015   Date of Consult: 06/25/2015 Primary Physician: Mila Merry, MD Primary Cardiologist: Changing from [facility] to [facility] for insurance reasons, per the patient  Chief Complaint:  shortness of breath, edema Reason for Consult: Abnormal EKG, shortness of breath, known CAD  HPI: 81 y.o. female with h/o CAD, stent to her LAD in 2010, anemia, hypoalbuminemia,  multiple recent admissions for leg and orbital cellulitis at [facility] requiring IV antibiotic rounds,  presenting with diarrhea, also had a fall, weakness. Diagnosed with C.Diff.  She she has been taking  oral flagyl and developed a leukocytosis of 30,000. Diagnosed with  C.Diff.  Admitted to the hospital,  switched to IV flagyl and PO vanco and IVIG. Diarrhea has improved. She reports chronic distention of her abdomen, chronic shortness of breath particularly worse when supine . She feels somewhat better sleeping in her  "lazy boy ".    + nausea. No fevers, chills, night sweats, chest pain, cough, vomiting, dysuria/hematuria. Chronic swelling of her legs and arms, pitting edema  Past Medical History  Diagnosis Date  . Diabetes mellitus without complication   . Hypertension   . CCF (congestive cardiac failure) 06/04/2015  . Varicose veins     of leg  . Bowel habit changes   . Anemia   . Diabetes mellitus with renal manifestation 03/19/2009  . Vitamin D deficiency   . Hyperglyceridemia, pure   . Hyperlipidemia 02/01/2010  . Hyponatremia   . Mitral valve disorder 04/01/2009  . Atherosclerosis of coronary artery 08/04/2009  . CHF (congestive heart failure)     EF 30-35% on echo 2014  . Allergy 04/04/2009  . GERD (gastroesophageal reflux disease) 11/06/2009  . Hemorrhoids, external without complications 08/04/2009  . Arthritis   . Arthralgia   . Myalgia   . Palpitations   . Chronic nausea   . Malaise and  fatigue   . Edema   . Atrial fibrillation 04/01/2009  . Dysuria       Most Recent Cardiac Studies: Echocardiogram at [facility] June 2016 showing normal LV function, no significant valve disease, no mention of pulmonary hypertension   Stress test at [facility] June 2016 with ischemia in the inferior as well as inferolateral wall    Surgical History:  Past Surgical History  Procedure Laterality Date  . Tonsillectomy    . Breast surgery    . Abdominal hysterectomy    . Appendectomy    . Hernia repair    . Cholecystectomy    . Stents  2011     Home Meds: Prior to Admission medications   Medication Sig Start Date End Date Taking? Authorizing Provider  aspirin 81 MG tablet Take 81 mg by mouth daily.   Yes Historical Provider, MD  atorvastatin (LIPITOR) 80 MG tablet Take 80 mg by mouth every evening.   Yes Historical Provider, MD  benazepril (LOTENSIN) 40 MG tablet Take 1 tablet by mouth daily.  04/01/15  Yes Historical Provider, MD  bisacodyl (DULCOLAX) 10 MG suppository Place 10 mg rectally daily as needed for mild constipation, moderate constipation or severe constipation.   Yes Historical Provider, MD  carvedilol (COREG) 25 MG tablet Take 25 mg by mouth 2 (two) times daily.   Yes Historical Provider, MD  cloNIDine (CATAPRES) 0.3 MG tablet Take 0.3 mg by mouth 3 (three) times daily.  03/11/15  Yes Historical Provider, MD  diltiazem (CARDIZEM) 120 MG tablet Take  120 mg by mouth daily.   Yes Historical Provider, MD  dorzolamide (TRUSOPT) 2 % ophthalmic solution Place 1 drop into both eyes 3 (three) times daily.   Yes Historical Provider, MD  furosemide (LASIX) 20 MG tablet Take 20 mg by mouth 2 (two) times daily.    Yes Historical Provider, MD  hydrALAZINE (APRESOLINE) 100 MG tablet Take 100 mg by mouth 3 (three) times daily.   Yes Historical Provider, MD  hydrocortisone cream 1 % Apply 1 application topically every 12 (twelve) hours as needed for itching (rash).   Yes Historical Provider, MD    hyoscyamine (LEVSIN, ANASPAZ) 0.125 MG tablet Take 0.125 mg by mouth daily.    Yes Historical Provider, MD  insulin glargine (LANTUS) 100 UNIT/ML injection Inject 50 Units into the skin at bedtime.    Yes Historical Provider, MD  latanoprost (XALATAN) 0.005 % ophthalmic solution Place 1 drop into both eyes at bedtime.  04/30/15  Yes Historical Provider, MD  metoCLOPramide (REGLAN) 5 MG tablet Take 5 mg by mouth 4 (four) times daily -  before meals and at bedtime.   Yes Historical Provider, MD  pantoprazole (PROTONIX) 40 MG tablet Take 40 mg by mouth 2 (two) times daily.   Yes Historical Provider, MD  polyethylene glycol powder (GLYCOLAX/MIRALAX) powder Take 17 g by mouth daily as needed for mild constipation, moderate constipation or severe constipation.    Yes Historical Provider, MD  potassium chloride (KLOR-CON M10) 10 MEQ tablet Take 10 mEq by mouth daily.   Yes Historical Provider, MD  SENNA PO Take 2 tablets by mouth daily.   Yes Historical Provider, MD  sertraline (ZOLOFT) 50 MG tablet Take 50 mg by mouth daily.   Yes Historical Provider, MD  simethicone (MYLICON) 80 MG chewable tablet Chew 80 mg by mouth every 6 (six) hours as needed for flatulence.   Yes Historical Provider, MD  sitaGLIPtin (JANUVIA) 50 MG tablet Take 50 mg by mouth daily.   Yes Historical Provider, MD  timolol (BETIMOL) 0.25 % ophthalmic solution Place 1 drop into both eyes 2 (two) times daily.   Yes Historical Provider, MD  GLIPIZIDE XL 5 MG 24 hr tablet TAKE 1 TABLET DAILY 06/24/15   Malva Limes, MD    Inpatient Medications:  . acidophilus  1 capsule Oral BID  . antiseptic oral rinse  7 mL Mouth Rinse BID  . aspirin EC  81 mg Oral QHS  . atorvastatin  80 mg Oral QPM  . benazepril  40 mg Oral BID  . carvedilol  25 mg Oral BID  . diltiazem  120 mg Oral Daily  . dorzolamide  1 drop Both Eyes TID  . feeding supplement (GLUCERNA SHAKE)  237 mL Oral TID WC  . furosemide  20 mg Intravenous Daily  . glipiZIDE  5 mg  Oral Q breakfast  . hydrALAZINE  100 mg Oral TID  . hydrocortisone cream   Topical QID  . imipenem-cilastatin  500 mg Intravenous 3 times per day  . insulin aspart  0-15 Units Subcutaneous TID WC  . insulin aspart  0-5 Units Subcutaneous QHS  . insulin glargine  46 Units Subcutaneous QPM  . ipratropium-albuterol  3 mL Nebulization Q6H  . latanoprost  1 drop Both Eyes QPM  . linagliptin  5 mg Oral Daily  . metronidazole  500 mg Intravenous Q8H  . multivitamin with minerals  1 tablet Oral q morning - 10a  . pantoprazole  40 mg Oral BID  . potassium  chloride  10 mEq Oral Daily  . sertraline  50 mg Oral Daily  . timolol  1 drop Both Eyes BID  . vancomycin  250 mg Oral 4 times per day  . vancomycin (VANCOCIN) rectal ENEMA  500 mg Rectal 4 times per day      Allergies:  Allergies  Allergen Reactions  . Benzocaine Other (See Comments)    Unknown reaction  . Contrast Media [Iodinated Diagnostic Agents] Other (See Comments)    Tachycardia, SVT  . Other Other (See Comments)    MRI dye--reaction unknown.  Marland Kitchen Penicillin V Potassium Other (See Comments)  . Penicillins Other (See Comments)    Reaction: pt doesn't know, b/c it's been a long time ago.   . Sulfa Antibiotics Other (See Comments)    Reaction: pt doesn't know, b/c it's been a long time ago.     History   Social History  . Marital Status: Married    Spouse Name: N/A  . Number of Children: N/A  . Years of Education: N/A   Occupational History  . Not on file.   Social History Main Topics  . Smoking status: Never Smoker   . Smokeless tobacco: Not on file  . Alcohol Use: No  . Drug Use: Not on file  . Sexual Activity: No   Other Topics Concern  . Not on file   Social History Narrative     Family History  Problem Relation Age of Onset  . Heart attack Mother   . Cancer Father      Review of Systems: Telemetry showing normal sinus rhythm Review of Systems  Constitutional: Positive for malaise/fatigue.    Respiratory: Positive for shortness of breath.   Cardiovascular: Positive for leg swelling.  Gastrointestinal:       Distended  Musculoskeletal: Negative.   Neurological: Positive for weakness.  Psychiatric/Behavioral: Negative.   All other systems reviewed and are negative.   Labs:  Recent Labs  06/25/15 1316  TROPONINI 0.04*   Lab Results  Component Value Date   WBC 32.3* 06/25/2015   HGB 8.4* 06/25/2015   HCT 26.2* 06/25/2015   MCV 85.3 06/25/2015   PLT 474* 06/25/2015    Recent Labs Lab 06/21/15 1538  06/25/15 0510  NA 122*  < > 121*  K 3.6  < > 4.4  CL 86*  < > 90*  CO2 24  < > 24  BUN 32*  < > 30*  CREATININE 1.29*  < > 1.00  CALCIUM 8.3*  < > 8.0*  PROT 5.3*  --   --   BILITOT 0.4  --   --   ALKPHOS 89  --   --   ALT 12*  --   --   AST 16  --   --   GLUCOSE 225*  < > 175*  < > = values in this interval not displayed. No results found for: CHOL, HDL, LDLCALC, TRIG No results found for: DDIMER  Radiology/Studies:  Ct Abdomen Pelvis Wo Contrast  06/25/2015   CLINICAL DATA:  Recently on IV antibiotics for high infection. Began to have diarrhea with fall today due to weakness. Positive stool culture for C difficile. Previous hysterectomy, appendectomy and cholecystectomy.  EXAM: CT CHEST, ABDOMEN AND PELVIS WITHOUT CONTRAST  TECHNIQUE: Multidetector CT imaging of the chest, abdomen and pelvis was performed following the standard protocol without IV contrast.  COMPARISON:  CT abdomen/ pelvis 10/28/2009  FINDINGS: CT CHEST FINDINGS  Lungs are adequately inflated demonstrate moderate  size bilateral pleural effusions with associated posterior bibasilar consolidation likely atelectasis. Airways are within normal.  There is no significant hilar or axillary adenopathy. There is a 1 cm right peritracheal lymph node likely reactive. There is mild calcified plaque over the thoracic aorta. Findings suggesting a stent over the left anterior descending coronary artery. There is  mild cardiomegaly. Remaining mediastinal structures are unremarkable.  CT ABDOMEN AND PELVIS FINDINGS  Abdominal images demonstrate evidence of previous cholecystectomy. The liver, spleen, pancreas and adrenal glands are within normal. Kidneys demonstrate no evidence of hydronephrosis or nephrolithiasis. Ureters are within normal. Surgical absence of the appendix.  There is mild calcified plaque over the abdominal aorta and iliac arteries.  There is mild ascites present. There is mild to moderate subcutaneous edema over the flanks and abdominal wall.  Mild diverticulosis of the colon. Mild wall thickening involving the rectosigmoid colon with minimal stranding of the adjacent pericolonic fat as findings may be due to acute colitis. No evidence of perforation.  Remaining pelvic images demonstrate surgical absence of the uterus. The bladder is within normal.  There is mild spondylosis of the spine with moderate disc disease the L2-3 and L3-4 levels.  IMPRESSION: Mild wall thickening involving the rectosigmoid colon which may be due to an acute colitis. No evidence of perforation.  Mild ascites. Subcutaneous edema over the abdominal wall/flanks. Moderate size bilateral pleural effusions with associated compressive atelectasis in the lung bases.  Mild cardiomegaly.  Coronary stent.   Electronically Signed   By: Elberta Fortis M.D.   On: 06/25/2015 13:53   Dg Elbow Complete Left  06/21/2015   CLINICAL DATA:  Patient fell at home with resulting left elbow pain  EXAM: LEFT ELBOW - COMPLETE 3+ VIEW  COMPARISON:  None.  FINDINGS: There is no dislocation. Fracture is not definitively identified. There is mild elevation of the anterior fat pad however.  IMPRESSION: There may be a small elbow joint effusion which implies in the setting of trauma the possibility of a nondisplaced radiographically occult fracture. Correlate with degree of suspicion on clinical examination and consider further imaging with CT scan if indicated.  Alternatively if symptoms persist centered radiographic followup in about 4-7 days.   Electronically Signed   By: Esperanza Heir M.D.   On: 06/21/2015 16:12   Dg Knee 2 Views Left  06/21/2015   CLINICAL DATA:  Fall, left knee pain  EXAM: LEFT KNEE - 1-2 VIEW  COMPARISON:  None.  FINDINGS: No acute fracture or dislocation. No lytic or sclerotic osseous lesion. No joint effusion. 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 Peripheral vascular atherosclerotic disease.  IMPRESSION: No acute osseous injury of the left knee.   Electronically Signed   By: Elige Ko   On: 06/21/2015 16:04   Dg Abd 1 View  06/22/2015   CLINICAL DATA:  Abdominal pain and distention.  EXAM: ABDOMEN - 1 VIEW  COMPARISON:  09/18/2009  FINDINGS: The abdominal gas pattern is negative for obstruction or perforation. There is a generous volume of stool throughout the colon. There are surgical clips in the right upper quadrant consistent with cholecystectomy. No biliary or urinary calculi are evident.  IMPRESSION: Negative for obstruction or perforation.   Electronically Signed   By: Ellery Plunk M.D.   On: 06/22/2015 17:09   Ct Chest Wo Contrast  06/25/2015   CLINICAL DATA:  Recently on IV antibiotics for high infection. Began to have diarrhea with fall today due to weakness. Positive stool culture for C  difficile. Previous hysterectomy, appendectomy and cholecystectomy.  EXAM: CT CHEST, ABDOMEN AND PELVIS WITHOUT CONTRAST  TECHNIQUE: Multidetector CT imaging of the chest, abdomen and pelvis was performed following the standard protocol without IV contrast.  COMPARISON:  CT abdomen/ pelvis 10/28/2009  FINDINGS: CT CHEST FINDINGS  Lungs are adequately inflated demonstrate moderate size bilateral pleural effusions with associated posterior bibasilar consolidation likely atelectasis. Airways are within normal.  There is no significant hilar or axillary adenopathy. There is a 1 cm right peritracheal lymph node likely  reactive. There is mild calcified plaque over the thoracic aorta. Findings suggesting a stent over the left anterior descending coronary artery. There is mild cardiomegaly. Remaining mediastinal structures are unremarkable.  CT ABDOMEN AND PELVIS FINDINGS  Abdominal images demonstrate evidence of previous cholecystectomy. The liver, spleen, pancreas and adrenal glands are within normal. Kidneys demonstrate no evidence of hydronephrosis or nephrolithiasis. Ureters are within normal. Surgical absence of the appendix.  There is mild calcified plaque over the abdominal aorta and iliac arteries.  There is mild ascites present. There is mild to moderate subcutaneous edema over the flanks and abdominal wall.  Mild diverticulosis of the colon. Mild wall thickening involving the rectosigmoid colon with minimal stranding of the adjacent pericolonic fat as findings may be due to acute colitis. No evidence of perforation.  Remaining pelvic images demonstrate surgical absence of the uterus. The bladder is within normal.  There is mild spondylosis of the spine with moderate disc disease the L2-3 and L3-4 levels.  IMPRESSION: Mild wall thickening involving the rectosigmoid colon which may be due to an acute colitis. No evidence of perforation.  Mild ascites. Subcutaneous edema over the abdominal wall/flanks. Moderate size bilateral pleural effusions with associated compressive atelectasis in the lung bases.  Mild cardiomegaly.  Coronary stent.   Electronically Signed   By: Elberta Fortis M.D.   On: 06/25/2015 13:53   Dg Abd 2 Views  06/24/2015   CLINICAL DATA:  Gaseous abdominal distention.  EXAM: ABDOMEN - 2 VIEW  COMPARISON:  06/22/2015  FINDINGS: Diffuse gaseous distention of the colon is demonstrated, suspicious for ileus. No definite evidence of small bowel obstruction. Right upper quadrant surgical clips are noted. No evidence of free air. Opacity noted in left retrocardiac lung base.  IMPRESSION: Diffuse gaseous  distention of colon, suspicious for colonic ileus.  Opacity in left lung base. Consider chest radiograph for further evaluation.   Electronically Signed   By: Myles Rosenthal M.D.   On: 06/24/2015 18:24   Dg Hip Unilat With Pelvis 2-3 Views Left  06/21/2015   CLINICAL DATA:  Fall  EXAM: DG HIP (WITH OR WITHOUT PELVIS) 2-3V LEFT  COMPARISON:  None.  FINDINGS: There is no evidence of hip fracture or dislocation. There is generalized osteopenia. There is no evidence of arthropathy or other focal bone abnormality. There is peripheral vascular atherosclerotic disease.  IMPRESSION: No acute osseous injury of the left hip.   Electronically Signed   By: Elige Ko   On: 06/21/2015 16:04    EKG: EKG shows normal sinus rhythm with right bundle branch block, no change from prior EKGs Nonspecific ST abnormality likely secondary to bundle branch block  Weights: Filed Weights   06/21/15 1508  Weight: 213 lb (96.616 kg)     Physical Exam: Telemetry showing normal sinus rhythm Blood pressure 170/62, pulse 54, temperature 96.7 F (35.9 C), temperature source Oral, resp. rate 19, height 5\' 3"  (1.6 m), weight 213 lb (96.616 kg), SpO2 98 %. Body mass  index is 37.74 kg/(m^2). General: Well developed, well nourished, in no acute distress. Head: Normocephalic, atraumatic, sclera non-icteric, no xanthomas, nares are without discharge.  Neck: Negative for carotid bruits. JVD not elevated. Lungs: Clear bilaterally to auscultation without wheezes, rales, or rhonchi. Breathing is unlabored. Heart: RRR with S1 S2. No murmurs, rubs, or gallops appreciated. Abdomen: Soft, non-tender, non-distended with normoactive bowel sounds. No hepatomegaly. No rebound/guarding. No obvious abdominal masses. Msk:  Strength and tone appear normal for age. Extremities: No clubbing or cyanosis. No edema.  Distal pedal pulses are 2+ and equal bilaterally. Neuro: Alert and oriented X 3. No facial asymmetry. No focal deficit. Moves all  extremities spontaneously. Psych:  Responds to questions appropriately with a normal affect.    Assessment and Plan:   81 yr old W F with multiple medical problmes got out of rehab after having treated with IV abxs for Orbital cellulitis. Presented after a fall at home and ongoing diarrhea.   1. C. Diff: Collitis:   abd x-ray showed ileus.  Dr Markham Jordan and Dr Juliann Pulse following Currently on IV flagyl, PO vanco and imipenem per Dr Markham Jordan,  IVIG   Vanco enema and T abd-pelvis pending  2. EKG changes:  Review of the EKG shows chronic right bundle branch block, no significant changes Initial set of cardiac enzymes negative No symptoms of angina at this time We will monitor. Would continue to cycle cardiac enzymes  3. Hyponatremia:  Secondary to fluid loss.  Would agree with gentle IV fluids  4. Leukocytosis:  Sec to c.dif. Worsening.  5.shortness of breath This is a chronic issue dating back more than one year Likely secondary to grossly distended abdomen and pressure She feels better in a slightly inclined position. Patient repositioned. She appears relatively comfortable  6. Anasarca Secondary to profound anemia,  low albumen, 2.5, third spacing Pitting edema of arms and legs If unable to gain adequate by mouth intake, may need central line for TPN  7. CAD Prior stent to her LAD Stress test at [facility] June 2016 with regions of ischemia High risk of attenuation artifact given her size No clinical indication of active ischemia Medical management at this time No need to repeat echocardiogram, had normal EF last month  8. Atrial fibrillation Unclear if she is having paroxysmal atrial fibrillation Prior EKGs in our system show EKGs labeled as atrial fibrillation when they are in fact normal sinus rhythm with ectopy We'll continue to monitor Currently not on anticoagulation given prior GI bleeding, significant anemia   CODE STATUS: full  Signed, Dossie Arbour, MD [facility]  [facility] 06/25/2015, 4:30 PM

## 2015-06-25 NOTE — Progress Notes (Signed)
PT Cancellation Note  Patient Details Name: Katherine [name] MRN: [id number] DOB: 12-14-1931   Cancelled Treatment:    Reason Eval/Treat Not Completed: Medical issues which prohibited therapy (Pt transferred to CCU.)  D/t change in medical status and transfer to CCU, will require new PT consult when pt is medically appropriate to participate in PT.  Will complete PT order at this time.   Irving Burton Damien Batty 06/25/2015, 3:53 PM Hendricks Limes, PT [phone]

## 2015-06-25 NOTE — Assessment & Plan Note (Signed)
Currently stable.

## 2015-06-25 NOTE — Consult Note (Signed)
GI Inpatient Consult Note  Reason for Consult:C. Diff colitis   Attending Requesting Consult:Dr. Nicki Reaper  History of Present Illness: Katherine [name] is a 81 y.o. female who was treated for an eye infection and developed C. Diff colits and has elevated WBC to 36,000, abd distention and diarrhea positive for C. Diff.   Past Medical History:  Past Medical History  Diagnosis Date  . Diabetes mellitus without complication   . Hypertension   . CCF (congestive cardiac failure) 06/04/2015  . Varicose veins     of leg  . Bowel habit changes   . Anemia   . Diabetes mellitus with renal manifestation 03/19/2009  . Vitamin D deficiency   . Hyperglyceridemia, pure   . Hyperlipidemia 02/01/2010  . Hyponatremia   . Mitral valve disorder 04/01/2009  . Atherosclerosis of coronary artery 08/04/2009  . CHF (congestive heart failure)     EF 30-35% on echo 2014  . Allergy 04/04/2009  . GERD (gastroesophageal reflux disease) 11/06/2009  . Hemorrhoids, external without complications 08/04/2009  . Arthritis   . Arthralgia   . Myalgia   . Palpitations   . Chronic nausea   . Malaise and fatigue   . Edema   . Atrial fibrillation 04/01/2009  . Dysuria     Problem List: Patient Active Problem List   Diagnosis Date Noted  . Hyponatremia 06/25/2015  . Acute respiratory failure 06/25/2015  . Hyperglycemia 06/25/2015  . Pleural effusion 06/25/2015  . Atelectasis 06/25/2015  . Sepsis 06/25/2015  . Abdominal distention   . Edema   . Fall   . Ileus   . Abnormal EKG   . C. difficile colitis 06/21/2015  . Ache in joint 06/04/2015  . Arthritis 06/04/2015  . CCF (congestive cardiac failure) 06/04/2015  . Feeling bilious 06/04/2015  . Arteriosclerosis of coronary artery 06/04/2015  . Difficult or painful urination 06/04/2015  . Dermatitis, eczematoid 06/04/2015  . Essential (primary) hypertension 06/04/2015  . Fatigue 06/04/2015  . Hyperglyceridemia, pure 06/04/2015  .  Hypertriglyceridemia 06/04/2015  . Below normal amount of sodium in the blood 06/04/2015  . Muscle ache 06/04/2015  . Awareness of heartbeats 06/04/2015  . Asymptomatic varicose veins 06/04/2015  . Avitaminosis D 06/04/2015  . Diabetes mellitus, type 2 05/05/2015  . A-fib 05/04/2015  . Glaucoma 05/04/2015  . Acute non-ST segment elevation myocardial infarction 05/04/2015  . Edema leg 01/15/2015  . AF (paroxysmal atrial fibrillation) 01/15/2015  . Malaise and fatigue 05/25/2010  . HLD (hyperlipidemia) 02/01/2010  . Acid reflux 11/06/2009  . Absolute anemia 10/09/2009  . Atherosclerosis of coronary artery 08/04/2009  . Hemorrhoids without complication 08/04/2009  . Allergic rhinitis 04/04/2009  . Disorder of mitral valve 04/01/2009  . Diabetes 03/19/2009    Past Surgical History: Past Surgical History  Procedure Laterality Date  . Tonsillectomy    . Breast surgery    . Abdominal hysterectomy    . Appendectomy    . Hernia repair    . Cholecystectomy    . Stents  2011    Allergies: Allergies  Allergen Reactions  . Benzocaine Other (See Comments)    Unknown reaction  . Contrast Media [Iodinated Diagnostic Agents] Other (See Comments)    Tachycardia, SVT  . Other Other (See Comments)    MRI dye--reaction unknown.  Marland Kitchen Penicillin V Potassium Other (See Comments)  . Penicillins Other (See Comments)    Reaction: pt doesn't know, b/c it's been a long time ago.   . Sulfa Antibiotics Other (See  Comments)    Reaction: pt doesn't know, b/c it's been a long time ago.     Home Medications: Prescriptions prior to admission  Medication Sig Dispense Refill Last Dose  . aspirin 81 MG tablet Take 81 mg by mouth daily.   unknown  . atorvastatin (LIPITOR) 80 MG tablet Take 80 mg by mouth every evening.   unknown  . benazepril (LOTENSIN) 40 MG tablet Take 1 tablet by mouth daily.    unknown  . bisacodyl (DULCOLAX) 10 MG suppository Place 10 mg rectally daily as needed for mild  constipation, moderate constipation or severe constipation.   unknown  . carvedilol (COREG) 25 MG tablet Take 25 mg by mouth 2 (two) times daily.   unknown  . [EXPIRED] cephALEXin (KEFLEX) 500 MG capsule Take 500 mg by mouth every 6 (six) hours. X 10 days for cellulitis   unknown  . cloNIDine (CATAPRES) 0.3 MG tablet Take 0.3 mg by mouth 3 (three) times daily.    unknown  . diltiazem (CARDIZEM) 120 MG tablet Take 120 mg by mouth daily.   unknown  . dorzolamide (TRUSOPT) 2 % ophthalmic solution Place 1 drop into both eyes 3 (three) times daily.   unknown  . furosemide (LASIX) 20 MG tablet Take 20 mg by mouth 2 (two) times daily.    unknown  . hydrALAZINE (APRESOLINE) 100 MG tablet Take 100 mg by mouth 3 (three) times daily.   unknown  . hydrocortisone cream 1 % Apply 1 application topically every 12 (twelve) hours as needed for itching (rash).   unknown  . hyoscyamine (LEVSIN, ANASPAZ) 0.125 MG tablet Take 0.125 mg by mouth daily.    unknown  . insulin glargine (LANTUS) 100 UNIT/ML injection Inject 50 Units into the skin at bedtime.    unknown  . latanoprost (XALATAN) 0.005 % ophthalmic solution Place 1 drop into both eyes at bedtime.    unknown  . metoCLOPramide (REGLAN) 5 MG tablet Take 5 mg by mouth 4 (four) times daily -  before meals and at bedtime.   unknown  . pantoprazole (PROTONIX) 40 MG tablet Take 40 mg by mouth 2 (two) times daily.   unknown  . polyethylene glycol powder (GLYCOLAX/MIRALAX) powder Take 17 g by mouth daily as needed for mild constipation, moderate constipation or severe constipation.    unknown  . potassium chloride (KLOR-CON M10) 10 MEQ tablet Take 10 mEq by mouth daily.   unknown  . SENNA PO Take 2 tablets by mouth daily.   unknown  . sertraline (ZOLOFT) 50 MG tablet Take 50 mg by mouth daily.   unknown  . simethicone (MYLICON) 80 MG chewable tablet Chew 80 mg by mouth every 6 (six) hours as needed for flatulence.   unknown  . sitaGLIPtin (JANUVIA) 50 MG tablet Take 50  mg by mouth daily.   06/21/2015 at Unknown time  . timolol (BETIMOL) 0.25 % ophthalmic solution Place 1 drop into both eyes 2 (two) times daily.   unknown   Home medication reconciliation was completed with the patient.   Scheduled Inpatient Medications:   . acidophilus  1 capsule Oral BID  . antiseptic oral rinse  7 mL Mouth Rinse BID  . aspirin EC  81 mg Oral QHS  . atorvastatin  80 mg Oral QPM  . benazepril  40 mg Oral BID  . carvedilol  25 mg Oral BID  . diltiazem  120 mg Oral Daily  . dorzolamide  1 drop Both Eyes TID  . feeding supplement (  GLUCERNA SHAKE)  237 mL Oral TID WC  . furosemide  20 mg Intravenous Daily  . glipiZIDE  5 mg Oral Q breakfast  . hydrALAZINE  100 mg Oral TID  . hydrocortisone cream   Topical QID  . imipenem-cilastatin  500 mg Intravenous 3 times per day  . insulin aspart  0-15 Units Subcutaneous TID WC  . insulin aspart  0-5 Units Subcutaneous QHS  . insulin glargine  46 Units Subcutaneous QPM  . ipratropium-albuterol  3 mL Nebulization Q6H  . latanoprost  1 drop Both Eyes QPM  . linagliptin  5 mg Oral Daily  . metronidazole  500 mg Intravenous Q8H  . multivitamin with minerals  1 tablet Oral q morning - 10a  . pantoprazole  40 mg Oral BID  . potassium chloride  10 mEq Oral Daily  . sertraline  50 mg Oral Daily  . timolol  1 drop Both Eyes BID  . vancomycin  250 mg Oral 4 times per day  . vancomycin (VANCOCIN) rectal ENEMA  500 mg Rectal 4 times per day    Continuous Inpatient Infusions:     PRN Inpatient Medications:  albuterol, alum & mag hydroxide-simeth, ondansetron (ZOFRAN) IV, traMADol  Family History: family history includes Cancer in her father; Heart attack in her mother.  The patient's family history is negative for inflammatory bowel disorders, GI malignancy, or solid organ transplantation.  Social History:   reports that she has never smoked. She does not have any smokeless tobacco history on file. She reports that she does not  drink alcohol. The patient denies ETOH, tobacco, or drug use.   Review of Systems: Constitutional: Weight is stable.  Eyes: No changes in vision. ENT: No oral lesions, sore throat. Complains of tightness and fullness in neck/throat area GI: see HPI.  Heme/Lymph: No easy bruising.  CV: No chest pain.  GU: No hematuria.  Integumentary: No rashes.  Neuro: No headaches.  Psych: No depression/anxiety.  Endocrine: No heat/cold intolerance.  Allergic/Immunologic: No urticaria.  Resp: No cough, SOB.  Musculoskeletal: No joint swelling.    Physical Examination: BP 170/62 mmHg  Pulse 54  Temp(Src) 96.7 F (35.9 C) (Oral)  Resp 19  Ht 5\' 3"  (1.6 m)  Wt 96.616 kg (213 lb)  BMI 37.74 kg/m2  SpO2 98% Gen: NAD, alert and oriented x 4 HEENT: PEERLA, EOMI, Neck: supple, no JVD or thyromegaly Chest: CTA bilaterally, no wheezes, crackles, or other adventitious sounds CV: heart rate 56 Abd: distended, bowel sounds decreased but present, no peritoneal signs, no rebound tenderness liver percussion normal sounds Ext: 1 plus edema of both lower legs Skin: no rash or lesions noted Lymph: no LAD  Data: Lab Results  Component Value Date   WBC 32.3* 06/25/2015   HGB 8.4* 06/25/2015   HCT 26.2* 06/25/2015   MCV 85.3 06/25/2015   PLT 474* 06/25/2015    Recent Labs Lab 06/22/15 0408 06/23/15 0528 06/25/15 0510  HGB 7.7* 8.9* 8.4*   Lab Results  Component Value Date   NA 121* 06/25/2015   K 4.4 06/25/2015   CL 90* 06/25/2015   CO2 24 06/25/2015   BUN 30* 06/25/2015   CREATININE 1.00 06/25/2015   GLU 155 04/14/2015   Lab Results  Component Value Date   ALT 12* 06/21/2015   AST 16 06/21/2015   ALKPHOS 89 06/21/2015   BILITOT 0.4 06/21/2015   No results for input(s): APTT, INR, PTT in the last 168 hours. Assessment/Plan: Ms. Samad is a 81  y.o. female with serious C. Diff colitis, with  signif leukocytosis, hyponatremia, fluid overload with fluid in both lung areas.  Her  hyponatrema is likely SIADH from decreased perfusion due to hypoalbuminemia, possible CHF.  Would be careful with hydration given pleural effusions.  She likely has had toxin damage to colon due to C. Diff associated toxins.  This causes weeping of protein into GI tract and she likely is deficient in gamma globulins as well as albumin so I transfused her with one dose of IVIG /Kg total of 40 grams.  Hopefully will see WBC come down soon.  When WBC comes down below 12-13 range would stop the Broad spectrum antibiotic and continue oral and rectal vanc and iv flagyl.  Will follow with you.  Recommendations: See assessment and plan.  Thank you for the consult. Please call with questions or concerns.I will follow with you.  Lynnae Prude, MD

## 2015-06-25 NOTE — Progress Notes (Signed)
Per MD patient is transferring to ICU. Clinical Child psychotherapist (CSW) received call from patient's daughter in law [place] and made her aware of above. CSW contacted [facility] admissions coordinator at [employer] and made him aware of above. CSW gave report to ICU CSW. CSW will continue to follow and assist as needed.   Jetta Lout, LCSWA [phone]

## 2015-06-25 NOTE — Assessment & Plan Note (Addendum)
--  I suspect this is from volume overload and sepsis resulting in mild bibasilar atelectasis. --Was given IV Lasix will continue to monitor

## 2015-06-25 NOTE — Assessment & Plan Note (Addendum)
Nephro following 

## 2015-06-25 NOTE — Assessment & Plan Note (Signed)
--  Secondary to bilateral pleural effusions.

## 2015-06-25 NOTE — Progress Notes (Signed)
Pt. D/c'd to CCU. Report called to [facility] in CCU.

## 2015-06-25 NOTE — Progress Notes (Signed)
Pt alert and oriented, no complaints of pain.  Sinus brady with BBB and ST depression, cardiology following.  2L [place], pt npo.  Abdomen remains distended but improving, bowel sounds more audible and pt now having small liquids stools.  VSS, afebrile.

## 2015-06-25 NOTE — Progress Notes (Signed)
Pt complains that feels like her throat is swelling.  Pt currently in no respiratory distress, O2 sats 98%.  Pt also appears to be having ST depression on telemetry and bradycardia (HR 52-56).  No complaints of chest pain.  Notified Dr. Sherryll Burger of pt report and questionable ST depression.  Per Dr. Sherryll Burger, obtain STAT EKG and consult Dr. Belia Heman and consult Cardiology.

## 2015-06-25 NOTE — Consult Note (Signed)
Pt with C. Diff and abd distention and fluid on X-ray.  Her exam shows a somewhat distended abdomen but soft and no peritoneal signs and some bowel sounds.  I think it likely that her abd will improve over next 24-48 hours baring any cardiopulmonary setback.  Would go ahead with oral and rectal vancomycin    And IV flagyl.  Check daily CBC and electrolytes.  Full consult to follow.

## 2015-06-25 NOTE — Assessment & Plan Note (Signed)
--  Due to C. difficile colitis

## 2015-06-25 NOTE — Assessment & Plan Note (Signed)
--  The patient's blood sugars are currently adequately controlled, she is artery on long-acting insulin and sliding scale insulin. --We'll continue to monitor blood sugars.

## 2015-06-25 NOTE — Progress Notes (Signed)
[facility] [place] at [facility]   PATIENT NAME: Katherine [name]    MR#:  [id number]  DATE OF BIRTH:  07-Jun-1932  SUBJECTIVE:  Feels extremely weak and tired. abd still distended. REVIEW OF SYSTEMS:   Review of Systems  Constitutional: Negative for fever, chills and weight loss.  HENT: Negative for ear discharge, ear pain and nosebleeds.   Eyes: Negative for blurred vision, pain and discharge.  Respiratory: Positive for shortness of breath. Negative for sputum production, wheezing and stridor.   Cardiovascular: Positive for leg swelling. Negative for chest pain, palpitations, orthopnea and PND.  Gastrointestinal: Positive for nausea, abdominal pain and diarrhea. Negative for vomiting.  Genitourinary: Negative for urgency and frequency.  Musculoskeletal: Negative for back pain and joint pain.  Neurological: Positive for weakness. Negative for sensory change, speech change and focal weakness.  Psychiatric/Behavioral: Negative for depression. The patient is not nervous/anxious.   All other systems reviewed and are negative.  Tolerating Diet: not able to eat much, very nauseous and distended abdomen Tolerating PT: eval pending DRUG ALLERGIES:   Allergies  Allergen Reactions  . Benzocaine Other (See Comments)    Unknown reaction  . Contrast Media [Iodinated Diagnostic Agents] Other (See Comments)    Tachycardia, SVT  . Other Other (See Comments)    MRI dye--reaction unknown.  Marland Kitchen Penicillin V Potassium Other (See Comments)  . Penicillins Other (See Comments)    Reaction: pt doesn't know, b/c it's been a long time ago.   . Sulfa Antibiotics Other (See Comments)    Reaction: pt doesn't know, b/c it's been a long time ago.    VITALS:  Blood pressure 150/46, pulse 50, temperature 96.7 F (35.9 C), temperature source Oral, resp. rate 18, height  (1.6 m), weight 96.616 kg (213 lb), SpO2 99 %. PHYSICAL EXAMINATION:  Physical Exam  Constitutional: She  is oriented to person, place, and time and well-developed, well-nourished, and in no distress.  HENT:  Head: Normocephalic and atraumatic.  Eyes: Conjunctivae and EOM are normal. Pupils are equal, round, and reactive to light.  Neck: Normal range of motion. Neck supple. No tracheal deviation present. No thyromegaly present.  Cardiovascular: Normal rate, regular rhythm and normal heart sounds.   Pulmonary/Chest: Effort normal and breath sounds normal. No respiratory distress. She has no wheezes. She exhibits no tenderness.  Abdominal: Soft. Bowel sounds are normal. She exhibits distension (gaseous). There is tenderness.  Musculoskeletal: Normal range of motion.  Neurological: She is alert and oriented to person, place, and time. No cranial nerve deficit.  Skin: Skin is warm and dry. No rash noted.  Psychiatric: Mood and affect normal.   LABORATORY PANEL:   CBC  Recent Labs Lab 06/25/15 0510  WBC 32.3*  HGB 8.4*  HCT 26.2*  PLT 474*    Chemistries   Recent Labs Lab 06/21/15 1538  06/25/15 0510  NA 122*  < > 121*  K 3.6  < > 4.4  CL 86*  < > 90*  CO2 24  < > 24  GLUCOSE 225*  < > 175*  BUN 32*  < > 30*  CREATININE 1.29*  < > 1.00  CALCIUM 8.3*  < > 8.0*  AST 16  --   --   ALT 12*  --   --   ALKPHOS 89  --   --   BILITOT 0.4  --   --   < > = values in this interval not displayed.  Cardiac Enzymes  Recent  Labs Lab 06/25/15 1316  TROPONINI 0.04*    RADIOLOGY:  Ct Abdomen Pelvis Wo Contrast  06/25/2015   CLINICAL DATA:  Recently on IV antibiotics for high infection. Began to have diarrhea with fall today due to weakness. Positive stool culture for C difficile. Previous hysterectomy, appendectomy and cholecystectomy.  EXAM: CT CHEST, ABDOMEN AND PELVIS WITHOUT CONTRAST  TECHNIQUE: Multidetector CT imaging of the chest, abdomen and pelvis was performed following the standard protocol without IV contrast.  COMPARISON:  CT abdomen/ pelvis 10/28/2009  FINDINGS: CT CHEST  FINDINGS  Lungs are adequately inflated demonstrate moderate size bilateral pleural effusions with associated posterior bibasilar consolidation likely atelectasis. Airways are within normal.  There is no significant hilar or axillary adenopathy. There is a 1 cm right peritracheal lymph node likely reactive. There is mild calcified plaque over the thoracic aorta. Findings suggesting a stent over the left anterior descending coronary artery. There is mild cardiomegaly. Remaining mediastinal structures are unremarkable.  CT ABDOMEN AND PELVIS FINDINGS  Abdominal images demonstrate evidence of previous cholecystectomy. The liver, spleen, pancreas and adrenal glands are within normal. Kidneys demonstrate no evidence of hydronephrosis or nephrolithiasis. Ureters are within normal. Surgical absence of the appendix.  There is mild calcified plaque over the abdominal aorta and iliac arteries.  There is mild ascites present. There is mild to moderate subcutaneous edema over the flanks and abdominal wall.  Mild diverticulosis of the colon. Mild wall thickening involving the rectosigmoid colon with minimal stranding of the adjacent pericolonic fat as findings may be due to acute colitis. No evidence of perforation.  Remaining pelvic images demonstrate surgical absence of the uterus. The bladder is within normal.  There is mild spondylosis of the spine with moderate disc disease the L2-3 and L3-4 levels.  IMPRESSION: Mild wall thickening involving the rectosigmoid colon which may be due to an acute colitis. No evidence of perforation.  Mild ascites. Subcutaneous edema over the abdominal wall/flanks. Moderate size bilateral pleural effusions with associated compressive atelectasis in the lung bases.  Mild cardiomegaly.  Coronary stent.   Electronically Signed   By: Elberta Fortis M.D.   On: 06/25/2015 13:53   Ct Chest Wo Contrast  06/25/2015   CLINICAL DATA:  Recently on IV antibiotics for high infection. Began to have diarrhea  with fall today due to weakness. Positive stool culture for C difficile. Previous hysterectomy, appendectomy and cholecystectomy.  EXAM: CT CHEST, ABDOMEN AND PELVIS WITHOUT CONTRAST  TECHNIQUE: Multidetector CT imaging of the chest, abdomen and pelvis was performed following the standard protocol without IV contrast.  COMPARISON:  CT abdomen/ pelvis 10/28/2009  FINDINGS: CT CHEST FINDINGS  Lungs are adequately inflated demonstrate moderate size bilateral pleural effusions with associated posterior bibasilar consolidation likely atelectasis. Airways are within normal.  There is no significant hilar or axillary adenopathy. There is a 1 cm right peritracheal lymph node likely reactive. There is mild calcified plaque over the thoracic aorta. Findings suggesting a stent over the left anterior descending coronary artery. There is mild cardiomegaly. Remaining mediastinal structures are unremarkable.  CT ABDOMEN AND PELVIS FINDINGS  Abdominal images demonstrate evidence of previous cholecystectomy. The liver, spleen, pancreas and adrenal glands are within normal. Kidneys demonstrate no evidence of hydronephrosis or nephrolithiasis. Ureters are within normal. Surgical absence of the appendix.  There is mild calcified plaque over the abdominal aorta and iliac arteries.  There is mild ascites present. There is mild to moderate subcutaneous edema over the flanks and abdominal wall.  Mild diverticulosis of the  colon. Mild wall thickening involving the rectosigmoid colon with minimal stranding of the adjacent pericolonic fat as findings may be due to acute colitis. No evidence of perforation.  Remaining pelvic images demonstrate surgical absence of the uterus. The bladder is within normal.  There is mild spondylosis of the spine with moderate disc disease the L2-3 and L3-4 levels.  IMPRESSION: Mild wall thickening involving the rectosigmoid colon which may be due to an acute colitis. No evidence of perforation.  Mild ascites.  Subcutaneous edema over the abdominal wall/flanks. Moderate size bilateral pleural effusions with associated compressive atelectasis in the lung bases.  Mild cardiomegaly.  Coronary stent.   Electronically Signed   By: Elberta Fortis M.D.   On: 06/25/2015 13:53   Dg Abd 2 Views  06/24/2015   CLINICAL DATA:  Gaseous abdominal distention.  EXAM: ABDOMEN - 2 VIEW  COMPARISON:  06/22/2015  FINDINGS: Diffuse gaseous distention of the colon is demonstrated, suspicious for ileus. No definite evidence of small bowel obstruction. Right upper quadrant surgical clips are noted. No evidence of free air. Opacity noted in left retrocardiac lung base.  IMPRESSION: Diffuse gaseous distention of colon, suspicious for colonic ileus.  Opacity in left lung base. Consider chest radiograph for further evaluation.   Electronically Signed   By: Myles Rosenthal M.D.   On: 06/24/2015 18:24   ASSESSMENT AND PLAN:  81 yr old W F with multiple medical problmes got out of rehab after having treated with IV abxs for Orbital cellulitis. Presented after a fall at home and ongoing diarrhea.   1. C. Diff: Collitis: she looks worse and toxic appearing. abd x-ray showed ileus. Had long d/w Dr Markham Jordan and Dr Juliann Pulse. - continue IV flagyl, PO vanco and imipenem per Dr Markham Jordan, given IVIG y'day once. - will add Vanco enema and get CT abd-pelvis. Also transfer her to CCU for close monitoring - if gets worse - may require surgery, family aware and updated by Dr Juliann Pulse this am. I talked with son y'day  2. EKG changes: concerning for ST Depressions, will get troponins, cardio c/s - d/w dr Mariah Milling  3. Hyponatremia: Secondary to fluid loss. IV NS. monitor  4. Leukocytosis: Sec to c.dif. Worsening.  5.left elbow pain after fall with abnromal xray -appreciate ortho input - no pathology Resume PT  CSW/CM for d/c planning  6. Acute Renal failure: Secondary to fluid loss from diarrhea. Hold diuretics and give IVF.  Case discussed with Care  Management/Social Worker. Management plans discussed with the patient,  and Dr Markham Jordan  CODE STATUS: full  DVT Prophylaxis: heparin  TOTAL TIME (Critical care) TAKING CARE OF THIS PATIENT: 30 minutes.   She looks clinically worse and high risk for cardio-resp failure, multi-organ failure and death.  >50% time spent on counselling and coordination of care - talking with patient and Dr Markham Jordan  POSSIBLE D/C IN 2-3 DAYS, DEPENDING ON CLINICAL CONDITION.   [facility], Pessy Delamar M.D on 06/25/2015 at 3:12 PM  Between 7am to 6pm - Pager - [phone]  After 6pm go to [email or web address] - password [email or web address] [facility]  [place]  Office  [phone]  CC: Primary care physician; Mila Merry, MD

## 2015-06-25 NOTE — Assessment & Plan Note (Signed)
--  This is likely multifactorial due to bilateral pleural effusions, atelectasis, sepsis, obesity, abdominal distention.

## 2015-06-25 NOTE — Consult Note (Signed)
CC: C diff colitis, abdominal distention  HPI: Katherine [name] is a pleasant 81 yo F with multiple medical issues including multiple recent admissions for leg and orbital cellulitis requiring antibiotic rounds as well as elevated troponins when this was happening who presented with diarrhea.  She was began on oral flagyl and developed a leukocytosis of 30,000.  She has become more distended the past few days and was switched to IV flagyl and PO vanco and IVIG.  Diarrhea has improved.  Worse complaint is the distention and some uncomfortable breathing. + nausea.  No fevers, chills, night sweats, chest pain, cough, vomiting, dysuria/hematuria.  Active Ambulatory Problems    Diagnosis Date Noted  . Allergic rhinitis 04/04/2009  . Absolute anemia 10/09/2009  . Ache in joint 06/04/2015  . Arthritis 06/04/2015  . Atherosclerosis of coronary artery 08/04/2009  . A-fib 05/04/2015  . Edema leg 01/15/2015  . CCF (congestive cardiac failure) 06/04/2015  . Feeling bilious 06/04/2015  . Arteriosclerosis of coronary artery 06/04/2015  . Diabetes 03/19/2009  . Difficult or painful urination 06/04/2015  . Dermatitis, eczematoid 06/04/2015  . Essential (primary) hypertension 06/04/2015  . Fatigue 06/04/2015  . Acid reflux 11/06/2009  . Glaucoma 05/04/2015  . Hemorrhoids without complication 08/04/2009  . Hyperglyceridemia, pure 06/04/2015  . HLD (hyperlipidemia) 02/01/2010  . Hypertriglyceridemia 06/04/2015  . Below normal amount of sodium in the blood 06/04/2015  . Malaise and fatigue 05/25/2010  . Disorder of mitral valve 04/01/2009  . Muscle ache 06/04/2015  . Acute non-ST segment elevation myocardial infarction 05/04/2015  . Awareness of heartbeats 06/04/2015  . AF (paroxysmal atrial fibrillation) 01/15/2015  . Diabetes mellitus, type 2 05/05/2015  . Asymptomatic varicose veins 06/04/2015  . Avitaminosis D 06/04/2015   Resolved Ambulatory Problems    Diagnosis Date Noted  . No Resolved  Ambulatory Problems   Past Medical History  Diagnosis Date  . Diabetes mellitus without complication   . Hypertension   . Varicose veins   . Bowel habit changes   . Anemia   . Diabetes mellitus with renal manifestation 03/19/2009  . Vitamin D deficiency   . Hyperlipidemia 02/01/2010  . Hyponatremia   . Mitral valve disorder 04/01/2009  . CHF (congestive heart failure)   . Allergy 04/04/2009  . GERD (gastroesophageal reflux disease) 11/06/2009  . Hemorrhoids, external without complications 08/04/2009  . Arthralgia   . Myalgia   . Palpitations   . Chronic nausea   . Edema   . Atrial fibrillation 04/01/2009  . Dysuria      Medication List    ASK your doctor about these medications        aspirin 81 MG tablet  Take 81 mg by mouth daily.     atorvastatin 80 MG tablet  Commonly known as:  LIPITOR  Take 80 mg by mouth every evening.     benazepril 40 MG tablet  Commonly known as:  LOTENSIN  Take 1 tablet by mouth daily.     bisacodyl 10 MG suppository  Commonly known as:  DULCOLAX  Place 10 mg rectally daily as needed for mild constipation, moderate constipation or severe constipation.     carvedilol 25 MG tablet  Commonly known as:  COREG  Take 25 mg by mouth 2 (two) times daily.     cephALEXin 500 MG capsule  Commonly known as:  KEFLEX  Take 500 mg by mouth every 6 (six) hours. X 10 days for cellulitis     cloNIDine 0.3 MG tablet  Commonly known  as:  CATAPRES  Take 0.3 mg by mouth 3 (three) times daily.     diltiazem 120 MG tablet  Commonly known as:  CARDIZEM  Take 120 mg by mouth daily.     dorzolamide 2 % ophthalmic solution  Commonly known as:  TRUSOPT  Place 1 drop into both eyes 3 (three) times daily.     furosemide 20 MG tablet  Commonly known as:  LASIX  Take 20 mg by mouth 2 (two) times daily.     GLIPIZIDE XL 5 MG 24 hr tablet  Generic drug:  glipiZIDE  TAKE 1 TABLET DAILY     hydrALAZINE 100 MG tablet  Commonly known as:  APRESOLINE   Take 100 mg by mouth 3 (three) times daily.     hydrocortisone cream 1 %  Apply 1 application topically every 12 (twelve) hours as needed for itching (rash).     hyoscyamine 0.125 MG tablet  Commonly known as:  LEVSIN, ANASPAZ  Take 0.125 mg by mouth daily.     insulin glargine 100 UNIT/ML injection  Commonly known as:  LANTUS  Inject 50 Units into the skin at bedtime.     KLOR-CON M10 10 MEQ tablet  Generic drug:  potassium chloride  Take 10 mEq by mouth daily.     latanoprost 0.005 % ophthalmic solution  Commonly known as:  XALATAN  Place 1 drop into both eyes at bedtime.     metoCLOPramide 5 MG tablet  Commonly known as:  REGLAN  Take 5 mg by mouth 4 (four) times daily -  before meals and at bedtime.     pantoprazole 40 MG tablet  Commonly known as:  PROTONIX  Take 40 mg by mouth 2 (two) times daily.     polyethylene glycol powder powder  Commonly known as:  GLYCOLAX/MIRALAX  Take 17 g by mouth daily as needed for mild constipation, moderate constipation or severe constipation.     SENNA PO  Take 2 tablets by mouth daily.     sertraline 50 MG tablet  Commonly known as:  ZOLOFT  Take 50 mg by mouth daily.     simethicone 80 MG chewable tablet  Commonly known as:  MYLICON  Chew 80 mg by mouth every 6 (six) hours as needed for flatulence.     sitaGLIPtin 50 MG tablet  Commonly known as:  JANUVIA  Take 50 mg by mouth daily.     timolol 0.25 % ophthalmic solution  Commonly known as:  BETIMOL  Place 1 drop into both eyes 2 (two) times daily.        History   Social History  . Marital Status: Married    Spouse Name: N/A  . Number of Children: N/A  . Years of Education: N/A   Occupational History  . Not on file.   Social History Main Topics  . Smoking status: Never Smoker   . Smokeless tobacco: Not on file  . Alcohol Use: No  . Drug Use: Not on file  . Sexual Activity: No   Other Topics Concern  . Not on file   Social History Narrative    Family History  Problem Relation Age of Onset  . Heart attack Mother   . Cancer Father    Allergies  Allergen Reactions  . Benzocaine Other (See Comments)    Unknown reaction  . Contrast Media [Iodinated Diagnostic Agents] Other (See Comments)    Tachycardia, SVT  . Other Other (See Comments)    MRI  dye--reaction unknown.  Marland Kitchen Penicillin V Potassium Other (See Comments)  . Penicillins Other (See Comments)    Reaction: pt doesn't know, b/c it's been a long time ago.   . Sulfa Antibiotics Other (See Comments)    Reaction: pt doesn't know, b/c it's been a long time ago.    ROS: Full ROS obtained, pertinent positives and negatives as above  Blood pressure 160/56, pulse 59, temperature 96.7 F (35.9 C), temperature source Oral, resp. rate 22, height 5\' 3"  (1.6 m), weight 96.616 kg (213 lb), SpO2 97 %. GEN: NAD/A&Ox3 FACE: no obvious facial trauma, normal external nose, normal external ears EYES: no scleral icterus, no conjunctivitis HEAD: normocephalic atraumatic CV: RRR, no MRG RESP: moving air well, lungs clear ABD: soft, mintender, significantly distended EXT: moving all ext well, strength 5/5 NEURO: cnII-XII grossly intact, sensation intact all 4 ext  Labs: Personally reviewed, significant for  WBC 32.3 CO2 24 Cr 1.00  Imaging: personally reviewed KUB: significant colonic distention CT A/P: Mild colonic distention (4 cm), min pericolonic inflammation, min colonic thickening  A/P 81 yo F admit with C diff, now with significant distention and resolution of diarrhea.  Min tender, VSS, leukocytosis but CMP relatively unremarkable except for Na and Cl.  Concern for developing megacolon but i am underwhelmed by CT scan and exam.  If decompensates will require laparotomy and colectomy.  Otherwise I feel with CAD and stable VS and labs, nontender exam and unremarkable CT that this patient should be followed closely.  Agree with GI consult.  ? Role of vancomycin enemas

## 2015-06-26 DIAGNOSIS — I10 Essential (primary) hypertension: Secondary | ICD-10-CM

## 2015-06-26 DIAGNOSIS — E11649 Type 2 diabetes mellitus with hypoglycemia without coma: Secondary | ICD-10-CM

## 2015-06-26 DIAGNOSIS — R140 Abdominal distension (gaseous): Secondary | ICD-10-CM

## 2015-06-26 LAB — CBC
HCT: 25.1 % — ABNORMAL LOW (ref 35.0–47.0)
Hemoglobin: 8.4 g/dL — ABNORMAL LOW (ref 12.0–16.0)
MCH: 28.3 pg (ref 26.0–34.0)
MCHC: 33.6 g/dL (ref 32.0–36.0)
MCV: 84.5 fL (ref 80.0–100.0)
Platelets: 392 10*3/uL (ref 150–440)
RBC: 2.97 MIL/uL — AB (ref 3.80–5.20)
RDW: 14.8 % — ABNORMAL HIGH (ref 11.5–14.5)
WBC: 15.6 10*3/uL — AB (ref 3.6–11.0)

## 2015-06-26 LAB — COMPREHENSIVE METABOLIC PANEL
ALBUMIN: 2.2 g/dL — AB (ref 3.5–5.0)
ALT: 14 U/L (ref 14–54)
AST: 26 U/L (ref 15–41)
Alkaline Phosphatase: 71 U/L (ref 38–126)
Anion gap: 8 (ref 5–15)
BUN: 29 mg/dL — ABNORMAL HIGH (ref 6–20)
CO2: 23 mmol/L (ref 22–32)
CREATININE: 0.93 mg/dL (ref 0.44–1.00)
Calcium: 7.5 mg/dL — ABNORMAL LOW (ref 8.9–10.3)
Chloride: 86 mmol/L — ABNORMAL LOW (ref 101–111)
GFR calc Af Amer: >60 mL/min (ref >60)
GFR, EST NON AFRICAN AMERICAN: 56 mL/min — AB (ref >60)
Glucose, Bld: 162 mg/dL — ABNORMAL HIGH (ref 65–99)
Potassium: 4.3 mmol/L (ref 3.5–5.1)
SODIUM: 117 mmol/L — AB (ref 135–145)
TOTAL PROTEIN: 5.4 g/dL — AB (ref 6.5–8.1)
Total Bilirubin: 0.5 mg/dL (ref 0.3–1.2)

## 2015-06-26 LAB — GLUCOSE, CAPILLARY
GLUCOSE-CAPILLARY: 103 mg/dL — AB (ref 65–99)
GLUCOSE-CAPILLARY: 176 mg/dL — AB (ref 65–99)
GLUCOSE-CAPILLARY: 193 mg/dL — AB (ref 65–99)
Glucose-Capillary: 68 mg/dL (ref 65–99)
Glucose-Capillary: 129 mg/dL — ABNORMAL HIGH (ref 65–99)
Glucose-Capillary: 204 mg/dL — ABNORMAL HIGH (ref 65–99)

## 2015-06-26 LAB — SODIUM: SODIUM: 120 mmol/L — AB (ref 135–145)

## 2015-06-26 MED ORDER — TRAMADOL HCL 50 MG PO TABS
50.0000 mg | ORAL_TABLET | Freq: Four times a day (QID) | ORAL | Status: DC | PRN
  Administered 2015-06-26 – 2015-07-11 (×12): 50 mg via ORAL
  Filled 2015-06-26 (×11): qty 1

## 2015-06-26 MED ORDER — HEPARIN SODIUM (PORCINE) 5000 UNIT/ML IJ SOLN
5000.0000 [IU] | Freq: Three times a day (TID) | INTRAMUSCULAR | Status: DC
  Administered 2015-06-26 – 2015-06-30 (×12): 5000 [IU] via SUBCUTANEOUS
  Filled 2015-06-26 (×12): qty 1

## 2015-06-26 MED ORDER — CETYLPYRIDINIUM CHLORIDE 0.05 % MT LIQD
7.0000 mL | Freq: Two times a day (BID) | OROMUCOSAL | Status: DC
  Administered 2015-06-26 – 2015-07-13 (×30): 7 mL via OROMUCOSAL

## 2015-06-26 MED ORDER — CHLORHEXIDINE GLUCONATE 0.12 % MT SOLN: 15.0000 mL | Freq: Two times a day (BID) | OROMUCOSAL | Status: DC

## 2015-06-26 MED ORDER — BOOST / RESOURCE BREEZE PO LIQD
1.0000 | Freq: Three times a day (TID) | ORAL | Status: DC
  Administered 2015-06-26 – 2015-06-28 (×7): 1 via ORAL

## 2015-06-26 MED ORDER — DEXTROSE 50 % IV SOLN
1.0000 | Freq: Once | INTRAVENOUS | Status: AC
  Administered 2015-06-26: 50 mL via INTRAVENOUS
  Filled 2015-06-26: qty 50

## 2015-06-26 MED ORDER — FAMOTIDINE IN NACL 20-0.9 MG/50ML-% IV SOLN
20.0000 mg | INTRAVENOUS | Status: DC
  Administered 2015-06-26 – 2015-06-27 (×2): 20 mg via INTRAVENOUS
  Filled 2015-06-26 (×5): qty 50

## 2015-06-26 MED ORDER — LABETALOL HCL 5 MG/ML IV SOLN
10.0000 mg | INTRAVENOUS | Status: DC | PRN
  Administered 2015-06-27 (×2): 10 mg via INTRAVENOUS
  Administered 2015-06-27 (×2): 20 mg via INTRAVENOUS
  Administered 2015-06-27: 10 mg via INTRAVENOUS
  Filled 2015-06-26 (×5): qty 4

## 2015-06-26 MED ORDER — FUROSEMIDE 10 MG/ML IJ SOLN
20.0000 mg | Freq: Two times a day (BID) | INTRAMUSCULAR | Status: DC
  Administered 2015-06-26 – 2015-06-30 (×9): 20 mg via INTRAVENOUS
  Filled 2015-06-26 (×9): qty 2

## 2015-06-26 MED ORDER — TOLVAPTAN 15 MG PO TABS
15.0000 mg | ORAL_TABLET | ORAL | Status: DC
  Administered 2015-06-26: 15 mg via ORAL
  Filled 2015-06-26 (×2): qty 1

## 2015-06-26 MED ORDER — CETYLPYRIDINIUM CHLORIDE 0.05 % MT LIQD: 7.0000 mL | Freq: Two times a day (BID) | OROMUCOSAL | Status: DC

## 2015-06-26 NOTE — Consult Note (Signed)
[facility]  CONSULT NOTE    Date: 06/26/2015                  Patient Name:  Katherine [name]  MRN: [id number]  DOB: 1932/08/22  Age / Sex: 81 y.o., female         PCP: Mila Merry, MD                 Service Requesting Consult: Dr. Sherryll Burger                 Reason for Consult: Hyponatremia            History of Present Illness: Katherine [name] is a 82 y.o. [place] female with diabetes mellitus type II, hypertension, congestive heart failure, hyperlipidemia, coronary artery disease, atrial fibrillation, GERD, mitral valve disorder who was admitted to [facility] on 06/21/2015 for C. Diff colitis. Patient was found to have a serum sodium of 122. Baseline of 132 (05/03/2015).  Patient's serum sodium was thought to be due to hypovolemia from diarrhea and volume losses. However after IV fluids, patient has not had much improvement. NS infusion given from 7/24 to 7/25. Sodium did improve to 124. Patient had IV fluids stopped due to peripheral edema which she continues to have.  Furosemide was then given on 7/28. No new sodium for today. However patient was then given tolvaptan .  Urine studies are pending.    Medications: Outpatient medications: Prescriptions prior to admission  Medication Sig Dispense Refill Last Dose  . aspirin 81 MG tablet Take 81 mg by mouth daily.   unknown  . atorvastatin (LIPITOR) 80 MG tablet Take 80 mg by mouth every evening.   unknown  . benazepril (LOTENSIN) 40 MG tablet Take 1 tablet by mouth daily.    unknown  . bisacodyl (DULCOLAX) 10 MG suppository Place 10 mg rectally daily as needed for mild constipation, moderate constipation or severe constipation.   unknown  . carvedilol (COREG) 25 MG tablet Take 25 mg by mouth 2 (two) times daily.   unknown  . [EXPIRED] cephALEXin (KEFLEX) 500 MG capsule Take 500 mg by mouth every 6 (six) hours. X 10 days for cellulitis   unknown  . cloNIDine (CATAPRES) 0.3 MG tablet Take 0.3 mg by mouth 3  (three) times daily.    unknown  . diltiazem (CARDIZEM) 120 MG tablet Take 120 mg by mouth daily.   unknown  . dorzolamide (TRUSOPT) 2 % ophthalmic solution Place 1 drop into both eyes 3 (three) times daily.   unknown  . furosemide (LASIX) 20 MG tablet Take 20 mg by mouth 2 (two) times daily.    unknown  . hydrALAZINE (APRESOLINE) 100 MG tablet Take 100 mg by mouth 3 (three) times daily.   unknown  . hydrocortisone cream 1 % Apply 1 application topically every 12 (twelve) hours as needed for itching (rash).   unknown  . hyoscyamine (LEVSIN, ANASPAZ) 0.125 MG tablet Take 0.125 mg by mouth daily.    unknown  . insulin glargine (LANTUS) 100 UNIT/ML injection Inject 50 Units into the skin at bedtime.    unknown  . latanoprost (XALATAN) 0.005 % ophthalmic solution Place 1 drop into both eyes at bedtime.    unknown  . metoCLOPramide (REGLAN) 5 MG tablet Take 5 mg by mouth 4 (four) times daily -  before meals and at bedtime.   unknown  . pantoprazole (PROTONIX) 40 MG tablet Take 40 mg by mouth 2 (two) times daily.  unknown  . polyethylene glycol powder (GLYCOLAX/MIRALAX) powder Take 17 g by mouth daily as needed for mild constipation, moderate constipation or severe constipation.    unknown  . potassium chloride (KLOR-CON M10) 10 MEQ tablet Take 10 mEq by mouth daily.   unknown  . SENNA PO Take 2 tablets by mouth daily.   unknown  . sertraline (ZOLOFT) 50 MG tablet Take 50 mg by mouth daily.   unknown  . simethicone (MYLICON) 80 MG chewable tablet Chew 80 mg by mouth every 6 (six) hours as needed for flatulence.   unknown  . sitaGLIPtin (JANUVIA) 50 MG tablet Take 50 mg by mouth daily.   06/21/2015 at Unknown time  . timolol (BETIMOL) 0.25 % ophthalmic solution Place 1 drop into both eyes 2 (two) times daily.   unknown    Current medications: Current Facility-Administered Medications  Medication Dose Route Frequency Provider Last Rate Last Dose  . acidophilus (RISAQUAD) capsule 1 capsule  1 capsule  Oral BID Enedina Finner, MD   1 capsule at 06/25/15 2149  . albuterol (PROVENTIL) (2.5 MG/3ML) 0.083% nebulizer solution 2.5 mg  2.5 mg Nebulization Q2H PRN Altamese Dilling, MD   2.5 mg at 06/25/15 0212  . alum & mag hydroxide-simeth (MAALOX/MYLANTA) 200-200-20 MG/5ML suspension 15 mL  15 mL Oral Q4H PRN Enedina Finner, MD   15 mL at 06/25/15 2259  . antiseptic oral rinse (CPC / CETYLPYRIDINIUM CHLORIDE 0.05%) solution 7 mL  7 mL Mouth Rinse BID Vipul Shah, MD   7 mL at 06/26/15 1000  . aspirin EC tablet 81 mg  81 mg Oral QHS Gracelyn Nurse, MD   81 mg at 06/25/15 2150  . atorvastatin (LIPITOR) tablet 80 mg  80 mg Oral QPM Enedina Finner, MD   80 mg at 06/25/15 1750  . benazepril (LOTENSIN) tablet 40 mg  40 mg Oral BID Gracelyn Nurse, MD   40 mg at 06/26/15 1610  . carvedilol (COREG) tablet 25 mg  25 mg Oral BID Enedina Finner, MD   25 mg at 06/26/15 9604  . diltiazem (CARDIZEM CD) 24 hr capsule 120 mg  120 mg Oral Daily Enedina Finner, MD   120 mg at 06/25/15 0850  . diphenhydrAMINE (BENADRYL) capsule 25 mg  25 mg Oral Q4H PRN Wyatt Haste, MD   25 mg at 06/26/15 0333  . dorzolamide (TRUSOPT) 2 % ophthalmic solution 1 drop  1 drop Both Eyes TID Enedina Finner, MD   1 drop at 06/26/15 0920  . famotidine (PEPCID) IVPB 20 mg premix  20 mg Intravenous Q24H Shane Crutch, MD      . feeding supplement (BOOST / RESOURCE BREEZE) liquid 1 Container  1 Container Oral TID WC Delfino Lovett, MD      . heparin injection 5,000 Units  5,000 Units Subcutaneous 3 times per day Shane Crutch, MD   5,000 Units at 06/26/15 1301  . hydrALAZINE (APRESOLINE) tablet 100 mg  100 mg Oral TID Enedina Finner, MD   100 mg at 06/26/15 5409  . hydrocortisone cream 1 %   Topical QID Enedina Finner, MD      . imipenem-cilastatin (PRIMAXIN) 500 mg in sodium chloride 0.9 % 100 mL IVPB  500 mg Intravenous 3 times per day Delfino Lovett, MD   500 mg at 06/26/15 1302  . insulin aspart (novoLOG) injection 0-15 Units  0-15 Units Subcutaneous TID WC Gracelyn Nurse, MD   3 Units at 06/25/15 1210  . insulin aspart (novoLOG) injection  0-5 Units  0-5 Units Subcutaneous QHS Gracelyn Nurse, MD   2 Units at 06/24/15 2138  . ipratropium-albuterol (DUONEB) 0.5-2.5 (3) MG/3ML nebulizer solution 3 mL  3 mL Nebulization Q6H Shane Crutch, MD   3 mL at 06/26/15 1408  . labetalol (NORMODYNE,TRANDATE) injection 10-20 mg  10-20 mg Intravenous Q2H PRN Shane Crutch, MD      . latanoprost (XALATAN) 0.005 % ophthalmic solution 1 drop  1 drop Both Eyes QPM Gracelyn Nurse, MD   1 drop at 06/25/15 1801  . metroNIDAZOLE (FLAGYL) IVPB 500 mg  500 mg Intravenous Q8H Delfino Lovett, MD   500 mg at 06/26/15 0852  . multivitamin with minerals tablet 1 tablet  1 tablet Oral q morning - 10a Gracelyn Nurse, MD   1 tablet at 06/25/15 0850  . ondansetron (ZOFRAN) injection 4 mg  4 mg Intravenous Q4H PRN Enedina Finner, MD   4 mg at 06/26/15 1002  . potassium chloride (K-DUR,KLOR-CON) CR tablet 10 mEq  10 mEq Oral Daily Enedina Finner, MD   10 mEq at 06/26/15 0917  . sertraline (ZOLOFT) tablet 50 mg  50 mg Oral Daily Enedina Finner, MD   50 mg at 06/26/15 0917  . timolol (TIMOPTIC) 0.25 % ophthalmic solution 1 drop  1 drop Both Eyes BID Enedina Finner, MD   1 drop at 06/26/15 0920  . tolvaptan (SAMSCA) tablet 15 mg  15 mg Oral Q24H Delfino Lovett, MD   15 mg at 06/26/15 1302  . traMADol (ULTRAM) tablet 50 mg  50 mg Oral Q6H PRN Shane Crutch, MD   50 mg at 06/26/15 1100  . vancomycin (VANCOCIN) 50 mg/mL oral solution 250 mg  250 mg Oral 4 times per day Delfino Lovett, MD   250 mg at 06/26/15 1200      Allergies: Allergies  Allergen Reactions  . Benzocaine Other (See Comments)    Unknown reaction  . Contrast Media [Iodinated Diagnostic Agents] Other (See Comments)    Tachycardia, SVT  . Other Other (See Comments)    MRI dye--reaction unknown.  Marland Kitchen Penicillin V Potassium Other (See Comments)  . Penicillins Other (See Comments)    Reaction: pt doesn't know, b/c it's been a long  time ago.   . Sulfa Antibiotics Other (See Comments)    Reaction: pt doesn't know, b/c it's been a long time ago.       Past Medical History: Past Medical History  Diagnosis Date  . Diabetes mellitus without complication   . Hypertension   . CCF (congestive cardiac failure) 06/04/2015  . Varicose veins     of leg  . Bowel habit changes   . Anemia   . Diabetes mellitus with renal manifestation 03/19/2009  . Vitamin D deficiency   . Hyperglyceridemia, pure   . Hyperlipidemia 02/01/2010  . Hyponatremia   . Mitral valve disorder 04/01/2009  . Atherosclerosis of coronary artery 08/04/2009  . CHF (congestive heart failure)     EF 30-35% on echo 2014  . Allergy 04/04/2009  . GERD (gastroesophageal reflux disease) 11/06/2009  . Hemorrhoids, external without complications 08/04/2009  . Arthritis   . Arthralgia   . Myalgia   . Palpitations   . Chronic nausea   . Malaise and fatigue   . Edema   . Atrial fibrillation 04/01/2009  . Dysuria      Past Surgical History: Past Surgical History  Procedure Laterality Date  . Tonsillectomy    . Breast surgery    .  Abdominal hysterectomy    . Appendectomy    . Hernia repair    . Cholecystectomy    . Stents  2011     Family History: Family History  Problem Relation Age of Onset  . Heart attack Mother   . Cancer Father      Social History: History   Social History  . Marital Status: Married    Spouse Name: N/A  . Number of Children: N/A  . Years of Education: N/A   Occupational History  . Not on file.   Social History Main Topics  . Smoking status: Never Smoker   . Smokeless tobacco: Not on file  . Alcohol Use: No  . Drug Use: Not on file  . Sexual Activity: No   Other Topics Concern  . Not on file   Social History Narrative     Review of Systems: Review of Systems  Constitutional: Positive for weight loss and malaise/fatigue. Negative for fever, chills and diaphoresis.  HENT: Negative.  Negative for  congestion, ear discharge, ear pain, hearing loss, nosebleeds, sore throat and tinnitus.   Eyes: Negative.  Negative for blurred vision, double vision, photophobia, pain, discharge and redness.  Respiratory: Negative.  Negative for cough, hemoptysis, sputum production, shortness of breath, wheezing and stridor.   Cardiovascular: Positive for leg swelling. Negative for chest pain, palpitations, orthopnea, claudication and PND.  Gastrointestinal: Positive for heartburn, nausea, abdominal pain and diarrhea. Negative for vomiting, constipation, blood in stool and melena.  Genitourinary: Negative.  Negative for dysuria, urgency, frequency, hematuria and flank pain.  Musculoskeletal: Negative.  Negative for myalgias, back pain, joint pain, falls and neck pain.  Skin: Negative for itching and rash.  Neurological: Positive for weakness. Negative for dizziness, tingling, tremors, sensory change, speech change, focal weakness, seizures, loss of consciousness and headaches.  Endo/Heme/Allergies: Negative for environmental allergies and polydipsia. Does not bruise/bleed easily.  Psychiatric/Behavioral: Negative for depression, suicidal ideas, hallucinations, memory loss and substance abuse. The patient is nervous/anxious. The patient does not have insomnia.     Vital Signs: Blood pressure 173/74, pulse 56, temperature 98 F (36.7 C), temperature source Oral, resp. rate 16, height 5\' 3"  (1.6 m), weight 96.616 kg (213 lb), SpO2 99 %.  Weight trends: Filed Weights   06/21/15 1508  Weight: 96.616 kg (213 lb)    Physical Exam: General: Critically ill  Head: Normocephalic, atraumatic. Moist oral mucosal membranes  Eyes: Anicteric, PERRL  Neck: Supple, trachea midline  Lungs:  Clear to auscultation  Heart: irregular  Abdomen:  +distended, +tympanic to percussion  Extremities: 3+ peripheral edema.  Neurologic: Nonfocal, moving all four extremities  Skin: No lesions        Lab results: Basic  Metabolic Panel:  Recent Labs Lab 06/21/15 1538 06/22/15 0408 06/23/15 0528 06/25/15 0510  NA 122* 124* 122* 121*  K 3.6 3.5  --  4.4  CL 86* 90*  --  90*  CO2 24 23  --  24  GLUCOSE 225* 122*  --  175*  BUN 32* 29*  --  30*  CREATININE 1.29* 1.16* 0.97 1.00  CALCIUM 8.3* 8.1*  --  8.0*    Liver Function Tests:  Recent Labs Lab 06/21/15 1538  AST 16  ALT 12*  ALKPHOS 89  BILITOT 0.4  PROT 5.3*  ALBUMIN 2.5*   No results for input(s): LIPASE, AMYLASE in the last 168 hours. No results for input(s): AMMONIA in the last 168 hours.  CBC:  Recent Labs Lab 06/21/15 1538  06/22/15 0408 06/23/15 0528 06/25/15 0510 06/26/15 0931  WBC 22.5* 27.1* 33.9* 32.3* 15.6*  NEUTROABS 19.5*  --   --   --   --   HGB 8.1* 7.7* 8.9* 8.4* 8.4*  HCT 25.0* 23.4* 27.0* 26.2* 25.1*  MCV 85.7 85.4 85.0 85.3 84.5  PLT 393 397 452* 474* 392    Cardiac Enzymes:  Recent Labs Lab 06/25/15 1316 06/25/15 1935  TROPONINI 0.04* 0.04*    BNP: Invalid input(s): POCBNP  CBG:  Recent Labs Lab 06/25/15 1758 06/25/15 2118 06/26/15 0733 06/26/15 1002 06/26/15 1139  GLUCAP 104* 97 68 129* 103*    Microbiology: Results for orders placed or performed during the hospital encounter of 06/21/15  Stool culture     Status: None   Collection Time: 06/21/15  5:17 PM  Result Value Ref Range Status   Specimen Description STOOL  Final   Special Requests Normal  Final   Culture   Final    NO SALMONELLA OR SHIGELLA ISOLATED No Pathogenic E. coli detected NO CAMPYLOBACTER DETECTED    Report Status 06/23/2015 FINAL  Final  C difficile quick scan w PCR reflex ([facility] only)     Status: Abnormal   Collection Time: 06/21/15  5:17 PM  Result Value Ref Range Status   C Diff antigen POSITIVE (A) NEGATIVE Final   C Diff toxin POSITIVE (A) NEGATIVE Final   C Diff interpretation   Final    Positive for toxigenic C. difficile, active toxin production present.    Comment: CRITICAL RESULT CALLED TO,  READ BACK BY AND VERIFIED WITH: DONALD SWEENEY ON 06/21/15 AT 1755 BY JEF   MRSA PCR Screening     Status: None   Collection Time: 06/25/15 10:48 AM  Result Value Ref Range Status   MRSA by PCR NEGATIVE NEGATIVE Final    Comment:        The GeneXpert MRSA Assay (FDA approved for NASAL specimens only), is one component of a comprehensive MRSA colonization surveillance program. It is not intended to diagnose MRSA infection nor to guide or monitor treatment for MRSA infections.     Coagulation Studies: No results for input(s): LABPROT, INR in the last 72 hours.  Urinalysis: No results for input(s): COLORURINE, LABSPEC, PHURINE, GLUCOSEU, HGBUR, BILIRUBINUR, KETONESUR, PROTEINUR, UROBILINOGEN, NITRITE, LEUKOCYTESUR in the last 72 hours.  Invalid input(s): APPERANCEUR    Imaging: Ct Abdomen Pelvis Wo Contrast  06/25/2015   CLINICAL DATA:  Recently on IV antibiotics for high infection. Began to have diarrhea with fall today due to weakness. Positive stool culture for C difficile. Previous hysterectomy, appendectomy and cholecystectomy.  EXAM: CT CHEST, ABDOMEN AND PELVIS WITHOUT CONTRAST  TECHNIQUE: Multidetector CT imaging of the chest, abdomen and pelvis was performed following the standard protocol without IV contrast.  COMPARISON:  CT abdomen/ pelvis 10/28/2009  FINDINGS: CT CHEST FINDINGS  Lungs are adequately inflated demonstrate moderate size bilateral pleural effusions with associated posterior bibasilar consolidation likely atelectasis. Airways are within normal.  There is no significant hilar or axillary adenopathy. There is a 1 cm right peritracheal lymph node likely reactive. There is mild calcified plaque over the thoracic aorta. Findings suggesting a stent over the left anterior descending coronary artery. There is mild cardiomegaly. Remaining mediastinal structures are unremarkable.  CT ABDOMEN AND PELVIS FINDINGS  Abdominal images demonstrate evidence of previous  cholecystectomy. The liver, spleen, pancreas and adrenal glands are within normal. Kidneys demonstrate no evidence of hydronephrosis or nephrolithiasis. Ureters are within normal. Surgical absence of the  appendix.  There is mild calcified plaque over the abdominal aorta and iliac arteries.  There is mild ascites present. There is mild to moderate subcutaneous edema over the flanks and abdominal wall.  Mild diverticulosis of the colon. Mild wall thickening involving the rectosigmoid colon with minimal stranding of the adjacent pericolonic fat as findings may be due to acute colitis. No evidence of perforation.  Remaining pelvic images demonstrate surgical absence of the uterus. The bladder is within normal.  There is mild spondylosis of the spine with moderate disc disease the L2-3 and L3-4 levels.  IMPRESSION: Mild wall thickening involving the rectosigmoid colon which may be due to an acute colitis. No evidence of perforation.  Mild ascites. Subcutaneous edema over the abdominal wall/flanks. Moderate size bilateral pleural effusions with associated compressive atelectasis in the lung bases.  Mild cardiomegaly.  Coronary stent.   Electronically Signed   By: Elberta Fortis M.D.   On: 06/25/2015 13:53   Ct Chest Wo Contrast  06/25/2015   CLINICAL DATA:  Recently on IV antibiotics for high infection. Began to have diarrhea with fall today due to weakness. Positive stool culture for C difficile. Previous hysterectomy, appendectomy and cholecystectomy.  EXAM: CT CHEST, ABDOMEN AND PELVIS WITHOUT CONTRAST  TECHNIQUE: Multidetector CT imaging of the chest, abdomen and pelvis was performed following the standard protocol without IV contrast.  COMPARISON:  CT abdomen/ pelvis 10/28/2009  FINDINGS: CT CHEST FINDINGS  Lungs are adequately inflated demonstrate moderate size bilateral pleural effusions with associated posterior bibasilar consolidation likely atelectasis. Airways are within normal.  There is no significant hilar  or axillary adenopathy. There is a 1 cm right peritracheal lymph node likely reactive. There is mild calcified plaque over the thoracic aorta. Findings suggesting a stent over the left anterior descending coronary artery. There is mild cardiomegaly. Remaining mediastinal structures are unremarkable.  CT ABDOMEN AND PELVIS FINDINGS  Abdominal images demonstrate evidence of previous cholecystectomy. The liver, spleen, pancreas and adrenal glands are within normal. Kidneys demonstrate no evidence of hydronephrosis or nephrolithiasis. Ureters are within normal. Surgical absence of the appendix.  There is mild calcified plaque over the abdominal aorta and iliac arteries.  There is mild ascites present. There is mild to moderate subcutaneous edema over the flanks and abdominal wall.  Mild diverticulosis of the colon. Mild wall thickening involving the rectosigmoid colon with minimal stranding of the adjacent pericolonic fat as findings may be due to acute colitis. No evidence of perforation.  Remaining pelvic images demonstrate surgical absence of the uterus. The bladder is within normal.  There is mild spondylosis of the spine with moderate disc disease the L2-3 and L3-4 levels.  IMPRESSION: Mild wall thickening involving the rectosigmoid colon which may be due to an acute colitis. No evidence of perforation.  Mild ascites. Subcutaneous edema over the abdominal wall/flanks. Moderate size bilateral pleural effusions with associated compressive atelectasis in the lung bases.  Mild cardiomegaly.  Coronary stent.   Electronically Signed   By: Elberta Fortis M.D.   On: 06/25/2015 13:53   Dg Abd 2 Views  06/24/2015   CLINICAL DATA:  Gaseous abdominal distention.  EXAM: ABDOMEN - 2 VIEW  COMPARISON:  06/22/2015  FINDINGS: Diffuse gaseous distention of the colon is demonstrated, suspicious for ileus. No definite evidence of small bowel obstruction. Right upper quadrant surgical clips are noted. No evidence of free air. Opacity  noted in left retrocardiac lung base.  IMPRESSION: Diffuse gaseous distention of colon, suspicious for colonic ileus.  Opacity in  left lung base. Consider chest radiograph for further evaluation.   Electronically Signed   By: Myles Rosenthal M.D.   On: 06/24/2015 18:24      Assessment & Plan: Katherine [name] is a 81 y.o. [place] female with diabetes mellitus type II, hypertension, congestive heart failure, hyperlipidemia, coronary artery disease, atrial fibrillation, GERD, mitral valve disorder who was admitted to [facility] on 06/21/2015 for C. Diff colitis. Patient was found to have a serum sodium of 122. Baseline of 132 (05/03/2015).   1. Acute hyponatremia on chronic hyponatremia: hypervolemic on examination. Hypo-osmotic with measured osm of 257. Urine osm pending. New sodium level for today pending. Status post tolvaptan given today.  Seems that acute hyponatremia is hypervolemic while chronic hyponatremia could be due to congestive heart failure - Await labs. May restart furosemide afterwards. Do not recommend to continue tolvaptan.   2. Acute kidney injury on chronic kidney disease stage III: creatinine now back to baseline however may be dilutional from volume overload. Acute renal failure secondary to prerenal azotemia. Chronic kidney disease seems to be age related.   3. Hypertension: elevated on examination. Most likely volume driven.  - home regimen of benazepril, carvedilol, diltiazem, clonidine, furosemide, hydralazine  LOS: 5 Winner Valeriano 7/29/20162:44 PM

## 2015-06-26 NOTE — Assessment & Plan Note (Signed)
-  Secondary to toxic: From severe C. difficile colitis. -Patient appears to be doing slightly better today we will continue to monitor her status.

## 2015-06-26 NOTE — Care Management Important Message (Signed)
Important Message  Patient Details  Name: Katherine [name] MRN: [id number] Date of Birth: 09-12-1932   Medicare Important Message Given:  Yes-third notification given    Eber Hong, RN 06/26/2015, 3:56 PM

## 2015-06-26 NOTE — Progress Notes (Signed)
Patient: Katherine [name] / Admit Date: 06/21/2015 / Date of Encounter: 06/26/2015, 7:51 AM   Subjective: SOB overnight. Sore all over from diffuse swelling/edema. Echo in 04/2015 with normal EF and diastolic dysfunction. No angina.   Review of Systems: Review of Systems  Constitutional: Positive for malaise/fatigue. Negative for fever, chills, weight loss and diaphoresis.  HENT: Negative for congestion.   Eyes: Negative for blurred vision, discharge and redness.  Respiratory: Positive for shortness of breath. Negative for cough, hemoptysis, sputum production and wheezing.   Cardiovascular: Positive for leg swelling. Negative for chest pain, palpitations, orthopnea, claudication and PND.  Gastrointestinal: Positive for nausea and diarrhea. Negative for vomiting, constipation, blood in stool and melena.  Musculoskeletal: Negative for myalgias and falls.  Skin: Negative for rash.  Neurological: Positive for weakness. Negative for sensory change, speech change and focal weakness.  Endo/Heme/Allergies: Does not bruise/bleed easily.  Psychiatric/Behavioral: The patient is not nervous/anxious.      Objective: Telemetry: NSR, occasional PACs and PVCs Physical Exam: Blood pressure 182/73, pulse 73, temperature 97.8 F (36.6 C), temperature source Oral, resp. rate 19, height  (1.6 m), weight 213 lb (96.616 kg), SpO2 95 %. Body mass index is 37.74 kg/(m^2). General: Well developed, well nourished, in no acute distress. Head: Normocephalic, atraumatic, sclera non-icteric, no xanthomas, nares are without discharge. Neck: Negative for carotid bruits. JVP not elevated. Lungs: Clear bilaterally to auscultation without wheezes, rales, or rhonchi. Breathing is unlabored. Heart: RRR, S1 S2 without murmurs, rubs, or gallops.  Abdomen: Soft, non-tender, non-distended with normoactive bowel sounds. No rebound/guarding. Extremities: No clubbing or cyanosis. Diffuse 1-2+ pitting edema. Neuro: Alert  and oriented X 3. Moves all extremities spontaneously. Psych:  Responds to questions appropriately with a normal affect.   Intake/Output Summary (Last 24 hours) at 06/26/15 0751 Last data filed at 06/26/15 0000  Gross per 24 hour  Intake      0 ml  Output    800 ml  Net   -800 ml    Inpatient Medications:  . acidophilus  1 capsule Oral BID  . antiseptic oral rinse  7 mL Mouth Rinse BID  . aspirin EC  81 mg Oral QHS  . atorvastatin  80 mg Oral QPM  . benazepril  40 mg Oral BID  . carvedilol  25 mg Oral BID  . diltiazem  120 mg Oral Daily  . dorzolamide  1 drop Both Eyes TID  . feeding supplement (GLUCERNA SHAKE)  237 mL Oral TID WC  . furosemide  20 mg Intravenous Daily  . glipiZIDE  5 mg Oral Q breakfast  . hydrALAZINE  100 mg Oral TID  . hydrocortisone cream   Topical QID  . imipenem-cilastatin  500 mg Intravenous 3 times per day  . insulin aspart  0-15 Units Subcutaneous TID WC  . insulin aspart  0-5 Units Subcutaneous QHS  . insulin glargine  46 Units Subcutaneous QPM  . ipratropium-albuterol  3 mL Nebulization Q6H  . latanoprost  1 drop Both Eyes QPM  . linagliptin  5 mg Oral Daily  . metronidazole  500 mg Intravenous Q8H  . multivitamin with minerals  1 tablet Oral q morning - 10a  . pantoprazole  40 mg Oral BID  . potassium chloride  10 mEq Oral Daily  . sertraline  50 mg Oral Daily  . timolol  1 drop Both Eyes BID  . vancomycin  250 mg Oral 4 times per day  . vancomycin (VANCOCIN) rectal ENEMA  500 mg Rectal 4 times per day   Infusions:    Labs:  Recent Labs  06/25/15 0510  NA 121*  K 4.4  CL 90*  CO2 24  GLUCOSE 175*  BUN 30*  CREATININE 1.00  CALCIUM 8.0*   No results for input(s): AST, ALT, ALKPHOS, BILITOT, PROT, ALBUMIN in the last 72 hours.  Recent Labs  06/25/15 0510  WBC 32.3*  HGB 8.4*  HCT 26.2*  MCV 85.3  PLT 474*    Recent Labs  06/25/15 1316 06/25/15 1935  TROPONINI 0.04* 0.04*   Invalid input(s): POCBNP No results for  input(s): HGBA1C in the last 72 hours.   Weights: Filed Weights   06/21/15 1508  Weight: 213 lb (96.616 kg)     Radiology/Studies:  Ct Abdomen Pelvis Wo Contrast  06/25/2015   CLINICAL DATA:  Recently on IV antibiotics for high infection. Began to have diarrhea with fall today due to weakness. Positive stool culture for C difficile. Previous hysterectomy, appendectomy and cholecystectomy.  EXAM: CT CHEST, ABDOMEN AND PELVIS WITHOUT CONTRAST  TECHNIQUE: Multidetector CT imaging of the chest, abdomen and pelvis was performed following the standard protocol without IV contrast.  COMPARISON:  CT abdomen/ pelvis 10/28/2009  FINDINGS: CT CHEST FINDINGS  Lungs are adequately inflated demonstrate moderate size bilateral pleural effusions with associated posterior bibasilar consolidation likely atelectasis. Airways are within normal.  There is no significant hilar or axillary adenopathy. There is a 1 cm right peritracheal lymph node likely reactive. There is mild calcified plaque over the thoracic aorta. Findings suggesting a stent over the left anterior descending coronary artery. There is mild cardiomegaly. Remaining mediastinal structures are unremarkable.  CT ABDOMEN AND PELVIS FINDINGS  Abdominal images demonstrate evidence of previous cholecystectomy. The liver, spleen, pancreas and adrenal glands are within normal. Kidneys demonstrate no evidence of hydronephrosis or nephrolithiasis. Ureters are within normal. Surgical absence of the appendix.  There is mild calcified plaque over the abdominal aorta and iliac arteries.  There is mild ascites present. There is mild to moderate subcutaneous edema over the flanks and abdominal wall.  Mild diverticulosis of the colon. Mild wall thickening involving the rectosigmoid colon with minimal stranding of the adjacent pericolonic fat as findings may be due to acute colitis. No evidence of perforation.  Remaining pelvic images demonstrate surgical absence of the uterus.  The bladder is within normal.  There is mild spondylosis of the spine with moderate disc disease the L2-3 and L3-4 levels.  IMPRESSION: Mild wall thickening involving the rectosigmoid colon which may be due to an acute colitis. No evidence of perforation.  Mild ascites. Subcutaneous edema over the abdominal wall/flanks. Moderate size bilateral pleural effusions with associated compressive atelectasis in the lung bases.  Mild cardiomegaly.  Coronary stent.   Electronically Signed   By: Elberta Fortis M.D.   On: 06/25/2015 13:53   Dg Elbow Complete Left  06/21/2015   CLINICAL DATA:  Patient fell at home with resulting left elbow pain  EXAM: LEFT ELBOW - COMPLETE 3+ VIEW  COMPARISON:  None.  FINDINGS: There is no dislocation. Fracture is not definitively identified. There is mild elevation of the anterior fat pad however.  IMPRESSION: There may be a small elbow joint effusion which implies in the setting of trauma the possibility of a nondisplaced radiographically occult fracture. Correlate with degree of suspicion on clinical examination and consider further imaging with CT scan if indicated. Alternatively if symptoms persist centered radiographic followup in about 4-7 days.   Electronically  Signed   By: Esperanza Heir M.D.   On: 06/21/2015 16:12   Dg Knee 2 Views Left  06/21/2015   CLINICAL DATA:  Fall, left knee pain  EXAM: LEFT KNEE - 1-2 VIEW  COMPARISON:  None.  FINDINGS: No acute fracture or dislocation. No lytic or sclerotic osseous lesion. No joint effusion. 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 Peripheral vascular atherosclerotic disease.  IMPRESSION: No acute osseous injury of the left knee.   Electronically Signed   By: Elige Ko   On: 06/21/2015 16:04   Dg Abd 1 View  06/22/2015   CLINICAL DATA:  Abdominal pain and distention.  EXAM: ABDOMEN - 1 VIEW  COMPARISON:  09/18/2009  FINDINGS: The abdominal gas pattern is negative for obstruction or perforation. There is a  generous volume of stool throughout the colon. There are surgical clips in the right upper quadrant consistent with cholecystectomy. No biliary or urinary calculi are evident.  IMPRESSION: Negative for obstruction or perforation.   Electronically Signed   By: Ellery Plunk M.D.   On: 06/22/2015 17:09   Ct Chest Wo Contrast  06/25/2015   CLINICAL DATA:  Recently on IV antibiotics for high infection. Began to have diarrhea with fall today due to weakness. Positive stool culture for C difficile. Previous hysterectomy, appendectomy and cholecystectomy.  EXAM: CT CHEST, ABDOMEN AND PELVIS WITHOUT CONTRAST  TECHNIQUE: Multidetector CT imaging of the chest, abdomen and pelvis was performed following the standard protocol without IV contrast.  COMPARISON:  CT abdomen/ pelvis 10/28/2009  FINDINGS: CT CHEST FINDINGS  Lungs are adequately inflated demonstrate moderate size bilateral pleural effusions with associated posterior bibasilar consolidation likely atelectasis. Airways are within normal.  There is no significant hilar or axillary adenopathy. There is a 1 cm right peritracheal lymph node likely reactive. There is mild calcified plaque over the thoracic aorta. Findings suggesting a stent over the left anterior descending coronary artery. There is mild cardiomegaly. Remaining mediastinal structures are unremarkable.  CT ABDOMEN AND PELVIS FINDINGS  Abdominal images demonstrate evidence of previous cholecystectomy. The liver, spleen, pancreas and adrenal glands are within normal. Kidneys demonstrate no evidence of hydronephrosis or nephrolithiasis. Ureters are within normal. Surgical absence of the appendix.  There is mild calcified plaque over the abdominal aorta and iliac arteries.  There is mild ascites present. There is mild to moderate subcutaneous edema over the flanks and abdominal wall.  Mild diverticulosis of the colon. Mild wall thickening involving the rectosigmoid colon with minimal stranding of the  adjacent pericolonic fat as findings may be due to acute colitis. No evidence of perforation.  Remaining pelvic images demonstrate surgical absence of the uterus. The bladder is within normal.  There is mild spondylosis of the spine with moderate disc disease the L2-3 and L3-4 levels.  IMPRESSION: Mild wall thickening involving the rectosigmoid colon which may be due to an acute colitis. No evidence of perforation.  Mild ascites. Subcutaneous edema over the abdominal wall/flanks. Moderate size bilateral pleural effusions with associated compressive atelectasis in the lung bases.  Mild cardiomegaly.  Coronary stent.   Electronically Signed   By: Elberta Fortis M.D.   On: 06/25/2015 13:53   Dg Abd 2 Views  06/24/2015   CLINICAL DATA:  Gaseous abdominal distention.  EXAM: ABDOMEN - 2 VIEW  COMPARISON:  06/22/2015  FINDINGS: Diffuse gaseous distention of the colon is demonstrated, suspicious for ileus. No definite evidence of small bowel obstruction. Right upper quadrant surgical clips are noted. No evidence of free  air. Opacity noted in left retrocardiac lung base.  IMPRESSION: Diffuse gaseous distention of colon, suspicious for colonic ileus.  Opacity in left lung base. Consider chest radiograph for further evaluation.   Electronically Signed   By: Myles Rosenthal M.D.   On: 06/24/2015 18:24   Dg Hip Unilat With Pelvis 2-3 Views Left  06/21/2015   CLINICAL DATA:  Fall  EXAM: DG HIP (WITH OR WITHOUT PELVIS) 2-3V LEFT  COMPARISON:  None.  FINDINGS: There is no evidence of hip fracture or dislocation. There is generalized osteopenia. There is no evidence of arthropathy or other focal bone abnormality. There is peripheral vascular atherosclerotic disease.  IMPRESSION: No acute osseous injury of the left hip.   Electronically Signed   By: Elige Ko   On: 06/21/2015 16:04     Assessment and Plan  81 yr old W F with multiple medical problmes got out of rehab after having treated with IV abxs for Orbital cellulitis.  Presented after a fall at home, ongoing diarrhea, and malnutrition.   1. Acute collitis:  -Abd x-ray showed ileus. CT abdomen with acute colitis. Dr Markham Jordan and Dr Juliann Pulse following -Currently on IV flagyl, PO vanco and imipenem per Dr Markham Jordan, IVIG   2. EKG changes:  -Review of the EKG shows chronic right bundle branch block, no significant changes -Troponin 0.04 x 2 -No symptoms of angina at this time -We will monitor  3. Hyponatremia:  -Secondary to fluid loss.  -Would agree with gentle IV fluids  4. Leukocytosis:  -Sec to c.dif -No CBC 7/29  5.Shortness of breath -This is a chronic issue dating back more than one year -Likely secondary to grossly distended abdomen and pressure -She feels better in a slightly inclined position. Patient repositioned. She appears relatively comfortable  6. Anasarca -Secondary to profound anemia, low albumen, 2.5, third spacing -Pitting edema of arms and legs -If unable to gain adequate by mouth intake, may need central line for TPN, discussed with hospitalist and RN  7. CAD -Prior stent to her LAD -Stress test at [facility] June 2016 with regions of ischemia -High risk of attenuation artifact given her size -No clinical indication of active ischemia -Medical management at this time -No need to repeat echocardiogram, had normal EF last month  8. Question of atrial fibrillation: -Unclear if she is having paroxysmal atrial fibrillation -Prior EKGs in our system show EKGs labeled as atrial fibrillation when they are in fact normal sinus rhythm with ectopy -We'll continue to monitor -Currently not on anticoagulation given prior GI bleeding, significant anemia   Signed, Eula Listen, PA-C Pager: [phone] 06/26/2015, 7:51 AM

## 2015-06-26 NOTE — Progress Notes (Signed)
Patient wanted RN to throw away her clothes in the cupboard and was insistent that RN do so. Stating that her family would not want the clothes with germs on them in the house. RN placed in trash bag and left in room for dayshift RN to discuss with family.

## 2015-06-26 NOTE — Progress Notes (Signed)
Dr. Wynelle Link called RN about patient's serum sodium value not showing in the computer. RN verified that it had not been drawn yet but lab confirmed that a phlebotomist was assigned to it. RN to call MD back when results in.

## 2015-06-26 NOTE — Progress Notes (Addendum)
[facility] - [place] at [facility]   PATIENT NAME: Katherine [name]    MR#:  [id number]  DATE OF BIRTH:  03-22-1932  SUBJECTIVE:  Feels extremely weak and tired. abd less distended  REVIEW OF SYSTEMS:   Review of Systems  Constitutional: Negative for fever, chills and weight loss.  HENT: Negative for ear discharge, ear pain and nosebleeds.   Eyes: Negative for blurred vision, pain and discharge.  Respiratory: Positive for shortness of breath. Negative for sputum production, wheezing and stridor.   Cardiovascular: Positive for leg swelling. Negative for chest pain, palpitations, orthopnea and PND.  Gastrointestinal: Positive for nausea, abdominal pain and diarrhea. Negative for vomiting.  Genitourinary: Negative for urgency and frequency.  Musculoskeletal: Negative for back pain and joint pain.  Neurological: Positive for weakness. Negative for sensory change, speech change and focal weakness.  Psychiatric/Behavioral: Negative for depression. The patient is not nervous/anxious.   All other systems reviewed and are negative.  Tolerating Diet: not able to eat much, very nauseous and distended abdomen Tolerating PT: eval pending DRUG ALLERGIES:   Allergies  Allergen Reactions  . Benzocaine Other (See Comments)    Unknown reaction  . Contrast Media [Iodinated Diagnostic Agents] Other (See Comments)    Tachycardia, SVT  . Other Other (See Comments)    MRI dye--reaction unknown.  Marland Kitchen Penicillin V Potassium Other (See Comments)  . Penicillins Other (See Comments)    Reaction: pt doesn't know, b/c it's been a long time ago.   . Sulfa Antibiotics Other (See Comments)    Reaction: pt doesn't know, b/c it's been a long time ago.    VITALS:  Blood pressure 174/66, pulse 67, temperature 98 F (36.7 C), temperature source Oral, resp. rate 18, height  (1.6 m), weight 96.616 kg (213 lb), SpO2 98 %. PHYSICAL EXAMINATION:  Physical Exam  Constitutional: She is  oriented to person, place, and time and well-developed, well-nourished, and in no distress.  HENT:  Head: Normocephalic and atraumatic.  Eyes: Conjunctivae and EOM are normal. Pupils are equal, round, and reactive to light.  Neck: Normal range of motion. Neck supple. No tracheal deviation present. No thyromegaly present.  Cardiovascular: Normal rate, regular rhythm and normal heart sounds.   Pulmonary/Chest: Effort normal and breath sounds normal. No respiratory distress. She has no wheezes. She exhibits no tenderness.  Abdominal: Soft. Bowel sounds are normal. She exhibits distension (gaseous). There is tenderness.  Musculoskeletal: Normal range of motion.  Neurological: She is alert and oriented to person, place, and time. No cranial nerve deficit.  Skin: Skin is warm and dry. No rash noted.  Psychiatric: Mood and affect normal.   LABORATORY PANEL:   CBC  Recent Labs Lab 06/26/15 0931  WBC 15.6*  HGB 8.4*  HCT 25.1*  PLT 392    Chemistries   Recent Labs Lab 06/21/15 1538  06/25/15 0510  NA 122*  < > 121*  K 3.6  < > 4.4  CL 86*  < > 90*  CO2 24  < > 24  GLUCOSE 225*  < > 175*  BUN 32*  < > 30*  CREATININE 1.29*  < > 1.00  CALCIUM 8.3*  < > 8.0*  AST 16  --   --   ALT 12*  --   --   ALKPHOS 89  --   --   BILITOT 0.4  --   --   < > = values in this interval not displayed.  Cardiac Enzymes  Recent Labs Lab 06/25/15 1935  TROPONINI 0.04*   RADIOLOGY:  Ct Abdomen Pelvis Wo Contrast  06/25/2015   CLINICAL DATA:  Recently on IV antibiotics for high infection. Began to have diarrhea with fall today due to weakness. Positive stool culture for C difficile. Previous hysterectomy, appendectomy and cholecystectomy.  EXAM: CT CHEST, ABDOMEN AND PELVIS WITHOUT CONTRAST  TECHNIQUE: Multidetector CT imaging of the chest, abdomen and pelvis was performed following the standard protocol without IV contrast.  COMPARISON:  CT abdomen/ pelvis 10/28/2009  FINDINGS: CT CHEST FINDINGS   Lungs are adequately inflated demonstrate moderate size bilateral pleural effusions with associated posterior bibasilar consolidation likely atelectasis. Airways are within normal.  There is no significant hilar or axillary adenopathy. There is a 1 cm right peritracheal lymph node likely reactive. There is mild calcified plaque over the thoracic aorta. Findings suggesting a stent over the left anterior descending coronary artery. There is mild cardiomegaly. Remaining mediastinal structures are unremarkable.  CT ABDOMEN AND PELVIS FINDINGS  Abdominal images demonstrate evidence of previous cholecystectomy. The liver, spleen, pancreas and adrenal glands are within normal. Kidneys demonstrate no evidence of hydronephrosis or nephrolithiasis. Ureters are within normal. Surgical absence of the appendix.  There is mild calcified plaque over the abdominal aorta and iliac arteries.  There is mild ascites present. There is mild to moderate subcutaneous edema over the flanks and abdominal wall.  Mild diverticulosis of the colon. Mild wall thickening involving the rectosigmoid colon with minimal stranding of the adjacent pericolonic fat as findings may be due to acute colitis. No evidence of perforation.  Remaining pelvic images demonstrate surgical absence of the uterus. The bladder is within normal.  There is mild spondylosis of the spine with moderate disc disease the L2-3 and L3-4 levels.  IMPRESSION: Mild wall thickening involving the rectosigmoid colon which may be due to an acute colitis. No evidence of perforation.  Mild ascites. Subcutaneous edema over the abdominal wall/flanks. Moderate size bilateral pleural effusions with associated compressive atelectasis in the lung bases.  Mild cardiomegaly.  Coronary stent.   Electronically Signed   By: Elberta Fortis M.D.   On: 06/25/2015 13:53   Ct Chest Wo Contrast  06/25/2015   CLINICAL DATA:  Recently on IV antibiotics for high infection. Began to have diarrhea with  fall today due to weakness. Positive stool culture for C difficile. Previous hysterectomy, appendectomy and cholecystectomy.  EXAM: CT CHEST, ABDOMEN AND PELVIS WITHOUT CONTRAST  TECHNIQUE: Multidetector CT imaging of the chest, abdomen and pelvis was performed following the standard protocol without IV contrast.  COMPARISON:  CT abdomen/ pelvis 10/28/2009  FINDINGS: CT CHEST FINDINGS  Lungs are adequately inflated demonstrate moderate size bilateral pleural effusions with associated posterior bibasilar consolidation likely atelectasis. Airways are within normal.  There is no significant hilar or axillary adenopathy. There is a 1 cm right peritracheal lymph node likely reactive. There is mild calcified plaque over the thoracic aorta. Findings suggesting a stent over the left anterior descending coronary artery. There is mild cardiomegaly. Remaining mediastinal structures are unremarkable.  CT ABDOMEN AND PELVIS FINDINGS  Abdominal images demonstrate evidence of previous cholecystectomy. The liver, spleen, pancreas and adrenal glands are within normal. Kidneys demonstrate no evidence of hydronephrosis or nephrolithiasis. Ureters are within normal. Surgical absence of the appendix.  There is mild calcified plaque over the abdominal aorta and iliac arteries.  There is mild ascites present. There is mild to moderate subcutaneous edema over the flanks and abdominal wall.  Mild diverticulosis of the  colon. Mild wall thickening involving the rectosigmoid colon with minimal stranding of the adjacent pericolonic fat as findings may be due to acute colitis. No evidence of perforation.  Remaining pelvic images demonstrate surgical absence of the uterus. The bladder is within normal.  There is mild spondylosis of the spine with moderate disc disease the L2-3 and L3-4 levels.  IMPRESSION: Mild wall thickening involving the rectosigmoid colon which may be due to an acute colitis. No evidence of perforation.  Mild ascites.  Subcutaneous edema over the abdominal wall/flanks. Moderate size bilateral pleural effusions with associated compressive atelectasis in the lung bases.  Mild cardiomegaly.  Coronary stent.   Electronically Signed   By: Elberta Fortis M.D.   On: 06/25/2015 13:53   Dg Abd 2 Views  06/24/2015   CLINICAL DATA:  Gaseous abdominal distention.  EXAM: ABDOMEN - 2 VIEW  COMPARISON:  06/22/2015  FINDINGS: Diffuse gaseous distention of the colon is demonstrated, suspicious for ileus. No definite evidence of small bowel obstruction. Right upper quadrant surgical clips are noted. No evidence of free air. Opacity noted in left retrocardiac lung base.  IMPRESSION: Diffuse gaseous distention of colon, suspicious for colonic ileus.  Opacity in left lung base. Consider chest radiograph for further evaluation.   Electronically Signed   By: Myles Rosenthal M.D.   On: 06/24/2015 18:24   ASSESSMENT AND PLAN:  82 yr old W F with multiple medical problmes got out of rehab after having treated with IV abxs for Orbital cellulitis. Presented after a fall at home and ongoing diarrhea.   1. C. Diff: Collitis: somewhat better today clinically. Appreciate Dr Markham Jordan and Dr Juliann Pulse assistance with this case. Had BM once. - continue IV flagyl, PO vanco and imipenem per Dr Markham Jordan, given IVIG once. - continue Vanco enema per GI. - WBC down to 15 today - can start enteral nutrition if ok with GI and SURGERY. - will start CLD and advance as tolerated  2. Hypoglycemia: likely as she's NPO, give D50 inj once and start diet now.  3. Hyponatremia: likely due to SIADH, will try one dose of tolvaptan and c/s nephro for further eval  4. Leukocytosis: Sec to c.dif. Worsening.  5.left elbow pain after fall with abnromal xray -appreciate ortho input - no pathology Resume PT  CSW/CM for D/C planning  6. Acute Renal failure: resolved  7. EKG changes: not significant per cardio, troponins neg.   Case discussed with Care Management/Social  Worker. Management plans discussed with the patient, Dr Juliann Pulse and Dr Markham Jordan  CODE STATUS: full  DVT Prophylaxis: heparin  TOTAL TIME (Critical care) TAKING CARE OF THIS PATIENT: 30 minutes.   She looks clinically worse and high risk for cardio-resp failure, multi-organ failure and death.  >50% time spent on counselling and coordination of care - talking with patient and Dr Markham Jordan  POSSIBLE D/C IN 2-3 DAYS, DEPENDING ON CLINICAL CONDITION.  Can monitor as step-down for another 24 hrs and then to floors.  [facility], Maze Corniel M.D on 06/26/2015 at 10:42 AM  Between 7am to 6pm - Pager - [phone]  After 6pm go to [email or web address] - password [email or web address] [facility]  [place] [facility]  Office  [phone]  CC: Primary care physician; Mila Merry, MD

## 2015-06-26 NOTE — Progress Notes (Signed)
Surgery Progress Note  S: No acute issues.  Min pain.  + BM.  Less distended O:Blood pressure 182/73, pulse 73, temperature 97.8 F (36.6 C), temperature source Oral, resp. rate 19, height  (1.6 m), weight 96.616 kg (213 lb), SpO2 95 %. GEN: NAD/A&Ox3 ABD: soft, min tender, moderate distention  A/P 81 yo with C diff, abd distention, improving, nontoxic - continue with current regimen, no acute surgical issues

## 2015-06-26 NOTE — Assessment & Plan Note (Addendum)
-  The patient's blood pressure levels have been running high she has been on her home hypertension regimen. -Continue home blood pressure medications. I have added Vasotec when necessary IV.

## 2015-06-26 NOTE — Consult Note (Signed)
Pt WBC down to 15 from 34 in 2 days.  Abd distended and decreased bowel sounds, chest clear anterior fields, legs with 1-2+ edema, nausea but no vomiting.  Continue current regimen and start clear liquid and advance to full liquids.  Dr. Servando Snare on this weekend but will not see unless you call about a problem.  I will return Monday.

## 2015-06-26 NOTE — Assessment & Plan Note (Addendum)
-  The patient's Lantus and sliding scale insulin were held. We'll continue to monitor her blood glucose levels. -Currently the patient is nothing by mouth due to toxic megacolon. -Will continue to monitor her blood glucose levels every 6 and treat as needed.

## 2015-06-26 NOTE — Progress Notes (Signed)
RN call MD back (Dr. Wynelle Link) about patient's serum sodium value of 120. MD ordered IV lasix 20 mg twice a day, first dose tonight.

## 2015-06-26 NOTE — Care Management (Signed)
It is reported during icu progression that patient presented from [facility].  Patient has had a recent stay at [facility] but had discharged home and had a referral for home health through [facility].  Agency was not able to open patient because she was readmitted to [facility].  It is anticipated that patient will discharge back to [facility] when medically stable.  Spoke with patient's daughter.

## 2015-06-26 NOTE — Progress Notes (Signed)
[facility] Critical Care Medicine Progess Note    ASSESSMENT / PLAN: C. difficile colitis --Continue toxic colon, GI following, continue abx.  --Contributing to sepsis. -I suspect that this is secondary to recent antibiotics treatment for periorbital cellulitis and abscess completed recently at [facility] approximately one month ago.  A-fib --Currently stable.   Pleural effusion --I suspect this is from volume overload and sepsis resulting in mild bibasilar atelectasis. --Was given IV Lasix will continue to monitor  Hyponatremia --Possibly secondary to SIADH. --The patient appears to be hypotonic, urine osmolality and urine sodium studies are pending.  Hyperglycemia --The patient's blood sugars are currently adequately controlled, she is artery on long-acting insulin and sliding scale insulin. --We'll continue to monitor blood sugars.  Atelectasis --Secondary to bilateral pleural effusions.  Acute respiratory failure --This is likely multifactorial due to bilateral pleural effusions, atelectasis, sepsis, obesity, abdominal distention.  Sepsis --Due to C. difficile colitis  Ileus -Secondary to toxic: From severe C. difficile colitis. -Patient appears to be doing slightly better today we will continue to monitor her status.  Hypoglycemia associated with diabetes -The patient's Lantus and sliding scale insulin were held. We'll continue to monitor her blood glucose levels. -Currently the patient is nothing by mouth due to toxic megacolon. -Will continue to monitor her blood glucose levels every 6 and treat as needed.  Essential hypertension -The patient's blood pressure levels have been running high she has been on her home hypertension regimen. -I have also added labetalol IV when necessary to be used as needed    ----------------------------------------   Name: MADILYN CEPHAS MRN: [id number] DOB: 04-24-1932    ADMISSION DATE:  06/21/2015    SUBJECTIVE:   The  patient appears slightly improved today she appears to be more comfortable, she has no new complaints. She has not had any further bowel movements.  Review of Systems:  Constitutional: Feels well. Cardiovascular: No chest pain.  Pulmonary: Denies dyspnea.   The remainder of systems were reviewed and were found to be negative other than what is documented in the HPI.    VITAL SIGNS: Temp:  [97.4 F (36.3 C)-98 F (36.7 C)] 98 F (36.7 C) (07/29 0700) Pulse Rate:  [50-73] 62 (07/29 1000) Resp:  [15-24] 17 (07/29 1000) BP: (150-182)/(46-73) 169/53 mmHg (07/29 1000) SpO2:  [93 %-99 %] 98 % (07/29 1000)    INTAKE / OUTPUT:  Intake/Output Summary (Last 24 hours) at 06/26/15 1155 Last data filed at 06/26/15 0000  Gross per 24 hour  Intake      0 ml  Output    800 ml  Net   -800 ml    PHYSICAL EXAMINATION: Physical Examination:   VS: BP 169/53 mmHg  Pulse 62  Temp(Src) 98 F (36.7 C) (Oral)  Resp 17  Ht  (1.6 m)  Wt 96.616 kg (213 lb)  BMI 37.74 kg/m2  SpO2 98%  General Appearance: No distress  Neuro:without focal findings, mental status normal. HEENT: PERRLA, EOM intact. Pulmonary: normal breath sounds   CardiovascularNormal S1,S2.  No m/r/g.   Abdomen: Benign, Soft, non-tender. Renal:  No costovertebral tenderness  GU:  Not performed at this time. Endocrine: No evident thyromegaly. Skin:   warm, no rashes, no ecchymosis  Extremities: normal, no cyanosis, clubbing.   LABS:  CBC  Recent Labs Lab 06/23/15 0528 06/25/15 0510 06/26/15 0931  WBC 33.9* 32.3* 15.6*  HGB 8.9* 8.4* 8.4*  HCT 27.0* 26.2* 25.1*  PLT 452* 474* 392   Coag's No results for  input(s): APTT, INR in the last 168 hours. BMET  Recent Labs Lab 06/21/15 1538 06/22/15 0408 06/23/15 0528 06/25/15 0510  NA 122* 124* 122* 121*  K 3.6 3.5  --  4.4  CL 86* 90*  --  90*  CO2 24 23  --  24  BUN 32* 29*  --  30*  CREATININE 1.29* 1.16* 0.97 1.00  GLUCOSE 225* 122*  --  175*    Electrolytes  Recent Labs Lab 06/21/15 1538 06/22/15 0408 06/25/15 0510  CALCIUM 8.3* 8.1* 8.0*   Sepsis Markers No results for input(s): LATICACIDVEN, PROCALCITON, O2SATVEN in the last 168 hours. ABG No results for input(s): PHART, PCO2ART, PO2ART in the last 168 hours. Liver Enzymes  Recent Labs Lab 06/21/15 1538  AST 16  ALT 12*  ALKPHOS 89  BILITOT 0.4  ALBUMIN 2.5*   Cardiac Enzymes  Recent Labs Lab 06/25/15 1316 06/25/15 1935  TROPONINI 0.04* 0.04*   Glucose  Recent Labs Lab 06/25/15 1109 06/25/15 1758 06/25/15 2118 06/26/15 0733 06/26/15 1002 06/26/15 1139  GLUCAP 165* 104* 97 68 129* 103*    Imaging Ct Abdomen Pelvis Wo Contrast  06/25/2015   CLINICAL DATA:  Recently on IV antibiotics for high infection. Began to have diarrhea with fall today due to weakness. Positive stool culture for C difficile. Previous hysterectomy, appendectomy and cholecystectomy.  EXAM: CT CHEST, ABDOMEN AND PELVIS WITHOUT CONTRAST  TECHNIQUE: Multidetector CT imaging of the chest, abdomen and pelvis was performed following the standard protocol without IV contrast.  COMPARISON:  CT abdomen/ pelvis 10/28/2009  FINDINGS: CT CHEST FINDINGS  Lungs are adequately inflated demonstrate moderate size bilateral pleural effusions with associated posterior bibasilar consolidation likely atelectasis. Airways are within normal.  There is no significant hilar or axillary adenopathy. There is a 1 cm right peritracheal lymph node likely reactive. There is mild calcified plaque over the thoracic aorta. Findings suggesting a stent over the left anterior descending coronary artery. There is mild cardiomegaly. Remaining mediastinal structures are unremarkable.  CT ABDOMEN AND PELVIS FINDINGS  Abdominal images demonstrate evidence of previous cholecystectomy. The liver, spleen, pancreas and adrenal glands are within normal. Kidneys demonstrate no evidence of hydronephrosis or nephrolithiasis. Ureters  are within normal. Surgical absence of the appendix.  There is mild calcified plaque over the abdominal aorta and iliac arteries.  There is mild ascites present. There is mild to moderate subcutaneous edema over the flanks and abdominal wall.  Mild diverticulosis of the colon. Mild wall thickening involving the rectosigmoid colon with minimal stranding of the adjacent pericolonic fat as findings may be due to acute colitis. No evidence of perforation.  Remaining pelvic images demonstrate surgical absence of the uterus. The bladder is within normal.  There is mild spondylosis of the spine with moderate disc disease the L2-3 and L3-4 levels.  IMPRESSION: Mild wall thickening involving the rectosigmoid colon which may be due to an acute colitis. No evidence of perforation.  Mild ascites. Subcutaneous edema over the abdominal wall/flanks. Moderate size bilateral pleural effusions with associated compressive atelectasis in the lung bases.  Mild cardiomegaly.  Coronary stent.   Electronically Signed   By: Elberta Fortis M.D.   On: 06/25/2015 13:53   Ct Chest Wo Contrast  06/25/2015   CLINICAL DATA:  Recently on IV antibiotics for high infection. Began to have diarrhea with fall today due to weakness. Positive stool culture for C difficile. Previous hysterectomy, appendectomy and cholecystectomy.  EXAM: CT CHEST, ABDOMEN AND PELVIS WITHOUT CONTRAST  TECHNIQUE: Multidetector  CT imaging of the chest, abdomen and pelvis was performed following the standard protocol without IV contrast.  COMPARISON:  CT abdomen/ pelvis 10/28/2009  FINDINGS: CT CHEST FINDINGS  Lungs are adequately inflated demonstrate moderate size bilateral pleural effusions with associated posterior bibasilar consolidation likely atelectasis. Airways are within normal.  There is no significant hilar or axillary adenopathy. There is a 1 cm right peritracheal lymph node likely reactive. There is mild calcified plaque over the thoracic aorta. Findings  suggesting a stent over the left anterior descending coronary artery. There is mild cardiomegaly. Remaining mediastinal structures are unremarkable.  CT ABDOMEN AND PELVIS FINDINGS  Abdominal images demonstrate evidence of previous cholecystectomy. The liver, spleen, pancreas and adrenal glands are within normal. Kidneys demonstrate no evidence of hydronephrosis or nephrolithiasis. Ureters are within normal. Surgical absence of the appendix.  There is mild calcified plaque over the abdominal aorta and iliac arteries.  There is mild ascites present. There is mild to moderate subcutaneous edema over the flanks and abdominal wall.  Mild diverticulosis of the colon. Mild wall thickening involving the rectosigmoid colon with minimal stranding of the adjacent pericolonic fat as findings may be due to acute colitis. No evidence of perforation.  Remaining pelvic images demonstrate surgical absence of the uterus. The bladder is within normal.  There is mild spondylosis of the spine with moderate disc disease the L2-3 and L3-4 levels.  IMPRESSION: Mild wall thickening involving the rectosigmoid colon which may be due to an acute colitis. No evidence of perforation.  Mild ascites. Subcutaneous edema over the abdominal wall/flanks. Moderate size bilateral pleural effusions with associated compressive atelectasis in the lung bases.  Mild cardiomegaly.  Coronary stent.   Electronically Signed   By: Elberta Fortis M.D.   On: 06/25/2015 13:53     --Wells Guiles, MD.  Pager [phone] [facility] Office Number: [phone]  Santiago Glad, M.D.  Stephanie Acre, M.D.  Carolyne Fiscal, M.D  Critical Care Attestation.  I have personally obtained a history, examined the patient, evaluated laboratory and imaging results, formulated the assessment and plan and placed orders. The Patient requires high complexity decision making for assessment and support, frequent evaluation and titration of  therapies, application of advanced monitoring technologies and extensive interpretation of multiple databases. The patient has critical illness that could lead imminently to failure of 1 or more organ systems and requires the highest level of physician preparedness to intervene.  Critical Care Time devoted to patient care services described in this note is 35 minutes and is exclusive of time spent in procedures.

## 2015-06-26 NOTE — Progress Notes (Signed)
Initial Nutrition Assessment     INTERVENTION:  1)Coordination of Care: nutritional poc was discussed with MD Lundquist this AM; MD did not want nutrition support at this time, no TPN. Noted diet advanced to CL by MD Sherryll Burger this AM, will add supplement. If unable to advance diet after several days, recommend consideration of nutrition support, EN if feasible.  2) Medical Food Supplement Therapy: will send Boost Breeze TID on meal trays while pt on CL diet; once diet advanced to at least FL, recommend changing to Glucerna, pt likes Butter Pecan 3) Meals and Snacks: pt reports she has many food intolerances/allergies including milk, chocolate, nuts    NUTRITION DIAGNOSIS:   Inadequate oral intake related to inability to eat, altered GI function, acute illness as evidenced by NPO status.   GOAL:   Patient will meet greater than or equal to 90% of their needs   MONITOR:    (Energy Intake: diet progrsesion, Digestive System, Electrolyte/Renal Profile, Glucose Profile)  REASON FOR ASSESSMENT:   Consult Assessment of nutrition requirement/status  ASSESSMENT:    Pt admitted with cdiff colitis, ARF; surgery and GI following; pt hyponatremic, hypoglycemic this AM  Past Medical History  Diagnosis Date  . Diabetes mellitus without complication   . Hypertension   . CCF (congestive cardiac failure) 06/04/2015  . Varicose veins     of leg  . Bowel habit changes   . Anemia   . Diabetes mellitus with renal manifestation 03/19/2009  . Vitamin D deficiency   . Hyperglyceridemia, pure   . Hyperlipidemia 02/01/2010  . Hyponatremia   . Mitral valve disorder 04/01/2009  . Atherosclerosis of coronary artery 08/04/2009  . CHF (congestive heart failure)     EF 30-35% on echo 2014  . Allergy 04/04/2009  . GERD (gastroesophageal reflux disease) 11/06/2009  . Hemorrhoids, external without complications 08/04/2009  . Arthritis   . Arthralgia   . Myalgia   . Palpitations   . Chronic nausea    . Malaise and fatigue   . Edema   . Atrial fibrillation 04/01/2009  . Dysuria    Past Surgical History  Procedure Laterality Date  . Tonsillectomy    . Breast surgery    . Abdominal hysterectomy    . Appendectomy    . Hernia repair    . Cholecystectomy    . Stents  2011     Diet Order:  Diet clear liquid Room service appropriate?: Yes; Fluid consistency:: Thin   Energy Intake: pt NPO This AM, diet just advanced to CL. Pt on diabetic diet up until 7/28 but no po intake documented. Pt reports she has eaten little, if anything, since admission.   Food and Nutrition related history: pt reports appetite at home as been poor, not eating well. Unable to elaborate.   Skin:  Reviewed, no issues   Digestive System: abdominal distention, +nausea, no vomiting, +Cdiff  Last BM:  7/29 loose watery stool, Cdiff positive   Nutrition Focused Physical Exam: physical exam very difficult as pt very edematous; appears to not have any subcutaneous fat loss or muscle wasting, but again very difficult to assess. Severe edema noted in bilateral upper and lower extremities which pt reports is painful  Meds: acidophilus, ss novolog, MVI, zofran, ultram  Height:   Ht Readings from Last 1 Encounters:  06/21/15  (1.6 m)    Weight: Pt reports she weighed 178 pounds at [facility] this year; noted weight trend below. Pt with weight gain, likely at least partially  attributed to fluid gains  Wt Readings from Last 1 Encounters:  06/21/15 213 lb (96.616 kg)   Wt Readings from Last 10 Encounters:  06/21/15  213 lb (96.616 kg)  04/14/15  190 lb (86.183 kg)  05/03/15  190 lb (86.183 kg)   BMI:  Body mass index is 37.74 kg/(m^2).  Estimated Nutritional Needs:   Kcal:  1744-1889 kcals (BEE 1118, 1.3 AF, 1.2-1.3 IF) using IBW 52.3 kg  Protein:  62-73 g (1.2-1.4 g/kg)   Fluid:  1560-1820 mL (30-35 ml/kg)    HIGH Care Level  Romelle Starcher MS, RD, LDN [phone] Pager

## 2015-06-27 DIAGNOSIS — J189 Pneumonia, unspecified organism: Secondary | ICD-10-CM | POA: Insufficient documentation

## 2015-06-27 LAB — CBC
HCT: 26.7 % — ABNORMAL LOW (ref 35.0–47.0)
HEMOGLOBIN: 8.7 g/dL — AB (ref 12.0–16.0)
MCH: 28.1 pg (ref 26.0–34.0)
MCHC: 32.8 g/dL (ref 32.0–36.0)
MCV: 85.8 fL (ref 80.0–100.0)
PLATELETS: 316 10*3/uL (ref 150–440)
RBC: 3.11 MIL/uL — ABNORMAL LOW (ref 3.80–5.20)
RDW: 15.2 % — ABNORMAL HIGH (ref 11.5–14.5)
WBC: 8.5 10*3/uL (ref 3.6–11.0)

## 2015-06-27 LAB — SODIUM
SODIUM: 124 mmol/L — AB (ref 135–145)
SODIUM: 126 mmol/L — AB (ref 135–145)
SODIUM: 126 mmol/L — AB (ref 135–145)
SODIUM: 126 mmol/L — AB (ref 135–145)
Sodium: 127 mmol/L — ABNORMAL LOW (ref 135–145)

## 2015-06-27 LAB — BASIC METABOLIC PANEL
Anion gap: 9 (ref 5–15)
BUN: 30 mg/dL — ABNORMAL HIGH (ref 6–20)
CO2: 25 mmol/L (ref 22–32)
CREATININE: 0.98 mg/dL (ref 0.44–1.00)
Calcium: 7.5 mg/dL — ABNORMAL LOW (ref 8.9–10.3)
Chloride: 92 mmol/L — ABNORMAL LOW (ref 101–111)
GFR calc Af Amer: >60 mL/min (ref >60)
GFR calc non Af Amer: 53 mL/min — ABNORMAL LOW (ref >60)
Glucose, Bld: 300 mg/dL — ABNORMAL HIGH (ref 65–99)
Potassium: 3.7 mmol/L (ref 3.5–5.1)
SODIUM: 126 mmol/L — AB (ref 135–145)

## 2015-06-27 LAB — GLUCOSE, CAPILLARY
Glucose-Capillary: 175 mg/dL — ABNORMAL HIGH (ref 65–99)
Glucose-Capillary: 304 mg/dL — ABNORMAL HIGH (ref 65–99)
Glucose-Capillary: 240 mg/dL — ABNORMAL HIGH (ref 65–99)
Glucose-Capillary: 356 mg/dL — ABNORMAL HIGH (ref 65–99)

## 2015-06-27 MED ORDER — HYDRALAZINE HCL 20 MG/ML IJ SOLN: 10.0000 mg | Freq: Four times a day (QID) | INTRAMUSCULAR | Status: DC | PRN

## 2015-06-27 MED ORDER — HYDRALAZINE HCL 20 MG/ML IJ SOLN
10.0000 mg | Freq: Once | INTRAMUSCULAR | Status: AC
  Administered 2015-06-27: 10 mg via INTRAVENOUS

## 2015-06-27 MED ORDER — HYDRALAZINE HCL 20 MG/ML IJ SOLN
INTRAMUSCULAR | Status: AC
  Administered 2015-06-27: 10 mg via INTRAVENOUS
  Filled 2015-06-27: qty 1

## 2015-06-27 MED ORDER — ENALAPRILAT 1.25 MG/ML IV SOLN
1.2500 mg | Freq: Four times a day (QID) | INTRAVENOUS | Status: DC | PRN
  Administered 2015-06-27 – 2015-07-08 (×2): 1.25 mg via INTRAVENOUS
  Filled 2015-06-27 (×3): qty 1

## 2015-06-27 NOTE — Progress Notes (Signed)
Notified MD about patient's persistent high blood pressure post IV hydralazine. MD ordered RN to reschedule10:00 dose of lotensin to now.

## 2015-06-27 NOTE — Progress Notes (Signed)
Spoke with MD on call (Dr. Sheryle Hail) about patient's blood pressure and already given patient's prn labetolol order. Gave order for one time dose of 10 mg IV hydralazine.

## 2015-06-27 NOTE — Progress Notes (Signed)
[facility]  ROUNDING NOTE   Subjective:   Na 126 (124) (120) (117)  Objective:  Vital signs in last 24 hours:  Temp:  [96.3 F (35.7 C)-96.9 F (36.1 C)] 96.3 F (35.7 C) (07/30 0437) Pulse Rate:  [26-79] 74 (07/30 0735) Resp:  [15-21] 15 (07/30 0735) BP: (139-186)/(49-86) 139/73 mmHg (07/30 0735) SpO2:  [94 %-100 %] 97 % (07/30 0735)  Weight change:  Filed Weights   06/21/15 1508  Weight: 96.616 kg (213 lb)    Intake/Output: I/O last 3 completed shifts: In: 1470 [P.O.:720; IV Piggyback:750] Out: 4050 [Urine:4050]   Intake/Output this shift:     Physical Exam: General: NAD,   Head: Normocephalic, atraumatic. Moist oral mucosal membranes  Eyes: Anicteric, PERRL  Neck: Supple, trachea midline  Lungs:  Clear to auscultation  Heart: Regular rate and rhythm  Abdomen:  Soft, nontender,   Extremities: 2+ peripheral edema.  Neurologic: Nonfocal, moving all four extremities  Skin: No lesions       Basic Metabolic Panel:  Recent Labs Lab 06/21/15 1538 06/22/15 0408 06/23/15 0528 06/25/15 0510 06/26/15 1537 06/26/15 2138 06/27/15 0203 06/27/15 0615  NA 122* 124* 122* 121* 117* 120* 124* 126*  K 3.6 3.5  --  4.4 4.3  --   --   --   CL 86* 90*  --  90* 86*  --   --   --   CO2 24 23  --  24 23  --   --   --   GLUCOSE 225* 122*  --  175* 162*  --   --   --   BUN 32* 29*  --  30* 29*  --   --   --   CREATININE 1.29* 1.16* 0.97 1.00 0.93  --   --   --   CALCIUM 8.3* 8.1*  --  8.0* 7.5*  --   --   --     Liver Function Tests:  Recent Labs Lab 06/21/15 1538 06/26/15 1537  AST 16 26  ALT 12* 14  ALKPHOS 89 71  BILITOT 0.4 0.5  PROT 5.3* 5.4*  ALBUMIN 2.5* 2.2*   No results for input(s): LIPASE, AMYLASE in the last 168 hours. No results for input(s): AMMONIA in the last 168 hours.  CBC:  Recent Labs Lab 06/21/15 1538 06/22/15 0408 06/23/15 0528 06/25/15 0510 06/26/15 0931  WBC 22.5* 27.1* 33.9* 32.3* 15.6*  NEUTROABS 19.5*  --    --   --   --   HGB 8.1* 7.7* 8.9* 8.4* 8.4*  HCT 25.0* 23.4* 27.0* 26.2* 25.1*  MCV 85.7 85.4 85.0 85.3 84.5  PLT 393 397 452* 474* 392    Cardiac Enzymes:  Recent Labs Lab 06/25/15 1316 06/25/15 1935  TROPONINI 0.04* 0.04*    BNP: Invalid input(s): POCBNP  CBG:  Recent Labs Lab 06/26/15 1139 06/26/15 1639 06/26/15 2007 06/26/15 2131 06/27/15 0732  GLUCAP 103* 176* 204* 193* 175*    Microbiology: Results for orders placed or performed during the hospital encounter of 06/21/15  Stool culture     Status: None   Collection Time: 06/21/15  5:17 PM  Result Value Ref Range Status   Specimen Description STOOL  Final   Special Requests Normal  Final   Culture   Final    NO SALMONELLA OR SHIGELLA ISOLATED No Pathogenic E. coli detected NO CAMPYLOBACTER DETECTED    Report Status 06/23/2015 FINAL  Final  C difficile quick scan w PCR reflex ([facility] only)  Status: Abnormal   Collection Time: 06/21/15  5:17 PM  Result Value Ref Range Status   C Diff antigen POSITIVE (A) NEGATIVE Final   C Diff toxin POSITIVE (A) NEGATIVE Final   C Diff interpretation   Final    Positive for toxigenic C. difficile, active toxin production present.    Comment: CRITICAL RESULT CALLED TO, READ BACK BY AND VERIFIED WITH: DONALD SWEENEY ON 06/21/15 AT 1755 BY JEF   MRSA PCR Screening     Status: None   Collection Time: 06/25/15 10:48 AM  Result Value Ref Range Status   MRSA by PCR NEGATIVE NEGATIVE Final    Comment:        The GeneXpert MRSA Assay (FDA approved for NASAL specimens only), is one component of a comprehensive MRSA colonization surveillance program. It is not intended to diagnose MRSA infection nor to guide or monitor treatment for MRSA infections.     Coagulation Studies: No results for input(s): LABPROT, INR in the last 72 hours.  Urinalysis: No results for input(s): COLORURINE, LABSPEC, PHURINE, GLUCOSEU, HGBUR, BILIRUBINUR, KETONESUR, PROTEINUR, UROBILINOGEN,  NITRITE, LEUKOCYTESUR in the last 72 hours.  Invalid input(s): APPERANCEUR    Imaging: Ct Abdomen Pelvis Wo Contrast  06/25/2015   CLINICAL DATA:  Recently on IV antibiotics for high infection. Began to have diarrhea with fall today due to weakness. Positive stool culture for C difficile. Previous hysterectomy, appendectomy and cholecystectomy.  EXAM: CT CHEST, ABDOMEN AND PELVIS WITHOUT CONTRAST  TECHNIQUE: Multidetector CT imaging of the chest, abdomen and pelvis was performed following the standard protocol without IV contrast.  COMPARISON:  CT abdomen/ pelvis 10/28/2009  FINDINGS: CT CHEST FINDINGS  Lungs are adequately inflated demonstrate moderate size bilateral pleural effusions with associated posterior bibasilar consolidation likely atelectasis. Airways are within normal.  There is no significant hilar or axillary adenopathy. There is a 1 cm right peritracheal lymph node likely reactive. There is mild calcified plaque over the thoracic aorta. Findings suggesting a stent over the left anterior descending coronary artery. There is mild cardiomegaly. Remaining mediastinal structures are unremarkable.  CT ABDOMEN AND PELVIS FINDINGS  Abdominal images demonstrate evidence of previous cholecystectomy. The liver, spleen, pancreas and adrenal glands are within normal. Kidneys demonstrate no evidence of hydronephrosis or nephrolithiasis. Ureters are within normal. Surgical absence of the appendix.  There is mild calcified plaque over the abdominal aorta and iliac arteries.  There is mild ascites present. There is mild to moderate subcutaneous edema over the flanks and abdominal wall.  Mild diverticulosis of the colon. Mild wall thickening involving the rectosigmoid colon with minimal stranding of the adjacent pericolonic fat as findings may be due to acute colitis. No evidence of perforation.  Remaining pelvic images demonstrate surgical absence of the uterus. The bladder is within normal.  There is mild  spondylosis of the spine with moderate disc disease the L2-3 and L3-4 levels.  IMPRESSION: Mild wall thickening involving the rectosigmoid colon which may be due to an acute colitis. No evidence of perforation.  Mild ascites. Subcutaneous edema over the abdominal wall/flanks. Moderate size bilateral pleural effusions with associated compressive atelectasis in the lung bases.  Mild cardiomegaly.  Coronary stent.   Electronically Signed   By: Elberta Fortis M.D.   On: 06/25/2015 13:53   Ct Chest Wo Contrast  06/25/2015   CLINICAL DATA:  Recently on IV antibiotics for high infection. Began to have diarrhea with fall today due to weakness. Positive stool culture for C difficile. Previous hysterectomy, appendectomy and  cholecystectomy.  EXAM: CT CHEST, ABDOMEN AND PELVIS WITHOUT CONTRAST  TECHNIQUE: Multidetector CT imaging of the chest, abdomen and pelvis was performed following the standard protocol without IV contrast.  COMPARISON:  CT abdomen/ pelvis 10/28/2009  FINDINGS: CT CHEST FINDINGS  Lungs are adequately inflated demonstrate moderate size bilateral pleural effusions with associated posterior bibasilar consolidation likely atelectasis. Airways are within normal.  There is no significant hilar or axillary adenopathy. There is a 1 cm right peritracheal lymph node likely reactive. There is mild calcified plaque over the thoracic aorta. Findings suggesting a stent over the left anterior descending coronary artery. There is mild cardiomegaly. Remaining mediastinal structures are unremarkable.  CT ABDOMEN AND PELVIS FINDINGS  Abdominal images demonstrate evidence of previous cholecystectomy. The liver, spleen, pancreas and adrenal glands are within normal. Kidneys demonstrate no evidence of hydronephrosis or nephrolithiasis. Ureters are within normal. Surgical absence of the appendix.  There is mild calcified plaque over the abdominal aorta and iliac arteries.  There is mild ascites present. There is mild to  moderate subcutaneous edema over the flanks and abdominal wall.  Mild diverticulosis of the colon. Mild wall thickening involving the rectosigmoid colon with minimal stranding of the adjacent pericolonic fat as findings may be due to acute colitis. No evidence of perforation.  Remaining pelvic images demonstrate surgical absence of the uterus. The bladder is within normal.  There is mild spondylosis of the spine with moderate disc disease the L2-3 and L3-4 levels.  IMPRESSION: Mild wall thickening involving the rectosigmoid colon which may be due to an acute colitis. No evidence of perforation.  Mild ascites. Subcutaneous edema over the abdominal wall/flanks. Moderate size bilateral pleural effusions with associated compressive atelectasis in the lung bases.  Mild cardiomegaly.  Coronary stent.   Electronically Signed   By: Elberta Fortis M.D.   On: 06/25/2015 13:53     Medications:     . acidophilus  1 capsule Oral BID  . antiseptic oral rinse  7 mL Mouth Rinse BID  . aspirin EC  81 mg Oral QHS  . atorvastatin  80 mg Oral QPM  . benazepril  40 mg Oral BID  . carvedilol  25 mg Oral BID  . diltiazem  120 mg Oral Daily  . dorzolamide  1 drop Both Eyes TID  . famotidine (PEPCID) IV  20 mg Intravenous Q24H  . feeding supplement  1 Container Oral TID WC  . furosemide  20 mg Intravenous Q12H  . heparin subcutaneous  5,000 Units Subcutaneous 3 times per day  . hydrALAZINE  100 mg Oral TID  . hydrocortisone cream   Topical QID  . imipenem-cilastatin  500 mg Intravenous 3 times per day  . insulin aspart  0-15 Units Subcutaneous TID WC  . insulin aspart  0-5 Units Subcutaneous QHS  . ipratropium-albuterol  3 mL Nebulization Q6H  . latanoprost  1 drop Both Eyes QPM  . metronidazole  500 mg Intravenous Q8H  . multivitamin with minerals  1 tablet Oral q morning - 10a  . potassium chloride  10 mEq Oral Daily  . sertraline  50 mg Oral Daily  . timolol  1 drop Both Eyes BID  . vancomycin  250 mg Oral 4  times per day   albuterol, alum & mag hydroxide-simeth, diphenhydrAMINE, labetalol, ondansetron (ZOFRAN) IV, traMADol  Assessment/ Plan:  [name] is a 81 y.o. [place] female with diabetes mellitus type II, hypertension, congestive heart failure, hyperlipidemia, coronary artery disease, atrial fibrillation, GERD, mitral  valve disorder who was admitted to [facility] on 06/21/2015 for C. Diff colitis. Patient was found to have a serum sodium of 122. Baseline of 132 (05/03/2015).   1. Acute hyponatremia on chronic hyponatremia: hypervolemic on examination. Hypo-osmotic with measured osm of 257. Urine osm pending. Tolvaptan on 7/29 Seems that acute hyponatremia is hypervolemic while chronic hyponatremia could be due to congestive heart failure - Continue furosemie 20mg  IV, continue serial sodium checks.    2. Acute kidney injury on chronic kidney disease stage III: creatinine now back to baseline however may be dilutional from volume overload. Acute renal failure secondary to prerenal azotemia. Chronic kidney disease seems to be age related.   3. Hypertension: elevated on examination. Most likely volume driven. Elevated overnight, given IV hydralazine.  - home regimen of benazepril, carvedilol, diltiazem, clonidine, furosemide, hydralazine - if increasing blood pressures, will reevaluate PO regimen.     LOS: 6 Khadija Thier 7/30/20168:44 AM

## 2015-06-27 NOTE — Progress Notes (Signed)
[facility] Progess Note    ASSESSMENT / PLAN: C. difficile colitis --Continue toxic colon, GI following, continue abx.  --Contributing to sepsis. -I suspect that this is secondary to recent antibiotics treatment for periorbital cellulitis and abscess completed recently at [facility] approximately one month ago.  A-fib --Currently stable.   Pleural effusion --I suspect this is from volume overload and sepsis resulting in mild bibasilar atelectasis. --Was given IV Lasix will continue to monitor  Hyponatremia --Nephro following.   Hyperglycemia --The patient's blood sugars are currently adequately controlled, she is artery on long-acting insulin and sliding scale insulin. --We'll continue to monitor blood sugars.  Atelectasis --Secondary to bilateral pleural effusions.  Acute respiratory failure --This is likely multifactorial due to bilateral pleural effusions, atelectasis, sepsis, obesity, abdominal distention.  Sepsis --Due to C. difficile colitis  Ileus -Secondary to toxic: From severe C. difficile colitis. -Patient appears to be doing slightly better today we will continue to monitor her status.  Hypoglycemia associated with diabetes -The patient's Lantus and sliding scale insulin were held. We'll continue to monitor her blood glucose levels. -Currently the patient is nothing by mouth due to toxic megacolon. -Will continue to monitor her blood glucose levels every 6 and treat as needed.  Essential hypertension -The patient's blood pressure levels have been running high she has been on her home hypertension regimen. -Continue home blood pressure medications. I have added Vasotec when necessary IV.     ----------------------------------------   Name: Katherine [name] MRN: [id number] DOB: 1932-10-24    ADMISSION DATE:  06/21/2015    SUBJECTIVE:   The patient appears slightly improved today she appears to be more comfortable, she has no  new complaints. She has not had any further bowel movements.  Review of Systems:  Constitutional: Feels well. Cardiovascular: No chest pain.  Pulmonary: Denies dyspnea.   The remainder of systems were reviewed and were found to be negative other than what is documented in the HPI.    VITAL SIGNS: Temp:  [96.3 F (35.7 C)-96.9 F (36.1 C)] 96.3 F (35.7 C) (07/30 0437) Pulse Rate:  [26-81] 61 (07/30 1015) Resp:  [15-23] 23 (07/30 1015) BP: (139-186)/(49-90) 160/66 mmHg (07/30 1000) SpO2:  [94 %-100 %] 97 % (07/30 1015)    INTAKE / OUTPUT:  Intake/Output Summary (Last 24 hours) at 06/27/15 1056 Last data filed at 06/27/15 0601  Gross per 24 hour  Intake   1270 ml  Output   3750 ml  Net  -2480 ml    PHYSICAL EXAMINATION: Physical Examination:   VS: BP 160/66 mmHg  Pulse 61  Temp(Src) 96.3 F (35.7 C) (Oral)  Resp 23  Ht  (1.6 m)  Wt 96.616 kg (213 lb)  BMI 37.74 kg/m2  SpO2 97%  General Appearance: No distress  Neuro:without focal findings, mental status normal. HEENT: PERRLA, EOM intact. Pulmonary: normal breath sounds   CardiovascularNormal S1,S2.  No m/r/g.   Abdomen: Benign, Soft, non-tender. Renal:  No costovertebral tenderness  GU:  Not performed at this time. Endocrine: No evident thyromegaly. Skin:   warm, no rashes, no ecchymosis  Extremities: normal, no cyanosis, clubbing.   LABS:  CBC  Recent Labs Lab 06/23/15 0528 06/25/15 0510 06/26/15 0931  WBC 33.9* 32.3* 15.6*  HGB 8.9* 8.4* 8.4*  HCT 27.0* 26.2* 25.1*  PLT 452* 474* 392   Coag's No results for input(s): APTT, INR in the last 168 hours. BMET  Recent Labs Lab 06/22/15 0408 06/23/15 0528 06/25/15 0510 06/26/15  1537  06/27/15 0203 06/27/15 0615 06/27/15 0808  NA 124* 122* 121* 117*  < > 124* 126* 127*  K 3.5  --  4.4 4.3  --   --   --   --   CL 90*  --  90* 86*  --   --   --   --   CO2 23  --  24 23  --   --   --   --   BUN 29*  --  30* 29*  --   --   --   --     CREATININE 1.16* 0.97 1.00 0.93  --   --   --   --   GLUCOSE 122*  --  175* 162*  --   --   --   --   < > = values in this interval not displayed. Electrolytes  Recent Labs Lab 06/22/15 0408 06/25/15 0510 06/26/15 1537  CALCIUM 8.1* 8.0* 7.5*   Sepsis Markers No results for input(s): LATICACIDVEN, PROCALCITON, O2SATVEN in the last 168 hours. ABG No results for input(s): PHART, PCO2ART, PO2ART in the last 168 hours. Liver Enzymes  Recent Labs Lab 06/21/15 1538 06/26/15 1537  AST 16 26  ALT 12* 14  ALKPHOS 89 71  BILITOT 0.4 0.5  ALBUMIN 2.5* 2.2*   Cardiac Enzymes  Recent Labs Lab 06/25/15 1316 06/25/15 1935  TROPONINI 0.04* 0.04*   Glucose  Recent Labs Lab 06/26/15 1002 06/26/15 1139 06/26/15 1639 06/26/15 2007 06/26/15 2131 06/27/15 0732  GLUCAP 129* 103* 176* 204* 193* 175*    Imaging No results found.   Wells Guiles, MD.  Pager [phone] [facility] Office Number: [phone]  Santiago Glad, M.D.  Stephanie Acre, M.D.  Carolyne Fiscal, M.D

## 2015-06-27 NOTE — Progress Notes (Signed)
[facility] - [place] at [facility]   PATIENT NAME: Katherine [name]    MR#:  [id number]  DATE OF BIRTH:  [birth year]  SUBJECTIVE:  Feels  Weak, mild shortness of breath. No diarrhea. REVIEW OF SYSTEMS:   Review of Systems  Constitutional: Negative for fever, chills and weight loss.  HENT: Negative for ear discharge, ear pain and nosebleeds.   Eyes: Negative for blurred vision, pain and discharge.  Respiratory: Positive for shortness of breath. Negative for sputum production, wheezing and stridor.   Cardiovascular: Positive for leg swelling. Negative for chest pain, palpitations, orthopnea and PND.  Gastrointestinal: Positive for nausea. Negative for vomiting.  Genitourinary: Negative for urgency and frequency.  Musculoskeletal: Negative for back pain and joint pain.  Neurological: Positive for weakness. Negative for sensory change, speech change and focal weakness.  Psychiatric/Behavioral: Negative for depression. The patient is not nervous/anxious.   All other systems reviewed and are negative.  Tolerating Diet: not able to eat much, very nauseous and distended abdomen Tolerating PT: eval pending DRUG ALLERGIES:   Allergies  Allergen Reactions  . Benzocaine Other (See Comments)    Unknown reaction  . Contrast Media [Iodinated Diagnostic Agents] Other (See Comments)    Tachycardia, SVT  . Other Other (See Comments)    MRI dye--reaction unknown.  Marland Kitchen Penicillin V Potassium Other (See Comments)  . Penicillins Other (See Comments)    Reaction: pt doesn't know, b/c it's been a long time ago.   . Sulfa Antibiotics Other (See Comments)    Reaction: pt doesn't know, b/c it's been a long time ago.    VITALS:  Blood pressure 160/66, pulse 61, temperature 96.3 F (35.7 C), temperature source Oral, resp. rate 23, height 5\' 3"  (1.6 m), weight 96.616 kg (213 lb), SpO2 97 %. PHYSICAL EXAMINATION:  Physical Exam  Constitutional: She is oriented to person, place,  and time and well-developed, well-nourished, and in no distress.  HENT:  Head: Normocephalic and atraumatic.  Eyes: Conjunctivae and EOM are normal. Pupils are equal, round, and reactive to light.  Neck: Normal range of motion. Neck supple. No tracheal deviation present. No thyromegaly present.  Cardiovascular: Normal rate, regular rhythm and normal heart sounds.   Pulmonary/Chest: Effort normal and breath sounds normal. No respiratory distress. She has no wheezes. She exhibits no tenderness.  Abdominal: Soft. Bowel sounds are normal. She exhibits distension (gaseous). There is tenderness.  Musculoskeletal: Normal range of motion.  Neurological: She is alert and oriented to person, place, and time. No cranial nerve deficit.  Skin: Skin is warm and dry. No rash noted.  Psychiatric: Mood and affect normal.   LABORATORY PANEL:   CBC  Recent Labs Lab 06/26/15 0931  WBC 15.6*  HGB 8.4*  HCT 25.1*  PLT 392    Chemistries   Recent Labs Lab 06/26/15 1537  06/27/15 0808  NA 117*  < > 127*  K 4.3  --   --   CL 86*  --   --   CO2 23  --   --   GLUCOSE 162*  --   --   BUN 29*  --   --   CREATININE 0.93  --   --   CALCIUM 7.5*  --   --   AST 26  --   --   ALT 14  --   --   ALKPHOS 71  --   --   BILITOT 0.5  --   --   < > =  values in this interval not displayed.  Cardiac Enzymes  Recent Labs Lab 06/25/15 1935  TROPONINI 0.04*   RADIOLOGY:  Ct Abdomen Pelvis Wo Contrast  06/25/2015   CLINICAL DATA:  Recently on IV antibiotics for high infection. Began to have diarrhea with fall today due to weakness. Positive stool culture for C difficile. Previous hysterectomy, appendectomy and cholecystectomy.  EXAM: CT CHEST, ABDOMEN AND PELVIS WITHOUT CONTRAST  TECHNIQUE: Multidetector CT imaging of the chest, abdomen and pelvis was performed following the standard protocol without IV contrast.  COMPARISON:  CT abdomen/ pelvis 10/28/2009  FINDINGS: CT CHEST FINDINGS  Lungs are adequately  inflated demonstrate moderate size bilateral pleural effusions with associated posterior bibasilar consolidation likely atelectasis. Airways are within normal.  There is no significant hilar or axillary adenopathy. There is a 1 cm right peritracheal lymph node likely reactive. There is mild calcified plaque over the thoracic aorta. Findings suggesting a stent over the left anterior descending coronary artery. There is mild cardiomegaly. Remaining mediastinal structures are unremarkable.  CT ABDOMEN AND PELVIS FINDINGS  Abdominal images demonstrate evidence of previous cholecystectomy. The liver, spleen, pancreas and adrenal glands are within normal. Kidneys demonstrate no evidence of hydronephrosis or nephrolithiasis. Ureters are within normal. Surgical absence of the appendix.  There is mild calcified plaque over the abdominal aorta and iliac arteries.  There is mild ascites present. There is mild to moderate subcutaneous edema over the flanks and abdominal wall.  Mild diverticulosis of the colon. Mild wall thickening involving the rectosigmoid colon with minimal stranding of the adjacent pericolonic fat as findings may be due to acute colitis. No evidence of perforation.  Remaining pelvic images demonstrate surgical absence of the uterus. The bladder is within normal.  There is mild spondylosis of the spine with moderate disc disease the L2-3 and L3-4 levels.  IMPRESSION: Mild wall thickening involving the rectosigmoid colon which may be due to an acute colitis. No evidence of perforation.  Mild ascites. Subcutaneous edema over the abdominal wall/flanks. Moderate size bilateral pleural effusions with associated compressive atelectasis in the lung bases.  Mild cardiomegaly.  Coronary stent.   Electronically Signed   By: Elberta Fortis M.D.   On: 06/25/2015 13:53   Ct Chest Wo Contrast  06/25/2015   CLINICAL DATA:  Recently on IV antibiotics for high infection. Began to have diarrhea with fall today due to  weakness. Positive stool culture for C difficile. Previous hysterectomy, appendectomy and cholecystectomy.  EXAM: CT CHEST, ABDOMEN AND PELVIS WITHOUT CONTRAST  TECHNIQUE: Multidetector CT imaging of the chest, abdomen and pelvis was performed following the standard protocol without IV contrast.  COMPARISON:  CT abdomen/ pelvis 10/28/2009  FINDINGS: CT CHEST FINDINGS  Lungs are adequately inflated demonstrate moderate size bilateral pleural effusions with associated posterior bibasilar consolidation likely atelectasis. Airways are within normal.  There is no significant hilar or axillary adenopathy. There is a 1 cm right peritracheal lymph node likely reactive. There is mild calcified plaque over the thoracic aorta. Findings suggesting a stent over the left anterior descending coronary artery. There is mild cardiomegaly. Remaining mediastinal structures are unremarkable.  CT ABDOMEN AND PELVIS FINDINGS  Abdominal images demonstrate evidence of previous cholecystectomy. The liver, spleen, pancreas and adrenal glands are within normal. Kidneys demonstrate no evidence of hydronephrosis or nephrolithiasis. Ureters are within normal. Surgical absence of the appendix.  There is mild calcified plaque over the abdominal aorta and iliac arteries.  There is mild ascites present. There is mild to moderate subcutaneous edema over  the flanks and abdominal wall.  Mild diverticulosis of the colon. Mild wall thickening involving the rectosigmoid colon with minimal stranding of the adjacent pericolonic fat as findings may be due to acute colitis. No evidence of perforation.  Remaining pelvic images demonstrate surgical absence of the uterus. The bladder is within normal.  There is mild spondylosis of the spine with moderate disc disease the L2-3 and L3-4 levels.  IMPRESSION: Mild wall thickening involving the rectosigmoid colon which may be due to an acute colitis. No evidence of perforation.  Mild ascites. Subcutaneous edema over  the abdominal wall/flanks. Moderate size bilateral pleural effusions with associated compressive atelectasis in the lung bases.  Mild cardiomegaly.  Coronary stent.   Electronically Signed   By: Elberta Fortis M.D.   On: 06/25/2015 13:53   ASSESSMENT AND PLAN:  81 yr old W F with multiple medical problmes got out of rehab after having treated with IV abxs for Orbital cellulitis. Presented after a fall at home and ongoing diarrhea.   1. C. Diff: Collitis: Improving clinically. No diarrhea. Leukocytosis: Sec to c.dif. Improving.  - continue IV flagyl, PO vanco and imipenem per Dr Markham Jordan, given IVIG once. Tolerated clear liquid diet and will advance to full liquid diet.   2.  Acute hyponatremia on chronic hyponatremia: likely due to SIADH, given one dose of tolvaptan, improving. Follow-up BMP.  3. DM. Continue sliding scale. Hypoglycemia: Improved.  4. A-fib. Currently stable.   5.left elbow pain after fall with abnromal xray -appreciate ortho input - no pathology  6. Acute kidney injury on chronic kidney disease stage III: improved and stable.  7. Hypertension:    continue benazepril, carvedilol, diltiazem, clonidine, furosemide, hydralazine  IV hydralazine prn.  * Pleural effusion.  from volume overload and sepsis. continue Lasix  * Ileus. Secondary to severe C. difficile colitis. Improved.  PT Evaluation.  Case discussed with Care Management/Social Worker.  CODE STATUS: full  DVT Prophylaxis: heparin  TOTAL TIME TAKING CARE OF THIS PATIENT: 42 minutes.   >50% time spent on counselling and coordination of care.  POSSIBLE D/C IN 3 DAYS, DEPENDING ON CLINICAL CONDITION.   Shaune Pollack M.D on 06/27/2015 at 11:58 AM  Between 7am to 6pm - Pager - [phone]  After 6pm go to [email or web address] - password [email or web address] [facility]  [place] [facility]  Office  [phone]  CC: Primary care physician; Mila Merry, MD

## 2015-06-27 NOTE — Progress Notes (Signed)
Surgery Progress Note  S: + flatus, + BM O:Blood pressure 139/73, pulse 74, temperature 96.3 F (35.7 C), temperature source Oral, resp. rate 15, height  (1.6 m), weight 96.616 kg (213 lb), SpO2 97 %. GEN: NAD/A&Ox3 ABD: soft, moderately distended, nontender  A/P 81 yo F with resolving c diff - no surgical issues, will sign off but feel free to call if any issues arise

## 2015-06-27 NOTE — Progress Notes (Signed)
Patient alert, tolerating eating, sodium levels q4, no diarrhea noted through out the day. Foley intact with adequate urine output. Family notified of patient being tx. PRN med given for blood pressure and effective.

## 2015-06-28 LAB — GLUCOSE, CAPILLARY
GLUCOSE-CAPILLARY: 225 mg/dL — AB (ref 65–99)
GLUCOSE-CAPILLARY: 291 mg/dL — AB (ref 65–99)
Glucose-Capillary: 379 mg/dL — ABNORMAL HIGH (ref 65–99)
Glucose-Capillary: 400 mg/dL — ABNORMAL HIGH (ref 65–99)
Glucose-Capillary: 299 mg/dL — ABNORMAL HIGH (ref 65–99)

## 2015-06-28 LAB — BASIC METABOLIC PANEL
Anion gap: 7 (ref 5–15)
BUN: 28 mg/dL — AB (ref 6–20)
CALCIUM: 7.3 mg/dL — AB (ref 8.9–10.3)
CO2: 26 mmol/L (ref 22–32)
CREATININE: 0.9 mg/dL (ref 0.44–1.00)
Chloride: 94 mmol/L — ABNORMAL LOW (ref 101–111)
GFR calc Af Amer: >60 mL/min (ref >60)
GFR, EST NON AFRICAN AMERICAN: 58 mL/min — AB (ref >60)
GLUCOSE: 276 mg/dL — AB (ref 65–99)
Potassium: 3.1 mmol/L — ABNORMAL LOW (ref 3.5–5.1)
Sodium: 127 mmol/L — ABNORMAL LOW (ref 135–145)

## 2015-06-28 LAB — CBC
HCT: 26.1 % — ABNORMAL LOW (ref 35.0–47.0)
HEMOGLOBIN: 8.7 g/dL — AB (ref 12.0–16.0)
MCH: 28.1 pg (ref 26.0–34.0)
MCHC: 33.2 g/dL (ref 32.0–36.0)
MCV: 84.6 fL (ref 80.0–100.0)
Platelets: 289 10*3/uL (ref 150–440)
RBC: 3.09 MIL/uL — ABNORMAL LOW (ref 3.80–5.20)
RDW: 14.9 % — ABNORMAL HIGH (ref 11.5–14.5)
WBC: 7.6 10*3/uL (ref 3.6–11.0)

## 2015-06-28 LAB — SODIUM
Sodium: 130 mmol/L — ABNORMAL LOW (ref 135–145)
Sodium: 129 mmol/L — ABNORMAL LOW (ref 135–145)

## 2015-06-28 LAB — MAGNESIUM: Magnesium: 1.9 mg/dL (ref 1.7–2.4)

## 2015-06-28 MED ORDER — POTASSIUM CHLORIDE CRYS ER 20 MEQ PO TBCR
60.0000 meq | EXTENDED_RELEASE_TABLET | Freq: Once | ORAL | Status: AC
  Administered 2015-06-28: 60 meq via ORAL
  Filled 2015-06-28: qty 3

## 2015-06-28 MED ORDER — INSULIN GLARGINE 100 UNIT/ML ~~LOC~~ SOLN
15.0000 [IU] | Freq: Every day | SUBCUTANEOUS | Status: DC
  Administered 2015-06-28 – 2015-07-01 (×4): 15 [IU] via SUBCUTANEOUS
  Filled 2015-06-28 (×6): qty 0.15

## 2015-06-28 MED ORDER — DILTIAZEM HCL ER COATED BEADS 180 MG PO CP24
180.0000 mg | ORAL_CAPSULE | Freq: Every day | ORAL | Status: DC
  Filled 2015-06-28: qty 1

## 2015-06-28 MED ORDER — POTASSIUM CHLORIDE CRYS ER 20 MEQ PO TBCR
20.0000 meq | EXTENDED_RELEASE_TABLET | Freq: Every day | ORAL | Status: DC
  Administered 2015-06-29 – 2015-07-07 (×9): 20 meq via ORAL
  Filled 2015-06-28 (×11): qty 1

## 2015-06-28 MED ORDER — METRONIDAZOLE 500 MG PO TABS
500.0000 mg | ORAL_TABLET | Freq: Three times a day (TID) | ORAL | Status: DC
  Administered 2015-06-28 – 2015-06-30 (×7): 500 mg via ORAL
  Filled 2015-06-28 (×7): qty 1

## 2015-06-28 MED ORDER — FAMOTIDINE 20 MG PO TABS
20.0000 mg | ORAL_TABLET | Freq: Every day | ORAL | Status: DC
  Administered 2015-06-28 – 2015-07-12 (×15): 20 mg via ORAL
  Filled 2015-06-28 (×16): qty 1

## 2015-06-28 MED ORDER — SIMETHICONE 40 MG/0.6ML PO SUSP
40.0000 mg | Freq: Four times a day (QID) | ORAL | Status: DC | PRN
  Administered 2015-06-28 (×2): 40 mg via ORAL
  Filled 2015-06-28 (×5): qty 0.6

## 2015-06-28 NOTE — Progress Notes (Signed)
[facility]  ROUNDING NOTE   Subjective:   Na 130 (126) (124) (120) (117) Transferred to floor.  Patient complaining of "gas"  Objective:  Vital signs in last 24 hours:  Temp:  [97.5 F (36.4 C)-98 F (36.7 C)] 97.5 F (36.4 C) (07/31 0743) Pulse Rate:  [61-112] 71 (07/31 0743) Resp:  [16-23] 16 (07/31 0100) BP: (145-194)/(46-80) 185/60 mmHg (07/31 0743) SpO2:  [93 %-98 %] 98 % (07/31 0743)  Weight change:  Filed Weights   06/21/15 1508  Weight: 96.616 kg (213 lb)    Intake/Output: I/O last 3 completed shifts: In: 1270 [P.O.:720; IV Piggyback:550] Out: 6350 [Urine:6350]   Intake/Output this shift:  Total I/O In: 360 [P.O.:360] Out: 200 [Urine:200]  Physical Exam: General: NAD  Head: Normocephalic, atraumatic. Moist oral mucosal membranes  Eyes: Anicteric, PERRL  Neck: Supple, trachea midline  Lungs:  Clear to auscultation  Heart: Regular rate and rhythm  Abdomen:  Soft, nontender, +distended, +bowel sounds  Extremities: 2+ peripheral and dependent edema.  Neurologic: Nonfocal, moving all four extremities  Skin: No lesions       Basic Metabolic Panel:  Recent Labs Lab 06/22/15 0408 06/23/15 0528 06/25/15 0510 06/26/15 1537  06/27/15 1309 06/27/15 1644 06/27/15 2133 06/28/15 0050 06/28/15 0459  NA 124* 122* 121* 117*  < > 126* 126* 126* 127* 130*  K 3.5  --  4.4 4.3  --  3.7  --   --  3.1*  --   CL 90*  --  90* 86*  --  92*  --   --  94*  --   CO2 23  --  24 23  --  25  --   --  26  --   GLUCOSE 122*  --  175* 162*  --  300*  --   --  276*  --   BUN 29*  --  30* 29*  --  30*  --   --  28*  --   CREATININE 1.16* 0.97 1.00 0.93  --  0.98  --   --  0.90  --   CALCIUM 8.1*  --  8.0* 7.5*  --  7.5*  --   --  7.3*  --   MG  --   --   --   --   --   --   --   --  1.9  --   < > = values in this interval not displayed.  Liver Function Tests:  Recent Labs Lab 06/21/15 1538 06/26/15 1537  AST 16 26  ALT 12* 14  ALKPHOS 89 71  BILITOT  0.4 0.5  PROT 5.3* 5.4*  ALBUMIN 2.5* 2.2*   No results for input(s): LIPASE, AMYLASE in the last 168 hours. No results for input(s): AMMONIA in the last 168 hours.  CBC:  Recent Labs Lab 06/21/15 1538  06/23/15 0528 06/25/15 0510 06/26/15 0931 06/27/15 1309 06/28/15 0050  WBC 22.5*  < > 33.9* 32.3* 15.6* 8.5 7.6  NEUTROABS 19.5*  --   --   --   --   --   --   HGB 8.1*  < > 8.9* 8.4* 8.4* 8.7* 8.7*  HCT 25.0*  < > 27.0* 26.2* 25.1* 26.7* 26.1*  MCV 85.7  < > 85.0 85.3 84.5 85.8 84.6  PLT 393  < > 452* 474* 392 316 289  < > = values in this interval not displayed.  Cardiac Enzymes:  Recent Labs Lab 06/25/15 1316 06/25/15 1935  TROPONINI 0.04* 0.04*    BNP: Invalid input(s): POCBNP  CBG:  Recent Labs Lab 06/27/15 1635 06/27/15 2128 06/28/15 0057 06/28/15 0740 06/28/15 1147  GLUCAP 240* 356* 291* 225* 379*    Microbiology: Results for orders placed or performed during the hospital encounter of 06/21/15  Stool culture     Status: None   Collection Time: 06/21/15  5:17 PM  Result Value Ref Range Status   Specimen Description STOOL  Final   Special Requests Normal  Final   Culture   Final    NO SALMONELLA OR SHIGELLA ISOLATED No Pathogenic E. coli detected NO CAMPYLOBACTER DETECTED    Report Status 06/23/2015 FINAL  Final  C difficile quick scan w PCR reflex ([facility] only)     Status: Abnormal   Collection Time: 06/21/15  5:17 PM  Result Value Ref Range Status   C Diff antigen POSITIVE (A) NEGATIVE Final   C Diff toxin POSITIVE (A) NEGATIVE Final   C Diff interpretation   Final    Positive for toxigenic C. difficile, active toxin production present.    Comment: CRITICAL RESULT CALLED TO, READ BACK BY AND VERIFIED WITH: DONALD SWEENEY ON 06/21/15 AT 1755 BY JEF   MRSA PCR Screening     Status: None   Collection Time: 06/25/15 10:48 AM  Result Value Ref Range Status   MRSA by PCR NEGATIVE NEGATIVE Final    Comment:        The GeneXpert MRSA Assay  (FDA approved for NASAL specimens only), is one component of a comprehensive MRSA colonization surveillance program. It is not intended to diagnose MRSA infection nor to guide or monitor treatment for MRSA infections.     Coagulation Studies: No results for input(s): LABPROT, INR in the last 72 hours.  Urinalysis: No results for input(s): COLORURINE, LABSPEC, PHURINE, GLUCOSEU, HGBUR, BILIRUBINUR, KETONESUR, PROTEINUR, UROBILINOGEN, NITRITE, LEUKOCYTESUR in the last 72 hours.  Invalid input(s): APPERANCEUR    Imaging: No results found.   Medications:     . acidophilus  1 capsule Oral BID  . antiseptic oral rinse  7 mL Mouth Rinse BID  . aspirin EC  81 mg Oral QHS  . atorvastatin  80 mg Oral QPM  . benazepril  40 mg Oral BID  . carvedilol  25 mg Oral BID  . [START ON 06/29/2015] diltiazem  180 mg Oral Daily  . dorzolamide  1 drop Both Eyes TID  . famotidine  20 mg Oral Q supper  . feeding supplement  1 Container Oral TID WC  . furosemide  20 mg Intravenous Q12H  . heparin subcutaneous  5,000 Units Subcutaneous 3 times per day  . hydrALAZINE  100 mg Oral TID  . hydrocortisone cream   Topical QID  . imipenem-cilastatin  500 mg Intravenous 3 times per day  . insulin aspart  0-15 Units Subcutaneous TID WC  . insulin aspart  0-5 Units Subcutaneous QHS  . latanoprost  1 drop Both Eyes QPM  . metroNIDAZOLE  500 mg Oral 3 times per day  . multivitamin with minerals  1 tablet Oral q morning - 10a  . potassium chloride  10 mEq Oral Daily  . sertraline  50 mg Oral Daily  . timolol  1 drop Both Eyes BID  . vancomycin  250 mg Oral 4 times per day   albuterol, alum & mag hydroxide-simeth, diphenhydrAMINE, enalaprilat, hydrALAZINE, labetalol, ondansetron (ZOFRAN) IV, traMADol  Assessment/ Plan:  [name] is a 81 y.o. [place] female  with diabetes mellitus type II, hypertension, congestive heart failure, hyperlipidemia, coronary artery disease, atrial fibrillation,  GERD, mitral valve disorder who was admitted to [facility] on 06/21/2015 for C. Diff colitis. Patient was found to have a serum sodium of 122. Baseline of 132 (05/03/2015).   1. Acute hyponatremia on chronic hyponatremia: hypervolemic on examination. Hypo-osmotic with measured osm of 257. Tolvaptan on 7/29 Seems that acute hyponatremia is hypervolemic while chronic hyponatremia could be due to congestive heart failure - Continue furosemie 20mg  IV, continue serial sodium checks.    2. Hypokalemia: due to IV furosemide. May contributing to GI distension.  - increase potassium chloride to 20 mEq daily. PO  3. Acute kidney injury on chronic kidney disease stage III: creatinine now back to baseline however may be dilutional from volume overload. Acute renal failure secondary to prerenal azotemia. Chronic kidney disease seems to be age related.  - monitor volume status, urine output and renal function   4. Hypertension: elevated on examination. Most likely volume driven. Elevated. - home regimen of benazepril, carvedilol, diltiazem, clonidine, furosemide, hydralazine - diltiazem dose increased. Monitor heart rate - consider starting amlodipine if still needing further control.     LOS: 7 Cheetara Hoge 7/31/201612:11 PM

## 2015-06-28 NOTE — Evaluation (Signed)
Physical Therapy ReEvaluation Patient Details Name: Katherine [name] MRN: [id number] DOB: June 29, 1932 Today's Date: 06/28/2015   History of Present Illness  Pt is an 81 y.o. female admitted to the hospital with C-diff and acute renal failure.  Pt with recent hospital stay at [facility] for eye infection and heart problems and discharged to [facility].  Pt reports fall at [facility] and also fall after discharging to home (L knee and hip imaging negative for fracture; L elbow x-ray report concerning for fracture but per ortho consult:  benign exam of elbow, no specific tenderness, unlikely to have fracture, recommend activity as tolerated).  Clinical Impression  Pt re-evaled but does not do very well with PT effort.  She is very weak, does not feel confident with any activity and ultimately is unable to even maintain sitting balance and is not willing/interested/able to try standing/walking today.      Follow Up Recommendations SNF    Equipment Recommendations       Recommendations for Other Services       Precautions / Restrictions Precautions Precautions: Fall Restrictions Weight Bearing Restrictions: No      Mobility  Bed Mobility Overal bed mobility: Needs Assistance Bed Mobility: Supine to Sit;Sit to Supine     Supine to sit: Mod assist;Max assist Sit to supine: Max assist   General bed mobility comments: Pt initially shows some effort to get up, but then becomes fatigued, has some SOB and on first attempt "crashes" back onto bed reporting feeling too tired.  On second attempt she gets to partway sitting up, but not fully and states "I just can't today, I'm too tired and too weak"  Transfers                 General transfer comment: unable/unwilling to attempt standing  Ambulation/Gait                Stairs            Wheelchair Mobility    Modified Rankin (Stroke Patients Only)       Balance Overall balance assessment: Needs assistance (pt unable to  maintain sitting at EOB balance on her own)                                           Pertinent Vitals/Pain      Home Living Family/patient expects to be discharged to:: Skilled nursing facility Living Arrangements: Spouse/significant other   Type of Home: House Home Access: Stairs to enter Entrance Stairs-Rails: Left Entrance Stairs-Number of Steps: 3 Home Layout: Able to live on main level with bedroom/bathroom Home Equipment: Walker - 2 wheels;Toilet riser      Prior Function Level of Independence: Independent with assistive device(s)         Comments: Pt using RW upon discharge from [facility] (was not using an AD prior to recent hospital admissions); takes sponge baths     Hand Dominance        Extremity/Trunk Assessment   Upper Extremity Assessment: Generalized weakness           Lower Extremity Assessment: Generalized weakness         Communication   Communication: No difficulties  Cognition Arousal/Alertness: Awake/alert Behavior During Therapy: Anxious Overall Cognitive Status: Within Functional Limits for tasks assessed  General Comments      Exercises        Assessment/Plan    PT Assessment Patient needs continued PT services  PT Diagnosis Difficulty walking;Generalized weakness   PT Problem List Decreased strength;Decreased activity tolerance;Decreased balance;Decreased mobility  PT Treatment Interventions DME instruction;Gait training;Stair training;Functional mobility training;Therapeutic activities;Therapeutic exercise;Balance training;Patient/family education   PT Goals (Current goals can be found in the Care Plan section) Acute Rehab PT Goals Patient Stated Goal: "To figure out what is going on with my bowels." PT Goal Formulation: With patient Time For Goal Achievement: 07/07/15 Potential to Achieve Goals: Fair    Frequency Min 2X/week   Barriers to discharge        Co-evaluation                End of Session   Activity Tolerance: Patient limited by fatigue Patient left: with bed alarm set           Time: 1015-1031 PT Time Calculation (min) (ACUTE ONLY): 16 min   Charges:   PT Evaluation $PT Re-evaluation: 1 Procedure     PT G Codes:       Loran Senters, PT, DPT [phone]  Malachi Pro 06/28/2015, 12:09 PM

## 2015-06-28 NOTE — Progress Notes (Signed)
[facility] - [place] at [facility]   PATIENT NAME: Katherine [name]    MR#:  [id number]  DATE OF BIRTH:  [birth year]  SUBJECTIVE:  Abdominal distention and  Weakness.  No diarrhea but no bowel movement for 2 days. REVIEW OF SYSTEMS:   Review of Systems  Constitutional: Negative for fever, chills and weight loss.  HENT: Negative for ear discharge, ear pain and nosebleeds.   Eyes: Negative for blurred vision, pain and discharge.  Respiratory: Negative for sputum production, shortness of breath, wheezing and stridor.   Cardiovascular: Positive for leg swelling. Negative for chest pain, palpitations, orthopnea and PND.  Gastrointestinal: Positive for nausea. Negative for vomiting.  Genitourinary: Negative for urgency and frequency.  Musculoskeletal: Negative for back pain and joint pain.  Neurological: Positive for weakness. Negative for sensory change, speech change and focal weakness.  Psychiatric/Behavioral: Negative for depression. The patient is not nervous/anxious.   All other systems reviewed and are negative.  Tolerating Diet: not able to eat much, very nauseous and distended abdomen Tolerating PT: eval pending DRUG ALLERGIES:   Allergies  Allergen Reactions  . Benzocaine Other (See Comments)    Unknown reaction  . Contrast Media [Iodinated Diagnostic Agents] Other (See Comments)    Tachycardia, SVT  . Other Other (See Comments)    MRI dye--reaction unknown.  Marland Kitchen Penicillin V Potassium Other (See Comments)  . Penicillins Other (See Comments)    Reaction: pt doesn't know, b/c it's been a long time ago.   . Sulfa Antibiotics Other (See Comments)    Reaction: pt doesn't know, b/c it's been a long time ago.    VITALS:  Blood pressure 185/60, pulse 71, temperature 97.5 F (36.4 C), temperature source Oral, resp. rate 16, height  (1.6 m), weight 96.616 kg (213 lb), SpO2 98 %. PHYSICAL EXAMINATION:  Physical Exam  Constitutional: She is oriented  to person, place, and time and well-developed, well-nourished, and in no distress.  HENT:  Head: Normocephalic and atraumatic.  Eyes: Conjunctivae and EOM are normal. Pupils are equal, round, and reactive to light.  Neck: Normal range of motion. Neck supple. No tracheal deviation present. No thyromegaly present.  Cardiovascular: Normal rate, regular rhythm and normal heart sounds.   Pulmonary/Chest: Effort normal and breath sounds normal. No respiratory distress. She has no wheezes. She exhibits no tenderness.  Abdominal: Soft. Bowel sounds are normal. She exhibits distension (gaseous). There is tenderness.  Musculoskeletal: Normal range of motion.  Neurological: She is alert and oriented to person, place, and time. No cranial nerve deficit.  Skin: Skin is warm and dry. No rash noted.  Psychiatric: Mood and affect normal.   LABORATORY PANEL:   CBC  Recent Labs Lab 06/28/15 0050  WBC 7.6  HGB 8.7*  HCT 26.1*  PLT 289    Chemistries   Recent Labs Lab 06/26/15 1537  06/28/15 0050 06/28/15 0459  NA 117*  < > 127* 130*  K 4.3  < > 3.1*  --   CL 86*  < > 94*  --   CO2 23  < > 26  --   GLUCOSE 162*  < > 276*  --   BUN 29*  < > 28*  --   CREATININE 0.93  < > 0.90  --   CALCIUM 7.5*  < > 7.3*  --   MG  --   --  1.9  --   AST 26  --   --   --   ALT  14  --   --   --   ALKPHOS 71  --   --   --   BILITOT 0.5  --   --   --   < > = values in this interval not displayed.  Cardiac Enzymes  Recent Labs Lab 06/25/15 1935  TROPONINI 0.04*   RADIOLOGY:  No results found. ASSESSMENT AND PLAN:  81 yr old W F with multiple medical problmes got out of rehab after having treated with IV abxs for Orbital cellulitis. Presented after a fall at home and ongoing diarrhea.   1. C. Diff: Collitis: Improved clinically. No diarrhea. Leukocytosis improved.  Change to by mouth flagyl, continue PO vanco and imipenem. Continue full liquid diet.  2.  Acute hyponatremia on chronic  hyponatremia: likely due to SIADH, given one dose of tolvaptan, improving. Follow-up BMP.  3. DM. Blood sugar is elevated. I will add Lantus and Continue sliding scale.  Hypoglycemia: Improved.  4. A-fib. Currently stable. Continue carvedilol, diltiazem.  5.left elbow pain after fall with abnromal xray -appreciate ortho input - no pathology  6. Acute kidney injury on chronic kidney disease stage III: improved and stable.  7. Hypertension:    continue benazepril, carvedilol, diltiazem, clonidine, furosemide, hydralazine  IV hydralazine prn.  * Pleural effusion.  from volume overload and sepsis. continue Lasix  * Ileus. Secondary to severe C. difficile colitis. Stable abdominal distention. Add simethicone.  PT Evaluation suggested skilled nursing facility placement.  Discussed with Dr. Wynelle Link. Case discussed with Care Management/Social Worker.  CODE STATUS: full  DVT Prophylaxis: heparin  TOTAL TIME TAKING CARE OF THIS PATIENT: 38 minutes.   >50% time spent on counselling and coordination of care.  POSSIBLE D/C IN 2-3 DAYS, DEPENDING ON CLINICAL CONDITION.   Shaune Pollack M.D on 06/28/2015 at 2:57 PM  Between 7am to 6pm - Pager - [phone]  After 6pm go to [email or web address] - password [email or web address] [facility]  [place]  [facility]  Office  [phone]  CC: Primary care physician; Mila Merry, MD

## 2015-06-29 ENCOUNTER — Inpatient Hospital Stay: Payer: Medicare Other

## 2015-06-29 LAB — GLUCOSE, CAPILLARY
GLUCOSE-CAPILLARY: 173 mg/dL — AB (ref 65–99)
Glucose-Capillary: 294 mg/dL — ABNORMAL HIGH (ref 65–99)
Glucose-Capillary: 236 mg/dL — ABNORMAL HIGH (ref 65–99)
Glucose-Capillary: 199 mg/dL — ABNORMAL HIGH (ref 65–99)

## 2015-06-29 LAB — TROPONIN I
TROPONIN I: 0.42 ng/mL — AB (ref <0.031)
TROPONIN I: 0.52 ng/mL — AB (ref <0.031)
Troponin I: 0.57 ng/mL — ABNORMAL HIGH (ref <0.031)

## 2015-06-29 LAB — BASIC METABOLIC PANEL
Anion gap: 8 (ref 5–15)
BUN: 25 mg/dL — AB (ref 6–20)
CO2: 27 mmol/L (ref 22–32)
Calcium: 7.6 mg/dL — ABNORMAL LOW (ref 8.9–10.3)
Chloride: 95 mmol/L — ABNORMAL LOW (ref 101–111)
Creatinine, Ser: 0.81 mg/dL (ref 0.44–1.00)
GLUCOSE: 193 mg/dL — AB (ref 65–99)
Potassium: 3.4 mmol/L — ABNORMAL LOW (ref 3.5–5.1)
Sodium: 130 mmol/L — ABNORMAL LOW (ref 135–145)

## 2015-06-29 LAB — SODIUM
SODIUM: 127 mmol/L — AB (ref 135–145)
Sodium: 130 mmol/L — ABNORMAL LOW (ref 135–145)

## 2015-06-29 MED ORDER — DILTIAZEM HCL 25 MG/5ML IV SOLN
10.0000 mg | Freq: Once | INTRAVENOUS | Status: AC
  Administered 2015-06-29: 10 mg via INTRAVENOUS
  Filled 2015-06-29: qty 5

## 2015-06-29 MED ORDER — DIGOXIN 0.25 MG/ML IJ SOLN
0.2500 mg | Freq: Every day | INTRAMUSCULAR | Status: DC
  Administered 2015-06-30 – 2015-07-02 (×3): 0.25 mg via INTRAVENOUS
  Filled 2015-06-29 (×3): qty 2

## 2015-06-29 MED ORDER — DILTIAZEM HCL 100 MG IV SOLR
5.0000 mg/h | INTRAVENOUS | Status: DC
  Administered 2015-06-29 – 2015-06-30 (×4): 15 mg/h via INTRAVENOUS
  Administered 2015-06-30: 5 mg/h via INTRAVENOUS
  Filled 2015-06-29 (×6): qty 100

## 2015-06-29 MED ORDER — DIGOXIN 0.25 MG/ML IJ SOLN
0.2500 mg | Freq: Once | INTRAMUSCULAR | Status: AC
  Administered 2015-06-29: 0.25 mg via INTRAVENOUS
  Filled 2015-06-29: qty 2

## 2015-06-29 MED ORDER — DILTIAZEM HCL ER COATED BEADS 240 MG PO CP24
240.0000 mg | ORAL_CAPSULE | Freq: Every day | ORAL | Status: DC
  Administered 2015-06-29 – 2015-07-07 (×9): 240 mg via ORAL
  Filled 2015-06-29 (×9): qty 1

## 2015-06-29 MED ORDER — DIGOXIN 0.25 MG/ML IJ SOLN
INTRAMUSCULAR | Status: AC
Start: 2015-06-29 — End: 2015-06-29
  Filled 2015-06-29: qty 2

## 2015-06-29 MED ORDER — DIGOXIN 0.25 MG/ML IJ SOLN
0.5000 mg | Freq: Once | INTRAMUSCULAR | Status: AC
  Administered 2015-06-29: 0.5 mg via INTRAVENOUS

## 2015-06-29 MED ORDER — MORPHINE SULFATE 2 MG/ML IJ SOLN
2.0000 mg | Freq: Once | INTRAMUSCULAR | Status: AC
  Administered 2015-06-29: 2 mg via INTRAVENOUS
  Filled 2015-06-29: qty 1

## 2015-06-29 MED ORDER — GLUCERNA SHAKE PO LIQD
237.0000 mL | Freq: Three times a day (TID) | ORAL | Status: DC
  Administered 2015-06-29 – 2015-07-03 (×11): 237 mL via ORAL

## 2015-06-29 MED ORDER — METOPROLOL TARTRATE 25 MG PO TABS
12.5000 mg | ORAL_TABLET | Freq: Four times a day (QID) | ORAL | Status: DC
  Administered 2015-06-29 – 2015-07-01 (×10): 12.5 mg via ORAL
  Administered 2015-07-02: 25 mg via ORAL
  Administered 2015-07-02 – 2015-07-08 (×23): 12.5 mg via ORAL
  Filled 2015-06-29: qty 1
  Filled 2015-06-29: qty 0.5
  Filled 2015-06-29 (×32): qty 1

## 2015-06-29 MED ORDER — METOPROLOL TARTRATE 1 MG/ML IV SOLN
5.0000 mg | Freq: Once | INTRAVENOUS | Status: AC
  Administered 2015-06-29: 5 mg via INTRAVENOUS
  Filled 2015-06-29: qty 5

## 2015-06-29 NOTE — Progress Notes (Signed)
Dr. Juliann Pares rounded on patient due to uncontrolled Heart rate.  Verbal orders obtained and placed.

## 2015-06-29 NOTE — Consult Note (Signed)
Pt with no bowel movement for 3 days, abd soft, non tender, active bowel sounds.  She has a poor appetite.   Encourage her to drink supplements.  Her WBC is now normal, it was up to 36K.  Would stop the Flagyl and continue Vancomycin for a total of 14 days after her last broad spectrum antibiotic dose. Flagyl is inferior to vancomycin in treatment of C. Diff.

## 2015-06-29 NOTE — Progress Notes (Signed)
Spoke with Dr. Clint Guy via phone about patient complaint of chest pain , abdominal pain, and left arm pain. I also let Dr.Hower know that patient's heart rate was irregular and fluctuating from the 60's to the 120's and 130's and then back down, also she  complained that her throat felt like it was swelling. New orders for EKG and morphine once were given.  Katherine [name]

## 2015-06-29 NOTE — Progress Notes (Signed)
Called and spoke with Kipp Brood , RN let Kipp Brood know patient was being transferred to ccu room 10 due to elevated heartrate. Anselm Jungling

## 2015-06-29 NOTE — Progress Notes (Signed)
Dr. Gwen Pounds notified of uncontrolled heart rate and elevated troponin. Telephone orders received.

## 2015-06-29 NOTE — Progress Notes (Signed)
[facility] - [place] at [facility]   PATIENT NAME: Katherine [name]    MR#:  [id number]  DATE OF BIRTH:  1932-06-14  SUBJECTIVE:  Chest pain last night. Noticed Afib with RVR at 130-150. Given cardizem iv one dose. Abdominal gas and distention, Weakness.  No diarrhea but no bowel movement for 3 days. REVIEW OF SYSTEMS:   Review of Systems  Constitutional: Negative for fever, chills and weight loss.  HENT: Negative for ear discharge, ear pain and nosebleeds.   Eyes: Negative for blurred vision, pain and discharge.  Respiratory: Negative for sputum production, shortness of breath, wheezing and stridor.   Cardiovascular: Positive for leg swelling. Negative for chest pain, palpitations, orthopnea and PND.  Gastrointestinal: Positive for nausea. Negative for vomiting.  Genitourinary: Negative for urgency and frequency.  Musculoskeletal: Negative for back pain and joint pain.  Neurological: Positive for weakness. Negative for sensory change, speech change and focal weakness.  Psychiatric/Behavioral: Negative for depression. The patient is not nervous/anxious.   All other systems reviewed and are negative.  Tolerating Diet: not able to eat much, very nauseous and distended abdomen Tolerating PT: eval pending DRUG ALLERGIES:   Allergies  Allergen Reactions  . Benzocaine Other (See Comments)    Unknown reaction  . Contrast Media [Iodinated Diagnostic Agents] Other (See Comments)    Tachycardia, SVT  . Other Other (See Comments)    MRI dye--reaction unknown.  Marland Kitchen Penicillin V Potassium Other (See Comments)  . Penicillins Other (See Comments)    Reaction: pt doesn't know, b/c it's been a long time ago.   . Sulfa Antibiotics Other (See Comments)    Reaction: pt doesn't know, b/c it's been a long time ago.    VITALS:  Blood pressure 147/76, pulse 144, temperature 97.8 F (36.6 C), temperature source Oral, resp. rate 16, height  (1.6 m), weight 96.616 kg (213  lb), SpO2 99 %. PHYSICAL EXAMINATION:  Physical Exam  Constitutional: She is oriented to person, place, and time and well-developed, well-nourished, and in no distress.  HENT:  Head: Normocephalic and atraumatic.  Eyes: Conjunctivae and EOM are normal. Pupils are equal, round, and reactive to light.  Neck: Normal range of motion. Neck supple. No tracheal deviation present. No thyromegaly present.  Cardiovascular: Normal rate, regular rhythm and normal heart sounds.   Pulmonary/Chest: Effort normal and breath sounds normal. No respiratory distress. She has no wheezes. She exhibits no tenderness.  Abdominal: Soft. Bowel sounds are normal. She exhibits distension (gaseous). There is tenderness.  Musculoskeletal: Normal range of motion.  Neurological: She is alert and oriented to person, place, and time. No cranial nerve deficit.  Skin: Skin is warm and dry. No rash noted.  Psychiatric: Mood and affect normal.   LABORATORY PANEL:   CBC  Recent Labs Lab 06/28/15 0050  WBC 7.6  HGB 8.7*  HCT 26.1*  PLT 289    Chemistries   Recent Labs Lab 06/26/15 1537  06/28/15 0050  06/29/15 0705  NA 117*  < > 127*  < > 130*  K 4.3  < > 3.1*  --  3.4*  CL 86*  < > 94*  --  95*  CO2 23  < > 26  --  27  GLUCOSE 162*  < > 276*  --  193*  BUN 29*  < > 28*  --  25*  CREATININE 0.93  < > 0.90  --  0.81  CALCIUM 7.5*  < > 7.3*  --  7.6*  MG  --   --  1.9  --   --   AST 26  --   --   --   --   ALT 14  --   --   --   --   ALKPHOS 71  --   --   --   --   BILITOT 0.5  --   --   --   --   < > = values in this interval not displayed.  Cardiac Enzymes  Recent Labs Lab 06/25/15 1935  TROPONINI 0.04*   RADIOLOGY:  No results found. ASSESSMENT AND PLAN:  81 yr old W F with multiple medical problmes got out of rehab after having treated with IV abxs for Orbital cellulitis. Presented after a fall at home and ongoing diarrhea.   1. C. Diff: Collitis: Improved clinically. No diarrhea.  Leukocytosis improved.  Changed to by mouth flagyl, continue PO vanco and imipenem. Advance to ADA diet.  2.  Acute hyponatremia on chronic hyponatremia: likely due to SIADH, given one dose of tolvaptan, improving. Follow-up BMP.  3. DM. Blood sugar is better, continue Lantus and Continue sliding scale.  Hypoglycemia: Improved.  4. A-fib with RVR. Start carddizem drip, Continue ASA,  carvedilol, discontinue po diltiazem. Cardiology consult.  5.left elbow pain after fall with abnromal xray. Improved. -appreciate ortho input - no pathology  6. Acute kidney injury on chronic kidney disease stage III: improved and stable.  7. Hypertension:    continue benazepril, carvedilol, diltiazem, clonidine, furosemide, hydralazine  IV hydralazine prn.  * Pleural effusion.  from volume overload and sepsis. continue Lasix  * Ileus. Secondary to severe C. difficile colitis.  abdominal distention. Added simethicone. abd xray.  PT Evaluation suggested skilled nursing facility placement.  Discussed with Dr. Wynelle Link. Case discussed with Care Management/Social Worker.  CODE STATUS: full  DVT Prophylaxis: heparin  TOTAL CRITICAL TIME TAKING CARE OF THIS PATIENT: 46 minutes.   >50% time spent on counselling and coordination of care.  POSSIBLE D/C IN 2-3 DAYS, DEPENDING ON CLINICAL CONDITION.   Shaune Pollack M.D on 06/29/2015 at 8:50 AM  Between 7am to 6pm - Pager - [phone]  After 6pm go to [email or web address] - password [email or web address] [email or web address]  [place] [facility]  Office  [phone]  CC: Primary care physician; Mila Merry, MD

## 2015-06-29 NOTE — Clinical Documentation Improvement (Signed)
Please identify the acuity (acute, chronic, acute on chronic) and type (diastolic, systolic, combined diastolic and systolic) of CHF present this admission and document in your future progress notes and discharge summary.  (History of CHF documented in past medical history grid; furosemide ordered this admission.)  Thank you, Doy Mince, RN [phone] Clinical Documentation Specialist

## 2015-06-29 NOTE — Progress Notes (Signed)
Dr. Imogene Burn notified of elevated troponin and uncontrolled heart rate.  Nurse asked to notify cardiologist.

## 2015-06-29 NOTE — Progress Notes (Signed)
Nutrition Follow-up    INTERVENTION:  Medical Food Supplement Therapy: pt drinks Glucerna at home; recommend changing supplement to Glucerna TID, likes butter pecan Meals and Snacks: Cater to patient preferences; pt reports many food intolerances/allergies; documented in chart the ones that pt reports to writer at this time. Encourage menu completion/participation in room service to best meet pt preferences  NUTRITION DIAGNOSIS:   Inadequate oral intake related to inability to eat, altered GI function, acute illness as evidenced by NPO status. Improving as diet advanced, supplements   GOAL:   Patient will meet greater than or equal to 90% of their needs   MONITOR:    (Energy Intake: diet progrsesion, Digestive System, Electrolyte/Renal Profile, Glucose Profile)  REASON FOR ASSESSMENT:   Consult Assessment of nutrition requirement/status  ASSESSMENT:     Diet Order:  Diet heart healthy/carb modified Room service appropriate?: Yes; Fluid consistency:: Thin   Energy Intake: pt reports appetite improving, did not eat much breakfast this AM because she did not like/does not eat what was provided on meal tray  Digestive System: +flatus (reports chronic gas), +nausea at times, no vomitting; abdominal distention improving  Skin:  Reviewed, no issues  Electrolyte and Renal Profile:  Recent Labs Lab 06/27/15 1309  06/28/15 0050  06/28/15 2043 06/29/15 0705 06/29/15 0923  BUN 30*  --  28*  --   --  25*  --   CREATININE 0.98  --  0.90  --   --  0.81  --   NA 126*  < > 127*  < > 129* 130* 130*  K 3.7  --  3.1*  --   --  3.4*  --   MG  --   --  1.9  --   --   --   --   < > = values in this interval not displayed. Glucose Profile:  Recent Labs  06/28/15 2149 06/29/15 0800 06/29/15 1231  GLUCAP 299* 173* 294*   Protein Profile:  Recent Labs Lab 06/26/15 1537  ALBUMIN 2.2*   Meds: acidophilus, lasix, ss novolog, lantus, MVI, potassium chloride  Height:   Ht  Readings from Last 1 Encounters:  06/21/15  (1.6 m)    Weight:   Wt Readings from Last 1 Encounters:  06/21/15 213 lb (96.616 kg)     BMI:  Body mass index is 37.74 kg/(m^2).  Estimated Nutritional Needs:   Kcal:  1744-1889 kcals (BEE 1118, 1.3 AF, 1.2-1.3 IF) using IBW 52.3 kg  Protein:  62-73 g (1.2-1.4 g/kg)   Fluid:  1560-1820 mL (30-35 ml/kg)   MODERATE Care Level  Romelle Starcher MS, RD, LDN [phone] Pager

## 2015-06-29 NOTE — Progress Notes (Signed)
PT Cancellation Note  Patient Details Name: Katherine [name] MRN: [id number] DOB: 1932-06-24   Cancelled Treatment:    Reason Eval/Treat Not Completed: Medical issues which prohibited therapy (Patient noted with transfer to CCU due to a-fib with RVR; currently rates in 120-140s.  PT services contraindicated at this time.  Will require new orders to resume PT services as medically appropriate due to transfer to higher-level.  RN informed/aware.)  Tommy Rainwater. Manson Passey, PT, DPT, NCS 06/29/2015, 10:53 AM [phone]

## 2015-06-29 NOTE — Progress Notes (Signed)
[facility]  ROUNDING NOTE   Subjective:   Sodium remains stable.  Transferred to ICU due to a-fib with RVR Patient states she is anxious  Objective:  Vital signs in last 24 hours:  Temp:  [97.7 F (36.5 C)-97.8 F (36.6 C)] 97.8 F (36.6 C) (08/01 0743) Pulse Rate:  [52-144] 144 (08/01 0743) Resp:  [16] 16 (07/31 1535) BP: (107-156)/(43-76) 147/76 mmHg (08/01 0743) SpO2:  [96 %-99 %] 99 % (08/01 0743)  Weight change:  Filed Weights   06/21/15 1508  Weight: 96.616 kg (213 lb)    Intake/Output: I/O last 3 completed shifts: In: 840 [P.O.:460; IV Piggyback:380] Out: 3500 [Urine:3500]   Intake/Output this shift:     Physical Exam: General: NAD  Head: Normocephalic, atraumatic. Moist oral mucosal membranes  Eyes: Anicteric, PERRL  Neck: Supple, trachea midline  Lungs:  Clear to auscultation  Heart: Irregular, tachy  Abdomen:  Soft, nontender, +distended, +bowel sounds  Extremities: 1+ peripheral and dependent edema.  Neurologic: Nonfocal, moving all four extremities  Skin: No lesions       Basic Metabolic Panel:  Recent Labs Lab 06/25/15 0510 06/26/15 1537  06/27/15 1309  06/27/15 2133 06/28/15 0050 06/28/15 0459 06/28/15 2043 06/29/15 0705  NA 121* 117*  < > 126*  < > 126* 127* 130* 129* 130*  K 4.4 4.3  --  3.7  --   --  3.1*  --   --  3.4*  CL 90* 86*  --  92*  --   --  94*  --   --  95*  CO2 24 23  --  25  --   --  26  --   --  27  GLUCOSE 175* 162*  --  300*  --   --  276*  --   --  193*  BUN 30* 29*  --  30*  --   --  28*  --   --  25*  CREATININE 1.00 0.93  --  0.98  --   --  0.90  --   --  0.81  CALCIUM 8.0* 7.5*  --  7.5*  --   --  7.3*  --   --  7.6*  MG  --   --   --   --   --   --  1.9  --   --   --   < > = values in this interval not displayed.  Liver Function Tests:  Recent Labs Lab 06/26/15 1537  AST 26  ALT 14  ALKPHOS 71  BILITOT 0.5  PROT 5.4*  ALBUMIN 2.2*   No results for input(s): LIPASE, AMYLASE in the  last 168 hours. No results for input(s): AMMONIA in the last 168 hours.  CBC:  Recent Labs Lab 06/23/15 0528 06/25/15 0510 06/26/15 0931 06/27/15 1309 06/28/15 0050  WBC 33.9* 32.3* 15.6* 8.5 7.6  HGB 8.9* 8.4* 8.4* 8.7* 8.7*  HCT 27.0* 26.2* 25.1* 26.7* 26.1*  MCV 85.0 85.3 84.5 85.8 84.6  PLT 452* 474* 392 316 289    Cardiac Enzymes:  Recent Labs Lab 06/25/15 1316 06/25/15 1935  TROPONINI 0.04* 0.04*    BNP: Invalid input(s): POCBNP  CBG:  Recent Labs Lab 06/28/15 0740 06/28/15 1147 06/28/15 1644 06/28/15 2149 06/29/15 0800  GLUCAP 225* 379* 400* 299* 173*    Microbiology: Results for orders placed or performed during the hospital encounter of 06/21/15  Stool culture     Status: None   Collection Time:  06/21/15  5:17 PM  Result Value Ref Range Status   Specimen Description STOOL  Final   Special Requests Normal  Final   Culture   Final    NO SALMONELLA OR SHIGELLA ISOLATED No Pathogenic E. coli detected NO CAMPYLOBACTER DETECTED    Report Status 06/23/2015 FINAL  Final  C difficile quick scan w PCR reflex ([facility] only)     Status: Abnormal   Collection Time: 06/21/15  5:17 PM  Result Value Ref Range Status   C Diff antigen POSITIVE (A) NEGATIVE Final   C Diff toxin POSITIVE (A) NEGATIVE Final   C Diff interpretation   Final    Positive for toxigenic C. difficile, active toxin production present.    Comment: CRITICAL RESULT CALLED TO, READ BACK BY AND VERIFIED WITH: DONALD SWEENEY ON 06/21/15 AT 1755 BY JEF   MRSA PCR Screening     Status: None   Collection Time: 06/25/15 10:48 AM  Result Value Ref Range Status   MRSA by PCR NEGATIVE NEGATIVE Final    Comment:        The GeneXpert MRSA Assay (FDA approved for NASAL specimens only), is one component of a comprehensive MRSA colonization surveillance program. It is not intended to diagnose MRSA infection nor to guide or monitor treatment for MRSA infections.     Coagulation Studies: No  results for input(s): LABPROT, INR in the last 72 hours.  Urinalysis: No results for input(s): COLORURINE, LABSPEC, PHURINE, GLUCOSEU, HGBUR, BILIRUBINUR, KETONESUR, PROTEINUR, UROBILINOGEN, NITRITE, LEUKOCYTESUR in the last 72 hours.  Invalid input(s): APPERANCEUR    Imaging: No results found.   Medications:     . acidophilus  1 capsule Oral BID  . antiseptic oral rinse  7 mL Mouth Rinse BID  . aspirin EC  81 mg Oral QHS  . atorvastatin  80 mg Oral QPM  . benazepril  40 mg Oral BID  . carvedilol  25 mg Oral BID  . diltiazem  180 mg Oral Daily  . dorzolamide  1 drop Both Eyes TID  . famotidine  20 mg Oral Q supper  . feeding supplement  1 Container Oral TID WC  . furosemide  20 mg Intravenous Q12H  . heparin subcutaneous  5,000 Units Subcutaneous 3 times per day  . hydrALAZINE  100 mg Oral TID  . hydrocortisone cream   Topical QID  . insulin aspart  0-15 Units Subcutaneous TID WC  . insulin aspart  0-5 Units Subcutaneous QHS  . insulin glargine  15 Units Subcutaneous QHS  . latanoprost  1 drop Both Eyes QPM  . metroNIDAZOLE  500 mg Oral 3 times per day  . multivitamin with minerals  1 tablet Oral q morning - 10a  . potassium chloride  20 mEq Oral Daily  . sertraline  50 mg Oral Daily  . timolol  1 drop Both Eyes BID  . vancomycin  250 mg Oral 4 times per day   albuterol, alum & mag hydroxide-simeth, diphenhydrAMINE, enalaprilat, hydrALAZINE, labetalol, ondansetron (ZOFRAN) IV, simethicone, traMADol  Assessment/ Plan:  Katherine [name] is a 81 y.o. Katherine [name] female with diabetes mellitus type II, hypertension, congestive heart failure, hyperlipidemia, coronary artery disease, atrial fibrillation, GERD, mitral valve disorder who was admitted to [facility] on 06/21/2015 for C. Diff colitis. Patient was found to have a serum sodium of 122. Baseline of 132 (05/03/2015).   1. Acute hyponatremia on chronic hyponatremia: hypervolemic on examination. Hypo-osmotic with measured osm of  257. Tolvaptan on 7/29 Seems  that acute hyponatremia is hypervolemic while chronic hyponatremia could be due to congestive heart failure - Continue furosemie  IV, continue serial sodium checks.    2. Hypokalemia: due to IV furosemide. May contributing to GI distension.  - potassium chloride to 20 mEq daily. PO  3. Acute kidney injury on chronic kidney disease stage III: creatinine now back to baseline. GFR greater than 60.  however may be dilutional from volume overload. Acute renal failure secondary to prerenal azotemia. Chronic kidney disease seems to be age related.  - monitor volume status, urine output and renal function   4. Hypertension with atrial fibrillation with RVR:  - home regimen of benazepril, carvedilol, diltiazem, clonidine, furosemide, hydralazine - diltiazem dose increased yesterday. Monitor heart rate. May need diltiazem gtt.  - if further blood pressure control required, recommend amlodipine.     LOS: 8 Katherine [name] 8/1/20168:48 AM

## 2015-06-29 NOTE — Progress Notes (Signed)
Patient accepted from room 205 for a-fib rvr.  Dr. Imogene Burn at bedside and updated on patients current status.  Information acknowledged and per Dr. Imogene Burn he will place appropriate orders.

## 2015-06-29 NOTE — Clinical Documentation Improvement (Signed)
Please clarify if sepsis currently documented in chart 7/28 forward was present on admission or developed after admission; please document in your future progress notes and discharge summary.    Thank you, Doy Mince, RN [phone] Clinical Documentation Specialist

## 2015-06-29 NOTE — Progress Notes (Signed)
Inpatient Diabetes Program Recommendations  AACE/ADA: New Consensus Statement on Inpatient Glycemic Control (2013)  Target Ranges:  Prepandial:   less than 140 mg/dL      Peak postprandial:   less than 180 mg/dL (1-2 hours)      Critically ill patients:  140 - 180 mg/dL  Results for WHITEGreysen, [place] (MRN [id number]) as of 06/29/2015 11:19  Ref. Range 06/28/2015 07:40 06/28/2015 11:47 06/28/2015 16:44 06/28/2015 21:49 06/29/2015 08:00  Glucose-Capillary Latest Ref Range: 65-99 mg/dL 086 (H) 578 (H) 469 (H) 299 (H) 173 (H)    Inpatient Diabetes Program Recommendations Insulin - Basal: increase Lantus to 25 units  Correction (SSI): --- Poor glycemic control over the last few days.  Consider above recommendation.  Thank you  Piedad Climes BSN, RN,CDE Inpatient Diabetes Coordinator [phone] (team pager)

## 2015-06-29 NOTE — Progress Notes (Signed)
Patient with second elevated troponin and uncontrolled heart rate. Telephone orders received.

## 2015-06-29 NOTE — Progress Notes (Signed)
Clinical Child psychotherapist (CSW) contacted [employer] at [employer] today. Per Gala Romney patient still has a bed at [facility] when she is medially stable. Plan is for patient to D/C to [facility] when stable. CSW will continue to follow and assist as needed.   Jetta Lout, LCSWA [phone]

## 2015-06-30 LAB — BASIC METABOLIC PANEL
Anion gap: 7 (ref 5–15)
BUN: 28 mg/dL — AB (ref 6–20)
CO2: 26 mmol/L (ref 22–32)
Calcium: 7.5 mg/dL — ABNORMAL LOW (ref 8.9–10.3)
Chloride: 94 mmol/L — ABNORMAL LOW (ref 101–111)
Creatinine, Ser: 0.88 mg/dL (ref 0.44–1.00)
GFR, EST NON AFRICAN AMERICAN: 60 mL/min — AB (ref >60)
Glucose, Bld: 212 mg/dL — ABNORMAL HIGH (ref 65–99)
POTASSIUM: 3.9 mmol/L (ref 3.5–5.1)
SODIUM: 127 mmol/L — AB (ref 135–145)

## 2015-06-30 LAB — CBC
HEMATOCRIT: 27.3 % — AB (ref 35.0–47.0)
HEMOGLOBIN: 8.8 g/dL — AB (ref 12.0–16.0)
MCH: 27.6 pg (ref 26.0–34.0)
MCHC: 32 g/dL (ref 32.0–36.0)
MCV: 86.1 fL (ref 80.0–100.0)
Platelets: 246 10*3/uL (ref 150–440)
RBC: 3.18 MIL/uL — ABNORMAL LOW (ref 3.80–5.20)
RDW: 15.2 % — AB (ref 11.5–14.5)
WBC: 12.4 10*3/uL — ABNORMAL HIGH (ref 3.6–11.0)

## 2015-06-30 LAB — GLUCOSE, CAPILLARY
GLUCOSE-CAPILLARY: 217 mg/dL — AB (ref 65–99)
Glucose-Capillary: 189 mg/dL — ABNORMAL HIGH (ref 65–99)
Glucose-Capillary: 163 mg/dL — ABNORMAL HIGH (ref 65–99)
Glucose-Capillary: 208 mg/dL — ABNORMAL HIGH (ref 65–99)

## 2015-06-30 LAB — SODIUM
SODIUM: 126 mmol/L — AB (ref 135–145)
Sodium: 128 mmol/L — ABNORMAL LOW (ref 135–145)

## 2015-06-30 MED ORDER — MORPHINE SULFATE 2 MG/ML IJ SOLN
2.0000 mg | INTRAMUSCULAR | Status: DC | PRN
  Administered 2015-06-30 – 2015-07-06 (×10): 2 mg via INTRAVENOUS
  Filled 2015-06-30 (×10): qty 1

## 2015-06-30 MED ORDER — ASPIRIN 81 MG PO CHEW
81.0000 mg | CHEWABLE_TABLET | Freq: Every day | ORAL | Status: DC
  Administered 2015-07-01 – 2015-07-13 (×13): 81 mg via ORAL
  Filled 2015-06-30 (×13): qty 1

## 2015-06-30 MED ORDER — HEPARIN SODIUM (PORCINE) 5000 UNIT/ML IJ SOLN
5000.0000 [IU] | Freq: Three times a day (TID) | INTRAMUSCULAR | Status: DC
  Administered 2015-06-30 – 2015-07-08 (×25): 5000 [IU] via SUBCUTANEOUS
  Filled 2015-06-30 (×24): qty 1

## 2015-06-30 MED ORDER — HYDROCORTISONE ACETATE 25 MG RE SUPP
25.0000 mg | Freq: Two times a day (BID) | RECTAL | Status: DC
  Administered 2015-06-30 – 2015-07-13 (×18): 25 mg via RECTAL
  Filled 2015-06-30 (×22): qty 1

## 2015-06-30 NOTE — Progress Notes (Addendum)
[facility] [place] at [facility]   PATIENT NAME: Katherine [name]    MR#:  [id number]  DATE OF BIRTH:  [birth year]  SUBJECTIVE:  Bloody stool just now. Still Abdominal gas and distention, Weakness. REVIEW OF SYSTEMS:   Review of Systems  Constitutional: Negative for fever, chills and weight loss.  HENT: Negative for ear discharge, ear pain and nosebleeds.   Eyes: Negative for blurred vision, pain and discharge.  Respiratory: Negative for sputum production, shortness of breath, wheezing and stridor.   Cardiovascular: Positive for leg swelling. Negative for chest pain, palpitations, orthopnea and PND.  Gastrointestinal: Positive for nausea and blood in stool. Negative for vomiting.  Genitourinary: Negative for urgency and frequency.  Musculoskeletal: Negative for back pain and joint pain.  Neurological: Positive for weakness. Negative for sensory change, speech change and focal weakness.  Psychiatric/Behavioral: Negative for depression. The patient is not nervous/anxious.   All other systems reviewed and are negative.  Tolerating Diet: not able to eat much, very nauseous and distended abdomen Tolerating PT: eval pending DRUG ALLERGIES:   Allergies  Allergen Reactions  . Benzocaine Other (See Comments)    Unknown reaction  . Contrast Media [Iodinated Diagnostic Agents] Other (See Comments)    Tachycardia, SVT  . Other Other (See Comments)    MRI dye--reaction unknown.  Marland Kitchen Penicillin V Potassium Other (See Comments)  . Penicillins Other (See Comments)    Reaction: pt doesn't know, b/c it's been a long time ago.   . Sulfa Antibiotics Other (See Comments)    Reaction: pt doesn't know, b/c it's been a long time ago.    VITALS:  Blood pressure 145/81, pulse 101, temperature 97.8 F (36.6 C), temperature source Oral, resp. rate 18, height  (1.6 m), weight 96.616 kg (213 lb), SpO2 98 %. PHYSICAL EXAMINATION:  Physical Exam  Constitutional: She is  oriented to person, place, and time and well-developed, well-nourished, and in no distress.  HENT:  Head: Normocephalic and atraumatic.  Eyes: Conjunctivae and EOM are normal. Pupils are equal, round, and reactive to light.  Neck: Normal range of motion. Neck supple. No tracheal deviation present. No thyromegaly present.  Cardiovascular: Normal rate, regular rhythm and normal heart sounds.   Pulmonary/Chest: Effort normal and breath sounds normal. No respiratory distress. She has no wheezes. She exhibits no tenderness.  Abdominal: Soft. Bowel sounds are normal. She exhibits distension (gaseous). There is tenderness.  Musculoskeletal: Normal range of motion. She exhibits edema.  Neurological: She is alert and oriented to person, place, and time. No cranial nerve deficit.  Skin: Skin is warm and dry. No rash noted.  Psychiatric: Mood and affect normal.   LABORATORY PANEL:   CBC  Recent Labs Lab 06/30/15 0543  WBC 12.4*  HGB 8.8*  HCT 27.3*  PLT 246    Chemistries   Recent Labs Lab 06/26/15 1537  06/28/15 0050  06/30/15 0543 06/30/15 0707  NA 117*  < > 127*  < > 127* 128*  K 4.3  < > 3.1*  < > 3.9  --   CL 86*  < > 94*  < > 94*  --   CO2 23  < > 26  < > 26  --   GLUCOSE 162*  < > 276*  < > 212*  --   BUN 29*  < > 28*  < > 28*  --   CREATININE 0.93  < > 0.90  < > 0.88  --   CALCIUM 7.5*  < >  7.3*  < > 7.5*  --   MG  --   --  1.9  --   --   --   AST 26  --   --   --   --   --   ALT 14  --   --   --   --   --   ALKPHOS 71  --   --   --   --   --   BILITOT 0.5  --   --   --   --   --   < > = values in this interval not displayed.  Cardiac Enzymes  Recent Labs Lab 06/29/15 2038  TROPONINI 0.57*   RADIOLOGY:  Dg Abd Acute W/chest  06/29/2015   CLINICAL DATA:  Chest pain. History of C difficile. Abdominal distention with gas and bloating.  EXAM: DG ABDOMEN ACUTE W/ 1V CHEST  COMPARISON:  Multiple priors.  FINDINGS: Cardiomegaly persists. Mild vascular congestion is  stable. Moderate-sized BILATERAL pleural effusions with LEFT greater than RIGHT lower lobe atelectasis, roughly stable from prior CT.  Gaseous distention of small and large bowel is redemonstrated without definite obstruction visible free air. Enteric contrast from prior CT is concentrated in the RIGHT colon. No acute osseous findings.  IMPRESSION: Cardiomegaly with mild vascular congestion, BILATERAL pleural effusions, and lower lobe atelectasis, roughly stable.  Gaseous distention of small and large bowel without definite obstruction or free air ; suspect persistent ileus.   Electronically Signed   By: Elsie Stain M.D.   On: 06/29/2015 11:44   ASSESSMENT AND PLAN:  81 yr old W F with multiple medical problmes got out of rehab after having treated with IV abxs for Orbital cellulitis. Presented after a fall at home and ongoing diarrhea.   1. C. Diff: Collitis: Improved clinically. No diarrhea. Leukocytosis improved.  continue flagyl, continue PO vanco and imipenem.  * GIB: NPO, hold ASA, heparin SQ, GI consult. F/u CBC.  2.  Acute hyponatremia on chronic hyponatremia: likely due to SIADH, given one dose of tolvaptan, improving but worse today. Follow-up BMP.  3. DM. Blood sugar is better, continue Lantus and Continue sliding scale.  Hypoglycemia: Improved.  4. A-fib with RVR.  Try to wean off carddizem drip, hold ASA,  Continue carvedilol, diltiazem and lopreesor per Dr. Juliann Pares.  5.left elbow pain after fall with abnromal xray. Improved. -appreciate ortho input - no pathology  6. Acute kidney injury on chronic kidney disease stage III: improved and stable.  7. Hypertension:    continue benazepril, carvedilol, diltiazem, lopressor, clonidine, furosemide, hydralazine  IV hydralazine prn.  * Pleural effusion.  from volume overload and sepsis. continue Lasix * Chronic systolic CHF. Continue lasix and  Benazepril.  * Ileus. Secondary to severe C. difficile colitis.  abdominal  distention. NPO, f/u GI.  PT Evaluation suggested skilled nursing facility placement.  Discussed with Dr. Wynelle Link and Dr. Juliann Pares. Case discussed with Care Management/Social Worker.  CODE STATUS: full  DVT Prophylaxis: heparin  TOTAL CRITICAL TIME TAKING CARE OF THIS PATIENT: 52 minutes.   >50% time spent on counselling and coordination of care.  POSSIBLE D/C IN 3-4 DAYS, DEPENDING ON CLINICAL CONDITION.   Shaune Pollack M.D on 06/30/2015 at 8:44 AM  Between 7am to 6pm - Pager - [phone]  After 6pm go to [email or web address] - password [email or web address] [facility]  [place]  Office  [phone]  CC: Primary care physician; Mila Merry, MD

## 2015-06-30 NOTE — Progress Notes (Signed)
Subjective:  Patient is still having diarrhea denies significant chest pain still has palpitations and tachycardia complains of weakness shortness of breath and fatigue. The patient complains of bloody stools  Objective:  Vital Signs in the last 24 hours: Temp:  [97.5 F (36.4 C)-98 F (36.7 C)] 98 F (36.7 C) (08/02 1200) Pulse Rate:  [34-128] 69 (08/02 1400) Resp:  [15-24] 15 (08/02 1400) BP: (98-156)/(49-139) 132/49 mmHg (08/02 1400) SpO2:  [95 %-99 %] 97 % (08/02 1400)  Intake/Output from previous day: 08/01 0701 - 08/02 0700 In: 1278.5 [P.O.:960; I.V.:318.5] Out: 925 [Urine:925] Intake/Output from this shift: Total I/O In: 180 [P.O.:90; I.V.:90] Out: -   Physical Exam: General appearance: alert, cooperative, appears stated age, fatigued and moderately obese Neck: no adenopathy, no carotid bruit, no JVD, supple, symmetrical, trachea midline and thyroid not enlarged, symmetric, no tenderness/mass/nodules Lungs: clear to auscultation bilaterally and normal percussion bilaterally Heart: irregularly irregular rhythm Abdomen: soft, non-tender; bowel sounds normal; no masses,  no organomegaly Extremities: extremities normal, atraumatic, no cyanosis or edema Pulses: 2+ and symmetric Skin: Skin color, texture, turgor normal. No rashes or lesions Neurologic: Alert and oriented X 3, normal strength and tone. Normal symmetric reflexes. Normal coordination and gait  Lab Results:  Recent Labs  06/28/15 0050 06/30/15 0543  WBC 7.6 12.4*  HGB 8.7* 8.8*  PLT 289 246    Recent Labs  06/29/15 0705  06/30/15 0543 06/30/15 0707  NA 130*  < > 127* 128*  K 3.4*  --  3.9  --   CL 95*  --  94*  --   CO2 27  --  26  --   GLUCOSE 193*  --  212*  --   BUN 25*  --  28*  --   CREATININE 0.81  --  0.88  --   < > = values in this interval not displayed.  Recent Labs  06/29/15 1446 06/29/15 2038  TROPONINI 0.52* 0.57*   Hepatic Function Panel No results for input(s): PROT,  ALBUMIN, AST, ALT, ALKPHOS, BILITOT, BILIDIR, IBILI in the last 72 hours. No results for input(s): CHOL in the last 72 hours. No results for input(s): PROTIME in the last 72 hours.  Imaging: Imaging results have been reviewed  Cardiac Studies:  Assessment/Plan:  Arrhythmia Atrial Fibrillation Coronary Artery Disease Coronary Artery Stent Edema Ischemic Heart Disease Palpitations Shortness of Breath   C difficile colitis  abnormal EKG  abdominal discomfort  GERD with reflux  diabetes Malaise and fatigue . PLAN  agree with Cardizem IV for rate control  continue p.o. Cardizem  maintain p.o. Digoxin  continue low-dose beta-blockers  recommend GI evaluation for bloody stools  continue C diff therapy  continue diarrhea treatment  maintain diabetes therapy with insulin  continue rate control for atrial fibrillation  hypertension control with hydralazine,  Benazepril, metoprolol  maintained antibiotic therapy for C diff  Correct hyponatremia  To help with symptoms  lipid management with Lipitor as necessary  continue medical therapy   LOS: 9 days    Osualdo Hansell D. 06/30/2015, 2:50 PM

## 2015-06-30 NOTE — Consult Note (Signed)
Patient had some BRBPR and nurse notified me.  I did a rectal exam and it showed a ring of hemorrhoids after the exam some liquid brown dark stool came out with only a spot of blood.  I do not think this is anything but hemorrhoids.  Her hgb is essentially unchanged.  Her abd has some distention but better bowel sounds.  i recommend advance to full liquid diet as she is hungry.

## 2015-06-30 NOTE — Care Management Important Message (Signed)
Important Message  Patient Details  Name: FIONNUALA HEMMERICH MRN: [id number] Date of Birth: 1932-01-07   Medicare Important Message Given:  N/A - LOS <3 / Initial given by admissions    Collie Siad, RN 06/30/2015, 11:09 AM

## 2015-06-30 NOTE — Progress Notes (Signed)
Dr. Mechele Collin present and gave order for anusol-HC  supp BID.

## 2015-06-30 NOTE — Progress Notes (Signed)
Per [facility] admissions coordinator at [facility] a bed is still available for patient when she is medically stable. Clinical Social Worker (CSW) will continue to follow and assist as needed.   Jetta Lout, LCSWA [phone]

## 2015-06-30 NOTE — Progress Notes (Signed)
[facility]  ROUNDING NOTE   Subjective:   Sodium 127. Furosemide 20mg  IV q12. More edema on examination Diltiazem gtt  Blood in stool   Objective:  Vital signs in last 24 hours:  Temp:  [97.5 F (36.4 C)-98 F (36.7 C)] 97.8 F (36.6 C) (08/02 0400) Pulse Rate:  [34-155] 101 (08/02 0800) Resp:  [15-25] 18 (08/02 0800) BP: (100-156)/(49-139) 145/81 mmHg (08/02 0800) SpO2:  [95 %-99 %] 98 % (08/02 0800)  Weight change:  Filed Weights   06/21/15 1508  Weight: 96.616 kg (213 lb)    Intake/Output: I/O last 3 completed shifts: In: 1278.5 [P.O.:960; I.V.:318.5] Out: 1725 [Urine:1725]   Intake/Output this shift:  Total I/O In: 15 [I.V.:15] Out: -   Physical Exam: General: NAD  Head: Normocephalic, atraumatic. Moist oral mucosal membranes  Eyes: Anicteric, PERRL  Neck: Supple, trachea midline  Lungs:  Clear to auscultation  Heart: Irregular  Abdomen:  Soft, nontender, +distended, +bowel sounds  Extremities: 1+ peripheral and dependent edema in all four extremities  Neurologic: Nonfocal, moving all four extremities  Skin: No lesions       Basic Metabolic Panel:  Recent Labs Lab 06/26/15 1537  06/27/15 1309  06/28/15 0050  06/29/15 0705 06/29/15 0923 06/29/15 2038 06/30/15 0543 06/30/15 0707  NA 117*  < > 126*  < > 127*  < > 130* 130* 127* 127* 128*  K 4.3  --  3.7  --  3.1*  --  3.4*  --   --  3.9  --   CL 86*  --  92*  --  94*  --  95*  --   --  94*  --   CO2 23  --  25  --  26  --  27  --   --  26  --   GLUCOSE 162*  --  300*  --  276*  --  193*  --   --  212*  --   BUN 29*  --  30*  --  28*  --  25*  --   --  28*  --   CREATININE 0.93  --  0.98  --  0.90  --  0.81  --   --  0.88  --   CALCIUM 7.5*  --  7.5*  --  7.3*  --  7.6*  --   --  7.5*  --   MG  --   --   --   --  1.9  --   --   --   --   --   --   < > = values in this interval not displayed.  Liver Function Tests:  Recent Labs Lab 06/26/15 1537  AST 26  ALT 14  ALKPHOS 71   BILITOT 0.5  PROT 5.4*  ALBUMIN 2.2*   No results for input(s): LIPASE, AMYLASE in the last 168 hours. No results for input(s): AMMONIA in the last 168 hours.  CBC:  Recent Labs Lab 06/25/15 0510 06/26/15 0931 06/27/15 1309 06/28/15 0050 06/30/15 0543  WBC 32.3* 15.6* 8.5 7.6 12.4*  HGB 8.4* 8.4* 8.7* 8.7* 8.8*  HCT 26.2* 25.1* 26.7* 26.1* 27.3*  MCV 85.3 84.5 85.8 84.6 86.1  PLT 474* 392 316 289 246    Cardiac Enzymes:  Recent Labs Lab 06/25/15 1316 06/25/15 1935 06/29/15 0923 06/29/15 1446 06/29/15 2038  TROPONINI 0.04* 0.04* 0.42* 0.52* 0.57*    BNP: Invalid input(s): POCBNP  CBG:  Recent Labs Lab 06/29/15  0800 06/29/15 1231 06/29/15 1601 06/29/15 2112 06/30/15 0732  GLUCAP 173* 294* 236* 199* 189*    Microbiology: Results for orders placed or performed during the hospital encounter of 06/21/15  Stool culture     Status: None   Collection Time: 06/21/15  5:17 PM  Result Value Ref Range Status   Specimen Description STOOL  Final   Special Requests Normal  Final   Culture   Final    NO SALMONELLA OR SHIGELLA ISOLATED No Pathogenic E. coli detected NO CAMPYLOBACTER DETECTED    Report Status 06/23/2015 FINAL  Final  C difficile quick scan w PCR reflex ([facility] only)     Status: Abnormal   Collection Time: 06/21/15  5:17 PM  Result Value Ref Range Status   C Diff antigen POSITIVE (A) NEGATIVE Final   C Diff toxin POSITIVE (A) NEGATIVE Final   C Diff interpretation   Final    Positive for toxigenic C. difficile, active toxin production present.    Comment: CRITICAL RESULT CALLED TO, READ BACK BY AND VERIFIED WITH: DONALD SWEENEY ON 06/21/15 AT 1755 BY JEF   MRSA PCR Screening     Status: None   Collection Time: 06/25/15 10:48 AM  Result Value Ref Range Status   MRSA by PCR NEGATIVE NEGATIVE Final    Comment:        The GeneXpert MRSA Assay (FDA approved for NASAL specimens only), is one component of a comprehensive MRSA  colonization surveillance program. It is not intended to diagnose MRSA infection nor to guide or monitor treatment for MRSA infections.     Coagulation Studies: No results for input(s): LABPROT, INR in the last 72 hours.  Urinalysis: No results for input(s): COLORURINE, LABSPEC, PHURINE, GLUCOSEU, HGBUR, BILIRUBINUR, KETONESUR, PROTEINUR, UROBILINOGEN, NITRITE, LEUKOCYTESUR in the last 72 hours.  Invalid input(s): APPERANCEUR    Imaging: Dg Abd Acute W/chest  06/29/2015   CLINICAL DATA:  Chest pain. History of C difficile. Abdominal distention with gas and bloating.  EXAM: DG ABDOMEN ACUTE W/ 1V CHEST  COMPARISON:  Multiple priors.  FINDINGS: Cardiomegaly persists. Mild vascular congestion is stable. Moderate-sized BILATERAL pleural effusions with LEFT greater than RIGHT lower lobe atelectasis, roughly stable from prior CT.  Gaseous distention of small and large bowel is redemonstrated without definite obstruction visible free air. Enteric contrast from prior CT is concentrated in the RIGHT colon. No acute osseous findings.  IMPRESSION: Cardiomegaly with mild vascular congestion, BILATERAL pleural effusions, and lower lobe atelectasis, roughly stable.  Gaseous distention of small and large bowel without definite obstruction or free air ; suspect persistent ileus.   Electronically Signed   By: Elsie Stain M.D.   On: 06/29/2015 11:44     Medications:   . diltiazem (CARDIZEM) infusion 15 mg/hr (06/29/15 2138)   . acidophilus  1 capsule Oral BID  . antiseptic oral rinse  7 mL Mouth Rinse BID  . atorvastatin  80 mg Oral QPM  . benazepril  40 mg Oral BID  . carvedilol  25 mg Oral BID  . digoxin  0.25 mg Intravenous Daily  . diltiazem  240 mg Oral Daily  . dorzolamide  1 drop Both Eyes TID  . famotidine  20 mg Oral Q supper  . feeding supplement (GLUCERNA SHAKE)  237 mL Oral TID WC  . furosemide  20 mg Intravenous Q12H  . hydrALAZINE  100 mg Oral TID  . hydrocortisone cream    Topical QID  . insulin aspart  0-15 Units Subcutaneous TID  WC  . insulin aspart  0-5 Units Subcutaneous QHS  . insulin glargine  15 Units Subcutaneous QHS  . latanoprost  1 drop Both Eyes QPM  . metoprolol tartrate  12.5 mg Oral Q6H  . metroNIDAZOLE  500 mg Oral 3 times per day  . multivitamin with minerals  1 tablet Oral q morning - 10a  . potassium chloride  20 mEq Oral Daily  . sertraline  50 mg Oral Daily  . timolol  1 drop Both Eyes BID  . vancomycin  250 mg Oral 4 times per day   albuterol, alum & mag hydroxide-simeth, diphenhydrAMINE, enalaprilat, hydrALAZINE, labetalol, ondansetron (ZOFRAN) IV, simethicone, traMADol  Assessment/ Plan:  [name] is a 81 y.o. [place] female with diabetes mellitus type II, hypertension, congestive heart failure, hyperlipidemia, coronary artery disease, atrial fibrillation, GERD, mitral valve disorder who was admitted to [facility] on 06/21/2015 for C. Diff colitis. Patient was found to have a serum sodium of 122. Baseline of 132 (05/03/2015).   1. Acute hyponatremia on chronic hyponatremia: hypervolemic on examination. Hypo-osmotic with measured osm of 257. Tolvaptan on 7/29 Seems that acute hyponatremia is hypervolemic while chronic hyponatremia could be due to congestive heart failure - Continue furosemie  IV, continue serial sodium checks.  Will need fluid restriction when off NPO.   2. Hypokalemia: due to IV furosemide. May contributing to GI distension.  - potassium chloride t  3. Acute kidney injury : creatinine now back to baseline. GFR greater than 60.  however may be dilutional from volume overload. Acute renal failure secondary to prerenal azotemia. Chronic kidney disease seems to be age related.  - monitor volume status, urine output and renal function   4. Hypertension with atrial fibrillation with RVR:  - home regimen of benazepril, carvedilol, diltiazem, clonidine, furosemide, hydralazine - diltiazem gtt  - metoprolol started  by cardiology along with dig.    LOS: 9 Zachary Lovins 8/2/20168:51 AM

## 2015-06-30 NOTE — Consult Note (Signed)
Reason for Consult: atrial fibrillation, shortness of breath Referring Physician:  Dr. Bridgett Larsson  hospitalist Cardiologist : Dr. Doreene Nest is an 81 y.o. female.  HPI: patient is 80 Yo Pfohl female with multiple medical problems. Admitted with abdominal discomfort found to be in rapid atrial fibrillation which is paroxysmal. Patient is been on multiple medications is not a good anticoagulation candidate. Complains of weakness and fatigue she has known coronary disease. With reflux and abdominal pain chronically.  Her abdominal distention abnormal EKG non-Q-wave myocardial infarction in the past. Patient is now hospitalized with C diff and is being treated accordingly but has rapid atrial fibrillation difficult-to-control. Denies chest pain still has some shortness of breath. Cardiology consultation was recommended for control of atrial fibrillation.   Past Medical History  Diagnosis Date  . Diabetes mellitus without complication   . Hypertension   . CCF (congestive cardiac failure) 06/04/2015  . Varicose veins     of leg  . Bowel habit changes   . Anemia   . Diabetes mellitus with renal manifestation 03/19/2009  . Vitamin D deficiency   . Hyperglyceridemia, pure   . Hyperlipidemia 02/01/2010  . Hyponatremia   . Mitral valve disorder 04/01/2009  . Atherosclerosis of coronary artery 08/04/2009  . CHF (congestive heart failure)     EF 30-35% on echo 2014  . Allergy 04/04/2009  . GERD (gastroesophageal reflux disease) 11/06/2009  . Hemorrhoids, external without complications 21/19/4174  . Arthritis   . Arthralgia   . Myalgia   . Palpitations   . Chronic nausea   . Malaise and fatigue   . Edema   . Atrial fibrillation 04/01/2009  . Dysuria     Past Surgical History  Procedure Laterality Date  . Tonsillectomy    . Breast surgery    . Abdominal hysterectomy    . Appendectomy    . Hernia repair    . Cholecystectomy    . Stents  2011    Family History  Problem  Relation Age of Onset  . Heart attack Mother   . Cancer Father     Social History:  reports that she has never smoked. She does not have any smokeless tobacco history on file. She reports that she does not drink alcohol. Her drug history is not on file.  Allergies:  Allergies  Allergen Reactions  . Benzocaine Other (See Comments)    Unknown reaction  . Contrast Media [Iodinated Diagnostic Agents] Other (See Comments)    Tachycardia, SVT  . Other Other (See Comments)    MRI dye--reaction unknown.  Marland Kitchen Penicillin V Potassium Other (See Comments)  . Penicillins Other (See Comments)    Reaction: pt doesn't know, b/c it's been a long time ago.   . Sulfa Antibiotics Other (See Comments)    Reaction: pt doesn't know, b/c it's been a long time ago.     Medications: I have reviewed the patient's current medications.  Results for orders placed or performed during the hospital encounter of 06/21/15 (from the past 48 hour(s))  Glucose, capillary     Status: Abnormal   Collection Time: 06/28/15  4:44 PM  Result Value Ref Range   Glucose-Capillary 400 (H) 65 - 99 mg/dL   Comment 1 Notify RN   Sodium     Status: Abnormal   Collection Time: 06/28/15  8:43 PM  Result Value Ref Range   Sodium 129 (L) 135 - 145 mmol/L  Glucose, capillary     Status: Abnormal  Collection Time: 06/28/15  9:49 PM  Result Value Ref Range   Glucose-Capillary 299 (H) 65 - 99 mg/dL   Comment 1 Notify RN   Basic metabolic panel     Status: Abnormal   Collection Time: 06/29/15  7:05 AM  Result Value Ref Range   Sodium 130 (L) 135 - 145 mmol/L   Potassium 3.4 (L) 3.5 - 5.1 mmol/L   Chloride 95 (L) 101 - 111 mmol/L   CO2 27 22 - 32 mmol/L   Glucose, Bld 193 (H) 65 - 99 mg/dL   BUN 25 (H) 6 - 20 mg/dL   Creatinine, Ser 0.81 0.44 - 1.00 mg/dL   Calcium 7.6 (L) 8.9 - 10.3 mg/dL   GFR calc non Af Amer >60 >60 mL/min   GFR calc Af Amer >60 >60 mL/min    Comment: (NOTE) The eGFR has been calculated using the CKD EPI  equation. This calculation has not been validated in all clinical situations. eGFR's persistently <60 mL/min signify possible Chronic Kidney Disease.    Anion gap 8 5 - 15  Glucose, capillary     Status: Abnormal   Collection Time: 06/29/15  8:00 AM  Result Value Ref Range   Glucose-Capillary 173 (H) 65 - 99 mg/dL  Sodium     Status: Abnormal   Collection Time: 06/29/15  9:23 AM  Result Value Ref Range   Sodium 130 (L) 135 - 145 mmol/L  Troponin I (q 6hr x 3)     Status: Abnormal   Collection Time: 06/29/15  9:23 AM  Result Value Ref Range   Troponin I 0.42 (H) <0.031 ng/mL    Comment: READ BACK AND VERIFIED WITH BRITTANY KILLINGSWORTH AT 1324 ON 06/29/15 BY [facility]        PERSISTENTLY INCREASED TROPONIN VALUES IN THE RANGE OF 0.04-0.49 ng/mL CAN BE SEEN IN:       -UNSTABLE ANGINA       -CONGESTIVE HEART FAILURE       -MYOCARDITIS       -CHEST TRAUMA       -ARRYHTHMIAS       -LATE PRESENTING MYOCARDIAL INFARCTION       -COPD   CLINICAL FOLLOW-UP RECOMMENDED.   Glucose, capillary     Status: Abnormal   Collection Time: 06/29/15 12:31 PM  Result Value Ref Range   Glucose-Capillary 294 (H) 65 - 99 mg/dL  Troponin I (q 6hr x 3)     Status: Abnormal   Collection Time: 06/29/15  2:46 PM  Result Value Ref Range   Troponin I 0.52 (H) <0.031 ng/mL    Comment: RESULTS PREVIOUSLY CALLED TO BRITTANY KILLINGWORTH 1034 06/29/2015 BY [facility], [facility]        POSSIBLE MYOCARDIAL ISCHEMIA. SERIAL TESTING RECOMMENDED.   Glucose, capillary     Status: Abnormal   Collection Time: 06/29/15  4:01 PM  Result Value Ref Range   Glucose-Capillary 236 (H) 65 - 99 mg/dL  Sodium     Status: Abnormal   Collection Time: 06/29/15  8:38 PM  Result Value Ref Range   Sodium 127 (L) 135 - 145 mmol/L  Troponin I (q 6hr x 3)     Status: Abnormal   Collection Time: 06/29/15  8:38 PM  Result Value Ref Range   Troponin I 0.57 (H) <0.031 ng/mL    Comment: READ BACK AND VERIFIED HIRAL PATEL 06/29/2015 2114 [facility]         POSSIBLE MYOCARDIAL ISCHEMIA. SERIAL TESTING RECOMMENDED.   Glucose, capillary  Status: Abnormal   Collection Time: 06/29/15  9:12 PM  Result Value Ref Range   Glucose-Capillary 199 (H) 65 - 99 mg/dL  Basic metabolic panel     Status: Abnormal   Collection Time: 06/30/15  5:43 AM  Result Value Ref Range   Sodium 127 (L) 135 - 145 mmol/L   Potassium 3.9 3.5 - 5.1 mmol/L   Chloride 94 (L) 101 - 111 mmol/L   CO2 26 22 - 32 mmol/L   Glucose, Bld 212 (H) 65 - 99 mg/dL   BUN 28 (H) 6 - 20 mg/dL   Creatinine, Ser 0.88 0.44 - 1.00 mg/dL   Calcium 7.5 (L) 8.9 - 10.3 mg/dL   GFR calc non Af Amer 60 (L) >60 mL/min   GFR calc Af Amer >60 >60 mL/min    Comment: (NOTE) The eGFR has been calculated using the CKD EPI equation. This calculation has not been validated in all clinical situations. eGFR's persistently <60 mL/min signify possible Chronic Kidney Disease.    Anion gap 7 5 - 15  CBC     Status: Abnormal   Collection Time: 06/30/15  5:43 AM  Result Value Ref Range   WBC 12.4 (H) 3.6 - 11.0 K/uL   RBC 3.18 (L) 3.80 - 5.20 MIL/uL   Hemoglobin 8.8 (L) 12.0 - 16.0 g/dL   HCT 27.3 (L) 35.0 - 47.0 %   MCV 86.1 80.0 - 100.0 fL   MCH 27.6 26.0 - 34.0 pg   MCHC 32.0 32.0 - 36.0 g/dL   RDW 15.2 (H) 11.5 - 14.5 %   Platelets 246 150 - 440 K/uL  Sodium     Status: Abnormal   Collection Time: 06/30/15  7:07 AM  Result Value Ref Range   Sodium 128 (L) 135 - 145 mmol/L  Glucose, capillary     Status: Abnormal   Collection Time: 06/30/15  7:32 AM  Result Value Ref Range   Glucose-Capillary 189 (H) 65 - 99 mg/dL  Glucose, capillary     Status: Abnormal   Collection Time: 06/30/15 12:03 PM  Result Value Ref Range   Glucose-Capillary 163 (H) 65 - 99 mg/dL    Dg Abd Acute W/chest  06/29/2015   CLINICAL DATA:  Chest pain. History of C difficile. Abdominal distention with gas and bloating.  EXAM: DG ABDOMEN ACUTE W/ 1V CHEST  COMPARISON:  Multiple priors.  FINDINGS: Cardiomegaly  persists. Mild vascular congestion is stable. Moderate-sized BILATERAL pleural effusions with LEFT greater than RIGHT lower lobe atelectasis, roughly stable from prior CT.  Gaseous distention of small and large bowel is redemonstrated without definite obstruction visible free air. Enteric contrast from prior CT is concentrated in the RIGHT colon. No acute osseous findings.  IMPRESSION: Cardiomegaly with mild vascular congestion, BILATERAL pleural effusions, and lower lobe atelectasis, roughly stable.  Gaseous distention of small and large bowel without definite obstruction or free air ; suspect persistent ileus.   Electronically Signed   By: Staci Righter M.D.   On: 06/29/2015 11:44    Review of Systems  Constitutional: Positive for fever, chills and malaise/fatigue.  HENT: Positive for congestion.   Eyes: Negative.   Respiratory: Positive for shortness of breath.   Cardiovascular: Positive for palpitations, orthopnea and leg swelling.  Gastrointestinal: Positive for heartburn, nausea, vomiting, abdominal pain, diarrhea and blood in stool.  Genitourinary: Negative.   Musculoskeletal: Negative.   Skin: Negative.   Neurological: Positive for weakness.  Psychiatric/Behavioral: Positive for depression. The patient is nervous/anxious.  Blood pressure 133/60, pulse 86, temperature 98 F (36.7 C), temperature source Oral, resp. rate 17, height _0  (1.6 m), weight 96.616 kg (213 lb), SpO2 98 %. Physical Exam  Vitals reviewed. Constitutional: She appears well-developed and well-nourished.  HENT:  Head: Normocephalic and atraumatic.  Right Ear: External ear normal.  Eyes: Conjunctivae and EOM are normal. Pupils are equal, round, and reactive to light.  Neck: Normal range of motion. Neck supple.  Cardiovascular: S1 normal, S2 normal and intact distal pulses.  An irregularly irregular rhythm present. Exam reveals gallop, S4 and distant heart sounds.   Murmur heard.  Systolic murmur is present  with a grade of 2/6    Assessment/Plan:  atrial fibrillation  C diff  diarrhea  abdominal pain  hypertension  diabetes  abnormal EKG  GERD  hypertension  weakness  malaise  fatigue . PLAN  agree with ICU care  continue treatment for C diff toxin  continue treatment for diarrhea  rate control for atrial fibrillation with IV Cardizem  advanced overall Cardizem so we can wean IV Cardizem  beta-blockade therapy  add digoxin to help with rate control  continue Pepcid/ consider omeprazole therapy  diabetes therapy including insulin  continue hypertension control with enalapril hydralazine metoprolol and  Benazepril  agree with Lipitor therapy for lipid management  Abia Monaco D. 06/30/2015, 12:21 PM

## 2015-07-01 ENCOUNTER — Inpatient Hospital Stay: Payer: Medicare Other

## 2015-07-01 ENCOUNTER — Inpatient Hospital Stay
Admit: 2015-07-01 | Discharge: 2015-07-01 | Disposition: A | Discharge status: Home / Self Care | Payer: Medicare Other | Attending: Internal Medicine | Admitting: Internal Medicine

## 2015-07-01 LAB — BASIC METABOLIC PANEL
ANION GAP: 7 (ref 5–15)
BUN: 32 mg/dL — ABNORMAL HIGH (ref 6–20)
CHLORIDE: 93 mmol/L — AB (ref 101–111)
CO2: 27 mmol/L (ref 22–32)
Calcium: 7.7 mg/dL — ABNORMAL LOW (ref 8.9–10.3)
Creatinine, Ser: 0.96 mg/dL (ref 0.44–1.00)
GFR calc Af Amer: >60 mL/min (ref >60)
GFR, EST NON AFRICAN AMERICAN: 54 mL/min — AB (ref >60)
Glucose, Bld: 149 mg/dL — ABNORMAL HIGH (ref 65–99)
Potassium: 4.2 mmol/L (ref 3.5–5.1)
Sodium: 127 mmol/L — ABNORMAL LOW (ref 135–145)

## 2015-07-01 LAB — CBC
HCT: 26.7 % — ABNORMAL LOW (ref 35.0–47.0)
HEMOGLOBIN: 9 g/dL — AB (ref 12.0–16.0)
MCH: 28.5 pg (ref 26.0–34.0)
MCHC: 33.6 g/dL (ref 32.0–36.0)
MCV: 84.9 fL (ref 80.0–100.0)
PLATELETS: 224 10*3/uL (ref 150–440)
RBC: 3.14 MIL/uL — ABNORMAL LOW (ref 3.80–5.20)
RDW: 15.1 % — ABNORMAL HIGH (ref 11.5–14.5)
WBC: 14.3 10*3/uL — ABNORMAL HIGH (ref 3.6–11.0)

## 2015-07-01 LAB — GLUCOSE, CAPILLARY
Glucose-Capillary: 135 mg/dL — ABNORMAL HIGH (ref 65–99)
Glucose-Capillary: 237 mg/dL — ABNORMAL HIGH (ref 65–99)
Glucose-Capillary: 235 mg/dL — ABNORMAL HIGH (ref 65–99)

## 2015-07-01 LAB — SODIUM
Sodium: 127 mmol/L — ABNORMAL LOW (ref 135–145)
Sodium: 127 mmol/L — ABNORMAL LOW (ref 135–145)

## 2015-07-01 MED ORDER — FUROSEMIDE 10 MG/ML IJ SOLN
40.0000 mg | Freq: Two times a day (BID) | INTRAMUSCULAR | Status: DC
  Administered 2015-07-01 – 2015-07-05 (×8): 40 mg via INTRAVENOUS
  Filled 2015-07-01 (×8): qty 4

## 2015-07-01 NOTE — Progress Notes (Signed)
[facility] [place] [facility] [place]   PATIENT NAME: Katherine [name]    MR#:  [id number]  DATE OF BIRTH:  11/29/1931  SUBJECTIVE:  Loose stool but no Bloody stool, better Abdominal gas and distention, Weakness. REVIEW OF SYSTEMS:   Review of Systems  Constitutional: Negative for fever, chills and weight loss.  HENT: Negative for ear discharge, ear pain and nosebleeds.   Eyes: Negative for blurred vision, pain and discharge.  Respiratory: Negative for sputum production, shortness of breath, wheezing and stridor.   Cardiovascular: Positive for leg swelling. Negative for chest pain, palpitations, orthopnea and PND.  Gastrointestinal: Negative for vomiting and blood in stool.  Genitourinary: Negative for urgency and frequency.  Musculoskeletal: Negative for back pain and joint pain.  Neurological: Positive for weakness. Negative for sensory change, speech change and focal weakness.  Psychiatric/Behavioral: Negative for depression. The patient is not nervous/anxious.   All other systems reviewed and are negative.  Tolerating Diet: not able to eat much, very nauseous and distended abdomen Tolerating PT: eval pending DRUG ALLERGIES:   Allergies  Allergen Reactions  . Benzocaine Other (See Comments)    Unknown reaction  . Contrast Media [Iodinated Diagnostic Agents] Other (See Comments)    Tachycardia, SVT  . Other Other (See Comments)    MRI dye--reaction unknown.  Marland Kitchen Penicillin V Potassium Other (See Comments)  . Penicillins Other (See Comments)    Reaction: pt doesn't know, b/c it's been a long time ago.   . Sulfa Antibiotics Other (See Comments)    Reaction: pt doesn't know, b/c it's been a long time ago.    VITALS:  Blood pressure 118/73, pulse 95, temperature 97.5 F (36.4 C), temperature source Oral, resp. rate 15, height  (1.6 m), weight 96.616 kg (213 lb), SpO2 97 %. PHYSICAL EXAMINATION:  Physical Exam  Constitutional: She is oriented to  person, place, and time and well-developed, well-nourished, and in no distress.  HENT:  Head: Normocephalic and atraumatic.  Eyes: Conjunctivae and EOM are normal. Pupils are equal, round, and reactive to light.  Neck: Normal range of motion. Neck supple. No tracheal deviation present. No thyromegaly present.  Cardiovascular: Normal rate, regular rhythm and normal heart sounds.   Pulmonary/Chest: Effort normal and breath sounds normal. No respiratory distress. She has no wheezes. She exhibits no tenderness.  Abdominal: Soft. Bowel sounds are normal. She exhibits distension (gaseous). There is tenderness.  Musculoskeletal: Normal range of motion. She exhibits edema.  Neurological: She is alert and oriented to person, place, and time. No cranial nerve deficit.  Skin: Skin is warm and dry. No rash noted.  Psychiatric: Mood and affect normal.   LABORATORY PANEL:   CBC  Recent Labs Lab 07/01/15 0507  WBC 14.3*  HGB 9.0*  HCT 26.7*  PLT 224    Chemistries   Recent Labs Lab 06/26/15 1537  06/28/15 0050  07/01/15 0507 07/01/15 0856  NA 117*  < > 127*  < > 127* 127*  K 4.3  < > 3.1*  < > 4.2  --   CL 86*  < > 94*  < > 93*  --   CO2 23  < > 26  < > 27  --   GLUCOSE 162*  < > 276*  < > 149*  --   BUN 29*  < > 28*  < > 32*  --   CREATININE 0.93  < > 0.90  < > 0.96  --   CALCIUM 7.5*  < >  7.3*  < > 7.7*  --   MG  --   --  1.9  --   --   --   AST 26  --   --   --   --   --   ALT 14  --   --   --   --   --   ALKPHOS 71  --   --   --   --   --   BILITOT 0.5  --   --   --   --   --   < > = values in this interval not displayed.  Cardiac Enzymes  Recent Labs Lab 06/29/15 2038  TROPONINI 0.57*   RADIOLOGY:  Dg Abd 2 Views  07/01/2015   CLINICAL DATA:  Abdominal pain.  EXAM: ABDOMEN - 2 VIEW  COMPARISON:  06/29/2015.  FINDINGS: Partial improvement of small and large bowel distention suggesting improving adynamic ileus. No free air. Surgical clips right upper quadrant.  Degenerative changes lumbar spine and both hips.  IMPRESSION: Partial improvement of small large bowel distention suggesting improving adynamic ileus.   Electronically Signed   By: Maisie Fus  Register   On: 07/01/2015 07:20   ASSESSMENT AND PLAN:  81 yr old W F with multiple medical problmes got out of rehab after having treated with IV abxs for Orbital cellulitis. Presented after a fall at home and ongoing diarrhea.   1. C. Diff: Collitis: Improved clinically. No diarrhea. Leukocytosis improved.  dicontinued flagyl, continue PO vanco and discontinue imipenem.  * GIB possible due to hemmoroid: advance to heart healthy and ADA diet. continue ASA, heparin SQ, Hb is stable.   2.  Acute hyponatremia on chronic hyponatremia: likely due to SIADH, given one dose of tolvaptan, improving but worse for the past 2 days. Increase lasix to 40 mg bid per Dr. Wynelle Link, Follow-up BMP.  3. DM. Blood sugar is better, continue Lantus and Continue sliding scale.  Hypoglycemia: Improved.  4. A-fib with RVR.   off carddizem drip, continue ASA,  Continue carvedilol, diltiazem and lopreesor per Dr. Juliann Pares. Echo shows normal EF at 60%.  5.left elbow pain after fall with abnromal xray. Improved. -appreciate ortho input - no pathology  6. Acute kidney injury on chronic kidney disease stage III: improved and stable.  7. Hypertension:    continue benazepril, carvedilol, diltiazem, lopressor, clonidine, furosemide, hydralazine  IV hydralazine prn.  * Pleural effusion.  from volume overload and sepsis. continue Lasix  * Chronic diastolic CHF ( EF at 60-65%). Continue lasix and  Benazepril.  * Ileus. Secondary to severe C. difficile colitis.  abdominal distention is better. Advance diet.  PT Evaluation suggested skilled nursing facility placement.  Discussed with Dr. Wynelle Link. Case discussed with Care Management/Social Worker.  CODE STATUS: full  DVT Prophylaxis: heparin  TOTAL TIME TAKING CARE OF THIS  PATIENT: 42 minutes.   >50% time spent on counselling and coordination of care.  POSSIBLE D/C IN 2-3 DAYS, DEPENDING ON CLINICAL CONDITION.   Shaune Pollack M.D on 07/01/2015 at 1:40 PM  Between 7am to 6pm - Pager - [phone]  After 6pm go to [email or web address] - password [email or web address] [facility]  [facility]  Office  [phone]  CC: Primary care physician; Mila Merry, MD

## 2015-07-01 NOTE — Consult Note (Signed)
Pt with increased appetite, no vomiting, some loose stools.  CBC noted, abd film shows improvement in ileus, abd exam less tenderness and better bowel sounds.  Pt on vancomycin.  Generalized edema noted, hyponatremia.  No new suggstions.  Dr. Marva Panda will cover for me for next few days until 07/06/2015

## 2015-07-01 NOTE — Progress Notes (Signed)
[facility]  ROUNDING NOTE   Subjective:   Increasing peripheral edema. No improvement in sodium.  To be transferred to telemetry today.   Objective:  Vital signs in last 24 hours:  Temp:  [97.2 F (36.2 C)-98 F (36.7 C)] 97.5 F (36.4 C) (08/03 0400) Pulse Rate:  [38-135] 75 (08/03 0700) Resp:  [15-22] 15 (08/03 0700) BP: (98-141)/(44-102) 130/72 mmHg (08/03 0700) SpO2:  [97 %-99 %] 97 % (08/03 0700)  Weight change:  Filed Weights   06/21/15 1508  Weight: 96.616 kg (213 lb)    Intake/Output: I/O last 3 completed shifts: In: 845 [P.O.:450; I.V.:395] Out: 1131 [Urine:1130; Stool:1]   Intake/Output this shift:     Physical Exam: General: NAD  Head: Normocephalic, atraumatic. Moist oral mucosal membranes  Eyes: Anicteric, PERRL  Neck: Supple, trachea midline  Lungs:  Clear to auscultation  Heart: Irregular  Abdomen:  Soft, nontender, +distended, +bowel sounds  Extremities: 2+ peripheral and dependent edema in all four extremities  Neurologic: Nonfocal, moving all four extremities  Skin: No lesions       Basic Metabolic Panel:  Recent Labs Lab 06/27/15 1309  06/28/15 0050  06/29/15 0705  06/29/15 2038 06/30/15 0543 06/30/15 0707 06/30/15 2056 07/01/15 0507  NA 126*  < > 127*  < > 130*  < > 127* 127* 128* 126* 127*  K 3.7  --  3.1*  --  3.4*  --   --  3.9  --   --  4.2  CL 92*  --  94*  --  95*  --   --  94*  --   --  93*  CO2 25  --  26  --  27  --   --  26  --   --  27  GLUCOSE 300*  --  276*  --  193*  --   --  212*  --   --  149*  BUN 30*  --  28*  --  25*  --   --  28*  --   --  32*  CREATININE 0.98  --  0.90  --  0.81  --   --  0.88  --   --  0.96  CALCIUM 7.5*  --  7.3*  --  7.6*  --   --  7.5*  --   --  7.7*  MG  --   --  1.9  --   --   --   --   --   --   --   --   < > = values in this interval not displayed.  Liver Function Tests:  Recent Labs Lab 06/26/15 1537  AST 26  ALT 14  ALKPHOS 71  BILITOT 0.5  PROT 5.4*   ALBUMIN 2.2*   No results for input(s): LIPASE, AMYLASE in the last 168 hours. No results for input(s): AMMONIA in the last 168 hours.  CBC:  Recent Labs Lab 06/26/15 0931 06/27/15 1309 06/28/15 0050 06/30/15 0543 07/01/15 0507  WBC 15.6* 8.5 7.6 12.4* 14.3*  HGB 8.4* 8.7* 8.7* 8.8* 9.0*  HCT 25.1* 26.7* 26.1* 27.3* 26.7*  MCV 84.5 85.8 84.6 86.1 84.9  PLT 392 316 289 246 224    Cardiac Enzymes:  Recent Labs Lab 06/25/15 1316 06/25/15 1935 06/29/15 0923 06/29/15 1446 06/29/15 2038  TROPONINI 0.04* 0.04* 0.42* 0.52* 0.57*    BNP: Invalid input(s): POCBNP  CBG:  Recent Labs Lab 06/30/15 0732 06/30/15 1203 06/30/15 1635 06/30/15 2107  07/01/15 0742  GLUCAP 189* 163* 208* 217* 135*    Microbiology: Results for orders placed or performed during the hospital encounter of 06/21/15  Stool culture     Status: None   Collection Time: 06/21/15  5:17 PM  Result Value Ref Range Status   Specimen Description STOOL  Final   Special Requests Normal  Final   Culture   Final    NO SALMONELLA OR SHIGELLA ISOLATED No Pathogenic E. coli detected NO CAMPYLOBACTER DETECTED    Report Status 06/23/2015 FINAL  Final  C difficile quick scan w PCR reflex ([facility] only)     Status: Abnormal   Collection Time: 06/21/15  5:17 PM  Result Value Ref Range Status   C Diff antigen POSITIVE (A) NEGATIVE Final   C Diff toxin POSITIVE (A) NEGATIVE Final   C Diff interpretation   Final    Positive for toxigenic C. difficile, active toxin production present.    Comment: CRITICAL RESULT CALLED TO, READ BACK BY AND VERIFIED WITH: DONALD SWEENEY ON 06/21/15 AT 1755 BY JEF   MRSA PCR Screening     Status: None   Collection Time: 06/25/15 10:48 AM  Result Value Ref Range Status   MRSA by PCR NEGATIVE NEGATIVE Final    Comment:        The GeneXpert MRSA Assay (FDA approved for NASAL specimens only), is one component of a comprehensive MRSA colonization surveillance program. It is  not intended to diagnose MRSA infection nor to guide or monitor treatment for MRSA infections.     Coagulation Studies: No results for input(s): LABPROT, INR in the last 72 hours.  Urinalysis: No results for input(s): COLORURINE, LABSPEC, PHURINE, GLUCOSEU, HGBUR, BILIRUBINUR, KETONESUR, PROTEINUR, UROBILINOGEN, NITRITE, LEUKOCYTESUR in the last 72 hours.  Invalid input(s): APPERANCEUR    Imaging: Dg Abd 2 Views  07/01/2015   CLINICAL DATA:  Abdominal pain.  EXAM: ABDOMEN - 2 VIEW  COMPARISON:  06/29/2015.  FINDINGS: Partial improvement of small and large bowel distention suggesting improving adynamic ileus. No free air. Surgical clips right upper quadrant. Degenerative changes lumbar spine and both hips.  IMPRESSION: Partial improvement of small large bowel distention suggesting improving adynamic ileus.   Electronically Signed   By: Maisie Fus  Register   On: 07/01/2015 07:20   Dg Abd Acute W/chest  06/29/2015   CLINICAL DATA:  Chest pain. History of C difficile. Abdominal distention with gas and bloating.  EXAM: DG ABDOMEN ACUTE W/ 1V CHEST  COMPARISON:  Multiple priors.  FINDINGS: Cardiomegaly persists. Mild vascular congestion is stable. Moderate-sized BILATERAL pleural effusions with LEFT greater than RIGHT lower lobe atelectasis, roughly stable from prior CT.  Gaseous distention of small and large bowel is redemonstrated without definite obstruction visible free air. Enteric contrast from prior CT is concentrated in the RIGHT colon. No acute osseous findings.  IMPRESSION: Cardiomegaly with mild vascular congestion, BILATERAL pleural effusions, and lower lobe atelectasis, roughly stable.  Gaseous distention of small and large bowel without definite obstruction or free air ; suspect persistent ileus.   Electronically Signed   By: Elsie Stain M.D.   On: 06/29/2015 11:44     Medications:   . diltiazem (CARDIZEM) infusion Stopped (07/01/15 0400)   . acidophilus  1 capsule Oral BID  .  antiseptic oral rinse  7 mL Mouth Rinse BID  . aspirin  81 mg Oral Daily  . atorvastatin  80 mg Oral QPM  . benazepril  40 mg Oral BID  . carvedilol  25  mg Oral BID  . digoxin  0.25 mg Intravenous Daily  . diltiazem  240 mg Oral Daily  . dorzolamide  1 drop Both Eyes TID  . famotidine  20 mg Oral Q supper  . feeding supplement (GLUCERNA SHAKE)  237 mL Oral TID WC  . furosemide  20 mg Intravenous Q12H  . heparin subcutaneous  5,000 Units Subcutaneous 3 times per day  . hydrALAZINE  100 mg Oral TID  . hydrocortisone  25 mg Rectal BID  . hydrocortisone cream   Topical QID  . insulin aspart  0-15 Units Subcutaneous TID WC  . insulin aspart  0-5 Units Subcutaneous QHS  . insulin glargine  15 Units Subcutaneous QHS  . latanoprost  1 drop Both Eyes QPM  . metoprolol tartrate  12.5 mg Oral Q6H  . multivitamin with minerals  1 tablet Oral q morning - 10a  . potassium chloride  20 mEq Oral Daily  . sertraline  50 mg Oral Daily  . timolol  1 drop Both Eyes BID  . vancomycin  250 mg Oral 4 times per day   albuterol, alum & mag hydroxide-simeth, diphenhydrAMINE, enalaprilat, hydrALAZINE, labetalol, morphine injection, ondansetron (ZOFRAN) IV, simethicone, traMADol  Assessment/ Plan:  [name] is a 81 y.o. [place] female with diabetes mellitus type II, hypertension, congestive heart failure, hyperlipidemia, coronary artery disease, atrial fibrillation, GERD, mitral valve disorder who was admitted to [facility] on 06/21/2015 for C. Diff colitis. Patient was found to have a serum sodium of 122. Baseline of 132 (05/03/2015).   1. Acute hyponatremia on chronic hyponatremia: hypervolemic on examination. Hypo-osmotic with measured osm of 257. Tolvaptan on 7/29 Seems that acute hyponatremia is hypervolemic while chronic hyponatremia could be due to congestive heart failure - Increase furosemie to 40mg  IV q12, continue serial sodium checks.  Will need fluid restriction - check echocardiogram for right  sided heart failure.   2. Hypokalemia: due to IV furosemide. May contributing to GI distension.  - potassium chloride   3. Acute kidney injury : creatinine now back to baseline. GFR greater than 60.  however may be dilutional from volume overload. Acute renal failure secondary to prerenal azotemia. Chronic kidney disease seems to be age related.  - monitor volume status, urine output and renal function   4. Hypertension with atrial fibrillation with RVR:  - home regimen of benazepril, carvedilol, diltiazem, clonidine, furosemide, hydralazine - diltiazem gtt  - metoprolol started by cardiology along with dig.    LOS: 10 Beth Spackman 8/3/20168:28 AM

## 2015-07-01 NOTE — Progress Notes (Signed)
*  PRELIMINARY RESULTS* Echocardiogram 2D Echocardiogram has been performed.  Georgann Housekeeper Hege 07/01/2015, 11:02 AM

## 2015-07-02 LAB — GLUCOSE, CAPILLARY
Glucose-Capillary: 199 mg/dL — ABNORMAL HIGH (ref 65–99)
Glucose-Capillary: 212 mg/dL — ABNORMAL HIGH (ref 65–99)
Glucose-Capillary: 220 mg/dL — ABNORMAL HIGH (ref 65–99)
Glucose-Capillary: 243 mg/dL — ABNORMAL HIGH (ref 65–99)

## 2015-07-02 LAB — CBC
HCT: 29.2 % — ABNORMAL LOW (ref 35.0–47.0)
Hemoglobin: 9.4 g/dL — ABNORMAL LOW (ref 12.0–16.0)
MCH: 27.7 pg (ref 26.0–34.0)
MCHC: 32.4 g/dL (ref 32.0–36.0)
MCV: 85.6 fL (ref 80.0–100.0)
PLATELETS: 229 10*3/uL (ref 150–440)
RBC: 3.41 MIL/uL — ABNORMAL LOW (ref 3.80–5.20)
RDW: 15.7 % — ABNORMAL HIGH (ref 11.5–14.5)
WBC: 20.5 10*3/uL — AB (ref 3.6–11.0)

## 2015-07-02 LAB — BASIC METABOLIC PANEL
ANION GAP: 7 (ref 5–15)
BUN: 31 mg/dL — ABNORMAL HIGH (ref 6–20)
CALCIUM: 7.6 mg/dL — AB (ref 8.9–10.3)
CHLORIDE: 94 mmol/L — AB (ref 101–111)
CO2: 29 mmol/L (ref 22–32)
Creatinine, Ser: 0.87 mg/dL (ref 0.44–1.00)
GFR calc Af Amer: >60 mL/min (ref >60)
Glucose, Bld: 197 mg/dL — ABNORMAL HIGH (ref 65–99)
POTASSIUM: 3.8 mmol/L (ref 3.5–5.1)
Sodium: 130 mmol/L — ABNORMAL LOW (ref 135–145)

## 2015-07-02 LAB — SODIUM
SODIUM: 129 mmol/L — AB (ref 135–145)
Sodium: 128 mmol/L — ABNORMAL LOW (ref 135–145)

## 2015-07-02 LAB — ALBUMIN: ALBUMIN: 2.2 g/dL — AB (ref 3.5–5.0)

## 2015-07-02 MED ORDER — DIGOXIN 250 MCG PO TABS
0.2500 mg | ORAL_TABLET | Freq: Every day | ORAL | Status: DC
  Administered 2015-07-03 – 2015-07-13 (×11): 0.25 mg via ORAL
  Filled 2015-07-02 (×12): qty 1

## 2015-07-02 MED ORDER — INSULIN GLARGINE 100 UNIT/ML ~~LOC~~ SOLN
20.0000 [IU] | Freq: Every day | SUBCUTANEOUS | Status: DC
  Administered 2015-07-02 – 2015-07-07 (×6): 20 [IU] via SUBCUTANEOUS
  Filled 2015-07-02 (×7): qty 0.2

## 2015-07-02 NOTE — Care Management Important Message (Signed)
Important Message  Patient Details  Name: Katherine [name] MRN: [id number] Date of Birth: October 04, 1932   Medicare Important Message Given:  Yes-fourth notification given    Eber Hong, RN 07/02/2015, 4:48 PM

## 2015-07-02 NOTE — Progress Notes (Signed)
Pt remains in A Fib, pt CO pain and a sore bottom, skin is very red, barrier cream applied. She has had 4 episodes of diarrhea since 1900. Report given to [facility] on telemetry.

## 2015-07-02 NOTE — Progress Notes (Signed)
Arrived to room alert with no distress noted. Telemetry reading atrial fibrillation rate 105.Foley catheter intact draining amber urine.

## 2015-07-02 NOTE — Consult Note (Addendum)
Subjective: Patient seen for diarrhea, Clostridium difficile colitis. Patient has been hemodynamically stable. She had quite a few bowel movements last night but only 2 since this about 1:00 this morning. She states she does not feel well. He denies any nausea or abdominal pain. Her main complaint is perirectal irritation. This is currently being treated with barrier creams and she is also on the course on cream for hemorrhoids.  Objective: Vital signs in last 24 hours: Temp:  [97 F (36.1 C)-97.7 F (36.5 C)] 97.6 F (36.4 C) (08/04 1157) Pulse Rate:  [36-145] 82 (08/04 1157) Resp:  [15-24] 22 (08/04 1157) BP: (115-156)/(46-98) 155/54 mmHg (08/04 1157) SpO2:  [96 %-100 %] 98 % (08/04 1157) Weight:  [97.251 kg (214 lb 6.4 oz)] 97.251 kg (214 lb 6.4 oz) (08/04 0427) Blood pressure 155/54, pulse 82, temperature 97.6 F (36.4 C), temperature source Oral, resp. rate 22, height 5\' 3"  (1.6 m), weight 97.251 kg (214 lb 6.4 oz), SpO2 98 %.   Intake/Output from previous day: 08/03 0701 - 08/04 0700 In: -  Out: 1750 [Urine:1750]  Intake/Output this shift:     General appearance:  Elderly-appearing female no acute distress Resp:  Coarse rhonchi bilaterally. Cardio:  Irregularly irregular GI:  Obese, soft, nontender. Bowel sounds are positive. Extremities:  Positive lower extremity and upper extremity edema, 2+   Lab Results: Results for orders placed or performed during the hospital encounter of 06/21/15 (from the past 24 hour(s))  Glucose, capillary     Status: Abnormal   Collection Time: 07/01/15  4:10 PM  Result Value Ref Range   Glucose-Capillary 235 (H) 65 - 99 mg/dL  Sodium     Status: Abnormal   Collection Time: 07/01/15  9:12 PM  Result Value Ref Range   Sodium 127 (L) 135 - 145 mmol/L  Glucose, capillary     Status: Abnormal   Collection Time: 07/02/15  8:05 AM  Result Value Ref Range   Glucose-Capillary 199 (H) 65 - 99 mg/dL   Comment 1 Notify RN    Comment 2 Document  in Chart   Basic metabolic panel     Status: Abnormal   Collection Time: 07/02/15  8:47 AM  Result Value Ref Range   Sodium 130 (L) 135 - 145 mmol/L   Potassium 3.8 3.5 - 5.1 mmol/L   Chloride 94 (L) 101 - 111 mmol/L   CO2 29 22 - 32 mmol/L   Glucose, Bld 197 (H) 65 - 99 mg/dL   BUN 31 (H) 6 - 20 mg/dL   Creatinine, Ser 4.78 0.44 - 1.00 mg/dL   Calcium 7.6 (L) 8.9 - 10.3 mg/dL   GFR calc non Af Amer >60 >60 mL/min   GFR calc Af Amer >60 >60 mL/min   Anion gap 7 5 - 15  CBC     Status: Abnormal   Collection Time: 07/02/15  8:47 AM  Result Value Ref Range   WBC 20.5 (H) 3.6 - 11.0 K/uL   RBC 3.41 (L) 3.80 - 5.20 MIL/uL   Hemoglobin 9.4 (L) 12.0 - 16.0 g/dL   HCT 29.5 (L) 62.1 - 30.8 %   MCV 85.6 80.0 - 100.0 fL   MCH 27.7 26.0 - 34.0 pg   MCHC 32.4 32.0 - 36.0 g/dL   RDW 65.7 (H) 84.6 - 96.2 %   Platelets 229 150 - 440 K/uL  Sodium     Status: Abnormal   Collection Time: 07/02/15  8:47 AM  Result Value Ref Range  Sodium 129 (L) 135 - 145 mmol/L  Albumin     Status: Abnormal   Collection Time: 07/02/15  8:48 AM  Result Value Ref Range   Albumin 2.2 (L) 3.5 - 5.0 g/dL  Glucose, capillary     Status: Abnormal   Collection Time: 07/02/15 11:56 AM  Result Value Ref Range   Glucose-Capillary 212 (H) 65 - 99 mg/dL   Comment 1 Notify RN    Comment 2 Document in Chart       Recent Labs  06/30/15 0543 07-26-2015 0507 07/02/15 0847  WBC 12.4* 14.3* 20.5*  HGB 8.8* 9.0* 9.4*  HCT 27.3* 26.7* 29.2*  PLT 246 224 229   BMET  Recent Labs  06/30/15 0543  2015-07-26 0507 2015/07/26 0856 2015-07-26 2112 07/02/15 0847  NA 127*  < > 127* 127* 127* 130*  129*  K 3.9  --  4.2  --   --  3.8  CL 94*  --  93*  --   --  94*  CO2 26  --  27  --   --  29  GLUCOSE 212*  --  149*  --   --  197*  BUN 28*  --  32*  --   --  31*  CREATININE 0.88  --  0.96  --   --  0.87  CALCIUM 7.5*  --  7.7*  --   --  7.6*  < > = values in this interval not displayed. LFT  Recent Labs   07/02/15 0848  ALBUMIN 2.2*   PT/INR No results for input(s): LABPROT, INR in the last 72 hours. Hepatitis Panel No results for input(s): HEPBSAG, HCVAB, HEPAIGM, HEPBIGM in the last 72 hours. C-Diff No results for input(s): CDIFFTOX in the last 72 hours. No results for input(s): CDIFFPCR in the last 72 hours.   Studies/Results: Dg Abd 2 Views  2015/07/26   CLINICAL DATA:  Abdominal pain.  EXAM: ABDOMEN - 2 VIEW  COMPARISON:  06/29/2015.  FINDINGS: Partial improvement of small and large bowel distention suggesting improving adynamic ileus. No free air. Surgical clips right upper quadrant. Degenerative changes lumbar spine and both hips.  IMPRESSION: Partial improvement of small large bowel distention suggesting improving adynamic ileus.   Electronically Signed   By: Maisie Fus  Register   On: 07/26/15 07:20    Scheduled Inpatient Medications:   . acidophilus  1 capsule Oral BID  . antiseptic oral rinse  7 mL Mouth Rinse BID  . aspirin  81 mg Oral Daily  . atorvastatin  80 mg Oral QPM  . benazepril  40 mg Oral BID  . carvedilol  25 mg Oral BID  . digoxin  0.25 mg Intravenous Daily  . diltiazem  240 mg Oral Daily  . dorzolamide  1 drop Both Eyes TID  . famotidine  20 mg Oral Q supper  . feeding supplement (GLUCERNA SHAKE)  237 mL Oral TID WC  . furosemide  40 mg Intravenous Q12H  . heparin subcutaneous  5,000 Units Subcutaneous 3 times per day  . hydrALAZINE  100 mg Oral TID  . hydrocortisone  25 mg Rectal BID  . hydrocortisone cream   Topical QID  . insulin aspart  0-15 Units Subcutaneous TID WC  . insulin aspart  0-5 Units Subcutaneous QHS  . insulin glargine  15 Units Subcutaneous QHS  . latanoprost  1 drop Both Eyes QPM  . metoprolol tartrate  12.5 mg Oral Q6H  . multivitamin with minerals  1 tablet  Oral q morning - 10a  . potassium chloride  20 mEq Oral Daily  . sertraline  50 mg Oral Daily  . timolol  1 drop Both Eyes BID  . vancomycin  250 mg Oral 4 times per day     Continuous Inpatient Infusions:   . diltiazem (CARDIZEM) infusion Stopped (07/01/15 0400)    PRN Inpatient Medications:  albuterol, alum & mag hydroxide-simeth, diphenhydrAMINE, enalaprilat, hydrALAZINE, labetalol, morphine injection, ondansetron (ZOFRAN) IV, simethicone, traMADol  Miscellaneous:   Assessment:  1. Clostridium difficile colitis. Patient currently on by mouth vancomycin as a single agent. Increasing wbc's noted. 2. Anasarca possibly related to hypoalbuminemia/diastolic chf. Albumen stable over the last week. Echocardiogram showing EF of 60-65%.  Plan:  1. Continue current. We'll discuss with nephrology. 2. Will also place her on florastor. 3. I discussed the case with Dr. Mechele Collin yesterday he feels she is not a candidate for stool transplant. Following with you  Christena Deem MD 07/02/2015, 2:25 PM

## 2015-07-02 NOTE — Progress Notes (Signed)
[facility]  ROUNDING NOTE   Subjective:   Moved to telemetry. Diarrhea overnight Na 129. Edema somewhat improved.   Objective:  Vital signs in last 24 hours:  Temp:  [97 F (36.1 C)-97.7 F (36.5 C)] 97.6 F (36.4 C) (08/04 1157) Pulse Rate:  [31-145] 82 (08/04 1157) Resp:  [15-24] 22 (08/04 1157) BP: (115-156)/(46-98) 155/54 mmHg (08/04 1157) SpO2:  [96 %-100 %] 98 % (08/04 1157) Weight:  [97.251 kg (214 lb 6.4 oz)] 97.251 kg (214 lb 6.4 oz) (08/04 0427)  Weight change:  Filed Weights   06/21/15 1508 07/02/15 0427  Weight: 96.616 kg (213 lb) 97.251 kg (214 lb 6.4 oz)    Intake/Output: I/O last 3 completed shifts: In: 60 [I.V.:60] Out: 2150 [Urine:2150]   Intake/Output this shift:     Physical Exam: General: NAD  Head: Normocephalic, atraumatic. Moist oral mucosal membranes  Eyes: Anicteric, PERRL  Neck: Supple, trachea midline  Lungs:  Clear to auscultation  Heart: Irregular  Abdomen:  Soft, tender to palpation, +bowel sounds  Extremities: 1+ peripheral and dependent edema in all four extremities  Neurologic: Nonfocal, moving all four extremities  Skin: No lesions       Basic Metabolic Panel:  Recent Labs Lab 06/27/15 1309  06/28/15 0050  06/29/15 0705  06/30/15 0543  06/30/15 2056 07/01/15 0507 07/01/15 0856 07/01/15 2112 07/02/15 0847  NA 126*  < > 127*  < > 130*  < > 127*  < > 126* 127* 127* 127* 129*  K 3.7  --  3.1*  --  3.4*  --  3.9  --   --  4.2  --   --   --   CL 92*  --  94*  --  95*  --  94*  --   --  93*  --   --   --   CO2 25  --  26  --  27  --  26  --   --  27  --   --   --   GLUCOSE 300*  --  276*  --  193*  --  212*  --   --  149*  --   --   --   BUN 30*  --  28*  --  25*  --  28*  --   --  32*  --   --   --   CREATININE 0.98  --  0.90  --  0.81  --  0.88  --   --  0.96  --   --   --   CALCIUM 7.5*  --  7.3*  --  7.6*  --  7.5*  --   --  7.7*  --   --   --   MG  --   --  1.9  --   --   --   --   --   --   --   --    --   --   < > = values in this interval not displayed.  Liver Function Tests:  Recent Labs Lab 06/26/15 1537  AST 26  ALT 14  ALKPHOS 71  BILITOT 0.5  PROT 5.4*  ALBUMIN 2.2*   No results for input(s): LIPASE, AMYLASE in the last 168 hours. No results for input(s): AMMONIA in the last 168 hours.  CBC:  Recent Labs Lab 06/26/15 0931 06/27/15 1309 06/28/15 0050 06/30/15 0543 07/01/15 0507  WBC 15.6* 8.5 7.6 12.4* 14.3*  HGB 8.4* 8.7* 8.7* 8.8* 9.0*  HCT 25.1* 26.7* 26.1* 27.3* 26.7*  MCV 84.5 85.8 84.6 86.1 84.9  PLT 392 316 289 246 224    Cardiac Enzymes:  Recent Labs Lab 06/25/15 1316 06/25/15 1935 06/29/15 0923 06/29/15 1446 06/29/15 2038  TROPONINI 0.04* 0.04* 0.42* 0.52* 0.57*    BNP: Invalid input(s): POCBNP  CBG:  Recent Labs Lab 07/01/15 0742 07/01/15 1150 07/01/15 1610 07/02/15 0805 07/02/15 1156  GLUCAP 135* 237* 235* 199* 212*    Microbiology: Results for orders placed or performed during the hospital encounter of 06/21/15  Stool culture     Status: None   Collection Time: 06/21/15  5:17 PM  Result Value Ref Range Status   Specimen Description STOOL  Final   Special Requests Normal  Final   Culture   Final    NO SALMONELLA OR SHIGELLA ISOLATED No Pathogenic E. coli detected NO CAMPYLOBACTER DETECTED    Report Status 06/23/2015 FINAL  Final  C difficile quick scan w PCR reflex ([facility] only)     Status: Abnormal   Collection Time: 06/21/15  5:17 PM  Result Value Ref Range Status   C Diff antigen POSITIVE (A) NEGATIVE Final   C Diff toxin POSITIVE (A) NEGATIVE Final   C Diff interpretation   Final    Positive for toxigenic C. difficile, active toxin production present.    Comment: CRITICAL RESULT CALLED TO, READ BACK BY AND VERIFIED WITH: DONALD SWEENEY ON 06/21/15 AT 1755 BY JEF   MRSA PCR Screening     Status: None   Collection Time: 06/25/15 10:48 AM  Result Value Ref Range Status   MRSA by PCR NEGATIVE NEGATIVE Final     Comment:        The GeneXpert MRSA Assay (FDA approved for NASAL specimens only), is one component of a comprehensive MRSA colonization surveillance program. It is not intended to diagnose MRSA infection nor to guide or monitor treatment for MRSA infections.     Coagulation Studies: No results for input(s): LABPROT, INR in the last 72 hours.  Urinalysis: No results for input(s): COLORURINE, LABSPEC, PHURINE, GLUCOSEU, HGBUR, BILIRUBINUR, KETONESUR, PROTEINUR, UROBILINOGEN, NITRITE, LEUKOCYTESUR in the last 72 hours.  Invalid input(s): APPERANCEUR    Imaging: Dg Abd 2 Views  07/01/2015   CLINICAL DATA:  Abdominal pain.  EXAM: ABDOMEN - 2 VIEW  COMPARISON:  06/29/2015.  FINDINGS: Partial improvement of small and large bowel distention suggesting improving adynamic ileus. No free air. Surgical clips right upper quadrant. Degenerative changes lumbar spine and both hips.  IMPRESSION: Partial improvement of small large bowel distention suggesting improving adynamic ileus.   Electronically Signed   By: Maisie Fus  Register   On: 07/01/2015 07:20     Medications:   . diltiazem (CARDIZEM) infusion Stopped (07/01/15 0400)   . acidophilus  1 capsule Oral BID  . antiseptic oral rinse  7 mL Mouth Rinse BID  . aspirin  81 mg Oral Daily  . atorvastatin  80 mg Oral QPM  . benazepril  40 mg Oral BID  . carvedilol  25 mg Oral BID  . digoxin  0.25 mg Intravenous Daily  . diltiazem  240 mg Oral Daily  . dorzolamide  1 drop Both Eyes TID  . famotidine  20 mg Oral Q supper  . feeding supplement (GLUCERNA SHAKE)  237 mL Oral TID WC  . furosemide  40 mg Intravenous Q12H  . heparin subcutaneous  5,000 Units Subcutaneous 3 times per day  . hydrALAZINE  100 mg Oral TID  . hydrocortisone  25 mg Rectal BID  . hydrocortisone cream   Topical QID  . insulin aspart  0-15 Units Subcutaneous TID WC  . insulin aspart  0-5 Units Subcutaneous QHS  . insulin glargine  15 Units Subcutaneous QHS  . latanoprost   1 drop Both Eyes QPM  . metoprolol tartrate  12.5 mg Oral Q6H  . multivitamin with minerals  1 tablet Oral q morning - 10a  . potassium chloride  20 mEq Oral Daily  . sertraline  50 mg Oral Daily  . timolol  1 drop Both Eyes BID  . vancomycin  250 mg Oral 4 times per day   albuterol, alum & mag hydroxide-simeth, diphenhydrAMINE, enalaprilat, hydrALAZINE, labetalol, morphine injection, ondansetron (ZOFRAN) IV, simethicone, traMADol  Assessment/ Plan:  [name] is a 81 y.o. [place] female with diabetes mellitus type II, hypertension, congestive heart failure, hyperlipidemia, coronary artery disease, atrial fibrillation, GERD, mitral valve disorder who was admitted to [facility] on 06/21/2015 for C. Diff colitis. Patient was found to have a serum sodium of 122. Baseline of 132 (05/03/2015).   1. Acute hyponatremia on chronic hyponatremia: hypervolemic on examination. Hypo-osmotic with measured osm of 257. Tolvaptan on 7/29 Seems that acute hyponatremia is hypervolemic while chronic hyponatremia could be due to congestive heart failure - Increased furosemie to 40mg  IV q12, continue serial sodium checks.  Will need fluid restriction -  Echocardiogram reviewed.   2. Hypokalemia: due to IV furosemide. May contributing to GI distension. At goal now at 4.2 - potassium chloride   3. Acute kidney injury : creatinine now back to baseline. GFR greater than 60.  however may be dilutional from volume overload. Acute renal failure secondary to prerenal azotemia. Chronic kidney disease seems to be age related.  - monitor volume status, urine output and renal function   4. Hypertension with atrial fibrillation with RVR:  - home regimen of benazepril, carvedilol, diltiazem, clonidine, furosemide, hydralazine - diltiazem gtt  - metoprolol started by cardiology along with dig.    LOS: 11 Debria Broecker 8/4/201612:20 PM

## 2015-07-02 NOTE — Care Management (Signed)
Patient transferred to 2A from ICU. She had been on cardizem drip in icu.  Was positive for C diff and has now been started on oral Vancomycin.   Her sodium levels over the last 3 days have ranged from 126 - 128.  Her heart rate has ranged between 117 - 36.  There are orders for IV Digoxin. Per CSW notes, patient will have a bed at [facility] when medically stable.  There are no orders for physical therapy.  Current activity order is for bed rest.  Discussed in progression

## 2015-07-02 NOTE — Progress Notes (Signed)
Notified Dr Anne Hahn of patient's blood pressure.  Advised to hold lasix, benazepril, hydralazine and metoprolol.  Told to give only carvedilol due to pt's HR.  Will continue to monitor.  Cristela Felt, RN

## 2015-07-02 NOTE — Progress Notes (Signed)
[facility] [place] at [facility]   PATIENT NAME: Katherine [name]    MR#:  [id number]  DATE OF BIRTH:  17-Sep-1932  SUBJECTIVE:  Diarrhea 4 times today, better Abdominal gas and distention, still Weakness. REVIEW OF SYSTEMS:   Review of Systems  Constitutional: Negative for fever, chills and weight loss.  HENT: Negative for ear discharge, ear pain and nosebleeds.   Eyes: Negative for blurred vision, pain and discharge.  Respiratory: Negative for sputum production, shortness of breath, wheezing and stridor.   Cardiovascular: Positive for leg swelling. Negative for chest pain, palpitations, orthopnea and PND.  Gastrointestinal: Positive for diarrhea. Negative for vomiting and blood in stool.  Genitourinary: Negative for urgency and frequency.  Musculoskeletal: Negative for back pain and joint pain.  Neurological: Positive for weakness. Negative for sensory change, speech change and focal weakness.  Psychiatric/Behavioral: Negative for depression. The patient is not nervous/anxious.   All other systems reviewed and are negative.  Tolerating Diet: not able to eat much, very nauseous and distended abdomen Tolerating PT: eval pending DRUG ALLERGIES:   Allergies  Allergen Reactions  . Benzocaine Other (See Comments)    Unknown reaction  . Contrast Media [Iodinated Diagnostic Agents] Other (See Comments)    Tachycardia, SVT  . Other Other (See Comments)    MRI dye--reaction unknown.  Marland Kitchen Penicillin V Potassium Other (See Comments)  . Penicillins Other (See Comments)    Reaction: pt doesn't know, b/c it's been a long time ago.   . Sulfa Antibiotics Other (See Comments)    Reaction: pt doesn't know, b/c it's been a long time ago.    VITALS:  Blood pressure 155/54, pulse 82, temperature 97.6 F (36.4 C), temperature source Oral, resp. rate 22, height 5\' 3"  (1.6 m), weight 97.251 kg (214 lb 6.4 oz), SpO2 98 %. PHYSICAL EXAMINATION:  Physical Exam   Constitutional: She is oriented to person, place, and time and well-developed, well-nourished, and in no distress.  HENT:  Head: Normocephalic and atraumatic.  Eyes: Conjunctivae and EOM are normal. Pupils are equal, round, and reactive to light.  Neck: Normal range of motion. Neck supple. No tracheal deviation present. No thyromegaly present.  Cardiovascular: Normal rate, regular rhythm and normal heart sounds.   Pulmonary/Chest: Effort normal and breath sounds normal. No respiratory distress. She has no wheezes. She exhibits no tenderness.  Abdominal: Soft. Bowel sounds are normal. She exhibits distension (gaseous). There is tenderness.  Musculoskeletal: Normal range of motion. She exhibits edema.  Neurological: She is alert and oriented to person, place, and time. No cranial nerve deficit.  Skin: Skin is warm and dry. No rash noted.  Psychiatric: Mood and affect normal.   LABORATORY PANEL:   CBC  Recent Labs Lab 07/02/15 0847  WBC 20.5*  HGB 9.4*  HCT 29.2*  PLT 229    Chemistries   Recent Labs Lab 06/26/15 1537  06/28/15 0050  07/02/15 0847  NA 117*  < > 127*  < > 130*  129*  K 4.3  < > 3.1*  < > 3.8  CL 86*  < > 94*  < > 94*  CO2 23  < > 26  < > 29  GLUCOSE 162*  < > 276*  < > 197*  BUN 29*  < > 28*  < > 31*  CREATININE 0.93  < > 0.90  < > 0.87  CALCIUM 7.5*  < > 7.3*  < > 7.6*  MG  --   --  1.9  --   --   AST 26  --   --   --   --   ALT 14  --   --   --   --   ALKPHOS 71  --   --   --   --   BILITOT 0.5  --   --   --   --   < > = values in this interval not displayed.  Cardiac Enzymes  Recent Labs Lab 06/29/15 2038  TROPONINI 0.57*   RADIOLOGY:  Dg Abd 2 Views  07/01/2015   CLINICAL DATA:  Abdominal pain.  EXAM: ABDOMEN - 2 VIEW  COMPARISON:  06/29/2015.  FINDINGS: Partial improvement of small and large bowel distention suggesting improving adynamic ileus. No free air. Surgical clips right upper quadrant. Degenerative changes lumbar spine and both  hips.  IMPRESSION: Partial improvement of small large bowel distention suggesting improving adynamic ileus.   Electronically Signed   By: Maisie Fus  Register   On: 07/01/2015 07:20   ASSESSMENT AND PLAN:  81 yr old W F with multiple medical problmes got out of rehab after having treated with IV abxs for Orbital cellulitis. Presented after a fall at home and ongoing diarrhea.   1. C. Diff: Collitis: started to have diarrhea again yesterday. Leukocytosis is worse  continue PO vancomycin, Flagyl andimipenem were discontinued .  * GIB possible due to hemmoroid: advanced to heart healthy and ADA diet. continue ASA, heparin SQ, Hb is stable.   2.  Acute hyponatremia on chronic hyponatremia: likely due to SIADH, given one dose of tolvaptan on July 29., improving but worse for 2 days. Increased lasix to 40 mg bid per Dr. Wynelle Link, Follow-up BMP. Solium is better.  3. DM. Blood sugar is better, continue Lantus and Continue sliding scale.  Hypoglycemia: Improved.  4. A-fib with RVR.   off carddizem drip, continue ASA,  Continue carvedilol, diltiazem and lopreesor per Dr. Juliann Pares. Echo shows normal EF at 60%.  5.left elbow pain after fall with abnromal xray. Improved. -appreciate ortho input - no pathology  6. Acute kidney injury on chronic kidney disease stage III: improved and stable.  7. Hypertension:    continue benazepril, carvedilol, diltiazem, lopressor, clonidine, furosemide, hydralazine  IV hydralazine prn.  * Pleural effusion.  from volume overload and sepsis. continue Lasix  * Chronic diastolic CHF ( EF at 60-65%). Unclear etiology. Continue lasix and  Benazepril.  * Ileus. Secondary to severe C. difficile colitis.  Improving per abd xray. Advanced diet.  PT Evaluation suggested skilled nursing facility placement.  Discussed with Dr. Wynelle Link. Case discussed with Care Management/Social Worker.  CODE STATUS: full  DVT Prophylaxis: heparin  TOTAL TIME TAKING CARE OF THIS  PATIENT: 42 minutes.   >50% time spent on counselling and coordination of care.  POSSIBLE D/C IN 2-3 DAYS, DEPENDING ON CLINICAL CONDITION.   Shaune Pollack M.D on 07/02/2015 at 2:01 PM  Between 7am to 6pm - Pager - [phone]  After 6pm go to [email or web address] - password [email or web address] [facility]  [facility] [facility]  Office  [phone]  CC: Primary care physician; Mila Merry, MD

## 2015-07-03 LAB — CBC
HCT: 27.9 % — ABNORMAL LOW (ref 35.0–47.0)
HEMOGLOBIN: 9.1 g/dL — AB (ref 12.0–16.0)
MCH: 27.9 pg (ref 26.0–34.0)
MCHC: 32.5 g/dL (ref 32.0–36.0)
MCV: 85.6 fL (ref 80.0–100.0)
Platelets: 199 10*3/uL (ref 150–440)
RBC: 3.26 MIL/uL — AB (ref 3.80–5.20)
RDW: 15.6 % — ABNORMAL HIGH (ref 11.5–14.5)
WBC: 17.2 10*3/uL — ABNORMAL HIGH (ref 3.6–11.0)

## 2015-07-03 LAB — GLUCOSE, CAPILLARY
GLUCOSE-CAPILLARY: 161 mg/dL — AB (ref 65–99)
GLUCOSE-CAPILLARY: 211 mg/dL — AB (ref 65–99)
Glucose-Capillary: 219 mg/dL — ABNORMAL HIGH (ref 65–99)
Glucose-Capillary: 186 mg/dL — ABNORMAL HIGH (ref 65–99)
Glucose-Capillary: 194 mg/dL — ABNORMAL HIGH (ref 65–99)

## 2015-07-03 LAB — BASIC METABOLIC PANEL
Anion gap: 7 (ref 5–15)
BUN: 30 mg/dL — ABNORMAL HIGH (ref 6–20)
CO2: 30 mmol/L (ref 22–32)
Calcium: 7.9 mg/dL — ABNORMAL LOW (ref 8.9–10.3)
Chloride: 93 mmol/L — ABNORMAL LOW (ref 101–111)
Creatinine, Ser: 0.88 mg/dL (ref 0.44–1.00)
GFR calc Af Amer: >60 mL/min (ref >60)
GFR, EST NON AFRICAN AMERICAN: 60 mL/min — AB (ref >60)
Glucose, Bld: 179 mg/dL — ABNORMAL HIGH (ref 65–99)
Potassium: 4.1 mmol/L (ref 3.5–5.1)
Sodium: 130 mmol/L — ABNORMAL LOW (ref 135–145)

## 2015-07-03 LAB — DIGOXIN LEVEL: Digoxin Level: 1.3 ng/mL (ref 0.8–2.0)

## 2015-07-03 MED ORDER — GLUCERNA SHAKE PO LIQD
237.0000 mL | Freq: Four times a day (QID) | ORAL | Status: DC
  Administered 2015-07-03 – 2015-07-13 (×29): 237 mL via ORAL

## 2015-07-03 MED ORDER — PRAMOXINE-ZINC OXIDE IN MO 1-12.5 % RE OINT
TOPICAL_OINTMENT | Freq: Three times a day (TID) | RECTAL | Status: DC | PRN
  Filled 2015-07-03 (×2): qty 28.3

## 2015-07-03 MED ORDER — PHENYLEPH-SHARK LIV OIL-MO-PET 0.25-3-14-71.9 % RE OINT
TOPICAL_OINTMENT | Freq: Three times a day (TID) | RECTAL | Status: DC | PRN
  Administered 2015-07-04: 14:00:00 via RECTAL
  Filled 2015-07-03: qty 28.4

## 2015-07-03 NOTE — Progress Notes (Signed)
[facility]  ROUNDING NOTE   Subjective:   Patient complaining of rectal pain.  No sodium level this morning Peripheral edema somewhat improved as per patient.   Objective:  Vital signs in last 24 hours:  Temp:  [97.5 F (36.4 C)-97.9 F (36.6 C)] 97.5 F (36.4 C) (08/05 0425) Pulse Rate:  [50-94] 87 (08/05 0906) Resp:  [16-22] 16 (08/05 0425) BP: (112-161)/(54-77) 161/77 mmHg (08/05 0906) SpO2:  [96 %-98 %] 98 % (08/05 0425)  Weight change:  Filed Weights   06/21/15 1508 07/02/15 0427  Weight: 96.616 kg (213 lb) 97.251 kg (214 lb 6.4 oz)    Intake/Output: I/O last 3 completed shifts: In: 594 [P.O.:594] Out: 1650 [Urine:1650]   Intake/Output this shift:     Physical Exam: General: NAD  Head: Normocephalic, atraumatic. Moist oral mucosal membranes  Eyes: Anicteric, PERRL  Neck: Supple, trachea midline  Lungs:  Clear to auscultation  Heart: Irregular  Abdomen:  Soft, tender to palpation, +bowel sounds  Extremities: 1+ peripheral and dependent edema in all four extremities  Neurologic: Nonfocal, moving all four extremities  Skin: No lesions       Basic Metabolic Panel:  Recent Labs Lab 06/28/15 0050  06/29/15 0705  06/30/15 0543  07/01/15 0507 07/01/15 0856 07/01/15 2112 07/02/15 0847 07/02/15 2041  NA 127*  < > 130*  < > 127*  < > 127* 127* 127* 130*  129* 128*  K 3.1*  --  3.4*  --  3.9  --  4.2  --   --  3.8  --   CL 94*  --  95*  --  94*  --  93*  --   --  94*  --   CO2 26  --  27  --  26  --  27  --   --  29  --   GLUCOSE 276*  --  193*  --  212*  --  149*  --   --  197*  --   BUN 28*  --  25*  --  28*  --  32*  --   --  31*  --   CREATININE 0.90  --  0.81  --  0.88  --  0.96  --   --  0.87  --   CALCIUM 7.3*  --  7.6*  --  7.5*  --  7.7*  --   --  7.6*  --   MG 1.9  --   --   --   --   --   --   --   --   --   --   < > = values in this interval not displayed.  Liver Function Tests:  Recent Labs Lab 06/26/15 1537  07/02/15 0848  AST 26  --   ALT 14  --   ALKPHOS 71  --   BILITOT 0.5  --   PROT 5.4*  --   ALBUMIN 2.2* 2.2*   No results for input(s): LIPASE, AMYLASE in the last 168 hours. No results for input(s): AMMONIA in the last 168 hours.  CBC:  Recent Labs Lab 06/28/15 0050 06/30/15 0543 07/01/15 0507 07/02/15 0847 07/03/15 0855  WBC 7.6 12.4* 14.3* 20.5* 17.2*  HGB 8.7* 8.8* 9.0* 9.4* 9.1*  HCT 26.1* 27.3* 26.7* 29.2* 27.9*  MCV 84.6 86.1 84.9 85.6 85.6  PLT 289 246 224 229 199    Cardiac Enzymes:  Recent Labs Lab 06/29/15 0923 06/29/15 1446 06/29/15 2038  TROPONINI 0.42* 0.52* 0.57*    BNP: Invalid input(s): POCBNP  CBG:  Recent Labs Lab 07/02/15 0805 07/02/15 1156 07/02/15 1618 07/02/15 2053 07/03/15 0754  GLUCAP 199* 212* 220* 243* 161*    Microbiology: Results for orders placed or performed during the hospital encounter of 06/21/15  Stool culture     Status: None   Collection Time: 06/21/15  5:17 PM  Result Value Ref Range Status   Specimen Description STOOL  Final   Special Requests Normal  Final   Culture   Final    NO SALMONELLA OR SHIGELLA ISOLATED No Pathogenic E. coli detected NO CAMPYLOBACTER DETECTED    Report Status 06/23/2015 FINAL  Final  C difficile quick scan w PCR reflex ([facility] only)     Status: Abnormal   Collection Time: 06/21/15  5:17 PM  Result Value Ref Range Status   C Diff antigen POSITIVE (A) NEGATIVE Final   C Diff toxin POSITIVE (A) NEGATIVE Final   C Diff interpretation   Final    Positive for toxigenic C. difficile, active toxin production present.    Comment: CRITICAL RESULT CALLED TO, READ BACK BY AND VERIFIED WITH: DONALD SWEENEY ON 06/21/15 AT 1755 BY JEF   MRSA PCR Screening     Status: None   Collection Time: 06/25/15 10:48 AM  Result Value Ref Range Status   MRSA by PCR NEGATIVE NEGATIVE Final    Comment:        The GeneXpert MRSA Assay (FDA approved for NASAL specimens only), is one component of  a comprehensive MRSA colonization surveillance program. It is not intended to diagnose MRSA infection nor to guide or monitor treatment for MRSA infections.     Coagulation Studies: No results for input(s): LABPROT, INR in the last 72 hours.  Urinalysis: No results for input(s): COLORURINE, LABSPEC, PHURINE, GLUCOSEU, HGBUR, BILIRUBINUR, KETONESUR, PROTEINUR, UROBILINOGEN, NITRITE, LEUKOCYTESUR in the last 72 hours.  Invalid input(s): APPERANCEUR    Imaging: No results found.   Medications:   . diltiazem (CARDIZEM) infusion Stopped (07/01/15 0400)   . acidophilus  1 capsule Oral BID  . antiseptic oral rinse  7 mL Mouth Rinse BID  . aspirin  81 mg Oral Daily  . atorvastatin  80 mg Oral QPM  . benazepril  40 mg Oral BID  . carvedilol  25 mg Oral BID  . digoxin  0.25 mg Oral Daily  . diltiazem  240 mg Oral Daily  . dorzolamide  1 drop Both Eyes TID  . famotidine  20 mg Oral Q supper  . feeding supplement (GLUCERNA SHAKE)  237 mL Oral TID WC  . furosemide  40 mg Intravenous Q12H  . heparin subcutaneous  5,000 Units Subcutaneous 3 times per day  . hydrALAZINE  100 mg Oral TID  . hydrocortisone  25 mg Rectal BID  . hydrocortisone cream   Topical QID  . insulin aspart  0-15 Units Subcutaneous TID WC  . insulin aspart  0-5 Units Subcutaneous QHS  . insulin glargine  20 Units Subcutaneous QHS  . latanoprost  1 drop Both Eyes QPM  . metoprolol tartrate  12.5 mg Oral Q6H  . multivitamin with minerals  1 tablet Oral q morning - 10a  . potassium chloride  20 mEq Oral Daily  . sertraline  50 mg Oral Daily  . timolol  1 drop Both Eyes BID  . vancomycin  250 mg Oral 4 times per day   albuterol, alum & mag hydroxide-simeth, diphenhydrAMINE, enalaprilat, hydrALAZINE, labetalol,  morphine injection, ondansetron (ZOFRAN) IV, pramoxine-mineral oil-zinc, simethicone, traMADol  Assessment/ Plan:  [name] is a 81 y.o. [place] female with diabetes mellitus type II,  hypertension, congestive heart failure, hyperlipidemia, coronary artery disease, atrial fibrillation, GERD, mitral valve disorder who was admitted to [facility] on 06/21/2015 for C. Diff colitis. Patient was found to have a serum sodium of 122. Baseline of 132 (05/03/2015).   1. Acute hyponatremia on chronic hyponatremia: hypervolemic on examination. Hypo-osmotic with measured osm of 257. Tolvaptan on 7/29 Seems that acute hyponatremia is hypervolemic while chronic hyponatremia could be due to congestive heart failure - Increased furosemide to  IV q12, continue serial sodium checks.   - continue fluid restriction  2. Edema: with albumin 2.2.  - continue furosemide.   3. Hypokalemia: due to IV furosemide. May contributing to GI distension. At goal - potassium chloride   4. Acute kidney injury : creatinine now back to baseline. GFR greater than 60.  however may be dilutional from volume overload. Acute renal failure secondary to prerenal azotemia.  - monitor volume status, urine output and renal function   5. Hypertension with atrial fibrillation with RVR:  - home regimen of benazepril, carvedilol, diltiazem, clonidine, furosemide, hydralazine - now off diltiazem - metoprolol started by cardiology along with dig for her atrial fibrillation  Will monitor labs over the weekend. Please call with questions.    LOS: 12 Uziah Sorter 8/5/20169:12 AM

## 2015-07-03 NOTE — Evaluation (Signed)
Physical Therapy Evaluation Patient Details Name: Katherine [name] MRN: [id number] DOB: [birth year] Today's Date: 07/03/2015   History of Present Illness  Pt is an 81 y.o. female admitted to the hospital with C-diff and acute renal failure.  Pt with recent hospital stay at [facility] for eye infection and heart problems and discharged to [facility].  Pt reports fall at [facility] and also fall after discharging to home (L knee and hip imaging negative for fracture; L elbow x-ray report concerning for fracture but per ortho consult:  benign exam of elbow, no specific tenderness, unlikely to have fracture, recommend activity as tolerated). Pt was then found to have rapid ventricular response which brought her to the ICU. Just recently transfered back to telemetry.   Clinical Impression  Pt presents with hx of CHF, A-fib, DM, GERD, mitral valve disorder, and chronic nausea. Examination reveals that pt is not able to perform basic bed mobility fully secondary to general weakness and pain. Transfers and ambulation were not appropriate this date. Pt is, however, very pleasant and willing to participate in therapy to her tolerance, which today was largely bed exercises. Pt will continue to benefit from skilled PT in order to address her deficits and return her to premorbid state.     Follow Up Recommendations SNF    Equipment Recommendations  3in1 (PT)    Recommendations for Other Services       Precautions / Restrictions Precautions Precautions: Fall Restrictions Weight Bearing Restrictions: No      Mobility  Bed Mobility Overal bed mobility: Needs Assistance Bed Mobility: Rolling Rolling: Mod assist         General bed mobility comments: Pt attempted to roll but stated too much pain and that she was too tired due to just getting morphine. Therefore bilateral reaching was added to exercise program  Transfers                 General transfer comment: Not appropriate this  date  Ambulation/Gait             General Gait Details: Not appropriate this date  Stairs            Wheelchair Mobility    Modified Rankin (Stroke Patients Only)       Balance                                             Pertinent Vitals/Pain      Home Living Family/patient expects to be discharged to:: Skilled nursing facility Living Arrangements: Spouse/significant other   Type of Home: House Home Access: Stairs to enter Entrance Stairs-Rails: Left Entrance Stairs-Number of Steps: 3 Home Layout: Able to live on main level with bedroom/bathroom Home Equipment: Walker - 2 wheels;Toilet riser      Prior Function Level of Independence: Independent with assistive device(s)         Comments: Pt using RW upon discharge from [facility] (was not using an AD prior to recent hospital admissions); takes sponge baths     Hand Dominance        Extremity/Trunk Assessment   Upper Extremity Assessment: Generalized weakness           Lower Extremity Assessment: Generalized weakness         Communication   Communication: No difficulties  Cognition Arousal/Alertness: Lethargic Behavior During Therapy: Anxious Overall Cognitive Status: Within Functional  Limits for tasks assessed                      General Comments      Exercises Other Exercises Other Exercises: Pt performed bilateral therapeutic exercise x10 reps with min assist to facilitate movement. Therapeutic exercise's included: ankle pumps, SAQ, bilateral reaching contralateral, quad sets, and glute sets. All exercises AROM or AAROM Other Exercises:         Assessment/Plan    PT Assessment Patient needs continued PT services  PT Diagnosis Difficulty walking;Generalized weakness   PT Problem List Decreased strength;Decreased activity tolerance;Decreased balance;Decreased mobility  PT Treatment Interventions DME instruction;Gait training;Stair training;Functional  mobility training;Therapeutic activities;Therapeutic exercise;Balance training;Patient/family education   PT Goals (Current goals can be found in the Care Plan section) Acute Rehab PT Goals Patient Stated Goal: To return  PT Goal Formulation: With patient Time For Goal Achievement: 07/07/15 Potential to Achieve Goals: Fair    Frequency Min 2X/week   Barriers to discharge        Co-evaluation               End of Session   Activity Tolerance: Patient limited by lethargy;Patient limited by fatigue;Patient limited by pain Patient left: with bed alarm set;in bed Nurse Communication: Mobility status         Time: 1610-9604 PT Time Calculation (min) (ACUTE ONLY): 12 min   Charges:         PT G CodesBenna Dunks July 26, 2015, 3:06 PM Benna Dunks, SPT. [phone]

## 2015-07-03 NOTE — Progress Notes (Addendum)
Nutrition Follow-up    INTERVENTION:  1) Meals/Snacks: cater to pt preferences; pt with many reported food allergies/intolerances, limiting options for po intake at present; pt could not think of anything on visit today that she would like to eat. May benefit from liberalizing diet 2) Medical Food Supplement Therapy: recommend continuing Glucerna as this the supplement pt prefers (likes Butter Pecan-only supplement we have in this flavor); will increase to QID 3) Feeding Assistance: pt very weak, may benefit from meal set-up with feeding assistance as needed  NUTRITION DIAGNOSIS:   Inadequate oral intake related to inability to eat, altered GI function, acute illness as evidenced by NPO status. Continues but being addressed via supplements, feeding assistance, preferences  GOAL:   Patient will meet greater than or equal to 90% of their needs   MONITOR:    (Energy Intake: diet progrsesion, Digestive System, Electrolyte/Renal Profile, Glucose Profile)  ASSESSMENT:    Pt very weak, complaining of burning pain on bottom on visit today   Diet Order:  Diet renal/carb modified with fluid restriction Diet-HS Snack?: Nothing; Room service appropriate?: Yes; Fluid consistency:: Thin   Energy Intake: pt ate jello and drank 100% of Glucerna at lunch today; pt assisted by Boneta Lucks CNA at lunch today as pt complaining of being shaky. Per Tacey Ruiz RN, pt only ate some fruit this AM, drank Glucerna. Pt reports poor appetite but does drink 100% of Glucerna Shakes. Recorded po intake 0-25% of meals  Skin:  Reviewed, no issues  Last BM:  Cdiff positive, loose stools  Electrolyte and Renal Profile:  Recent Labs Lab 06/28/15 0050  07/01/15 0507  07/02/15 0847 07/02/15 2041 07/03/15 0855  BUN 28*  < > 32*  --  31*  --  30*  CREATININE 0.90  < > 0.96  --  0.87  --  0.88  NA 127*  < > 127*  < > 130*  129* 128* 130*  K 3.1*  < > 4.2  --  3.8  --  4.1  MG 1.9  --   --   --   --   --   --   < > =  values in this interval not displayed. Glucose Profile:  Recent Labs  07/02/15 2053 07/03/15 0754 07/03/15 1132  GLUCAP 243* 161* 186*   Protein Profile:  Recent Labs Lab 06/26/15 1537 07/02/15 0848  ALBUMIN 2.2* 2.2*   Nutritional Anemia Profile:  CBC Latest Ref Rng 07/03/2015 07/02/2015 07/01/2015  WBC 3.6 - 11.0 K/uL 17.2(H) 20.5(H) 14.3(H)  Hemoglobin 12.0 - 16.0 g/dL 5.6(O) 1.3(Y) 8.6(V)  Hematocrit 35.0 - 47.0 % 27.9(L) 29.2(L) 26.7(L)  Platelets 150 - 440 K/uL 199 229 224    Meds: acidophilus, lasix, ss novolog, lantus, MVI,simethicone, zoloft  Height:   Ht Readings from Last 1 Encounters:  06/21/15  (1.6 m)    Weight:   Wt Readings from Last 1 Encounters:  07/02/15 214 lb 6.4 oz (97.251 kg)    Filed Weights   06/21/15 1508 07/02/15 0427  Weight: 213 lb (96.616 kg) 214 lb 6.4 oz (97.251 kg)     BMI:  Body mass index is 37.99 kg/(m^2).  Estimated Nutritional Needs:   Kcal:  1744-1889 kcals (BEE 1118, 1.3 AF, 1.2-1.3 IF) using IBW 52.3 kg  Protein:  62-73 g (1.2-1.4 g/kg)   Fluid:  1560-1820 mL (30-35 ml/kg)   HIGH Care Level  Romelle Starcher MS, RD, LDN [phone] Pager

## 2015-07-03 NOTE — Clinical Social Work Note (Signed)
CSW spoke to MD.  Per MD no weekend DC for pt.  She is not medically stable enough at this time.  CSW will continue to follow/

## 2015-07-03 NOTE — Evaluation (Signed)
Physical Therapy Re-Evaluation Patient Details Name: Katherine [name] MRN: [id number] DOB: July 21, 1932 Today's Date: 07/03/2015   History of Present Illness  Pt is an 81 y.o. female admitted to the hospital with C-diff and acute renal failure.  Pt with recent hospital stay at [facility] for eye infection and heart problems and discharged to [facility].  Pt reports fall at [facility] and also fall after discharging to home (L knee and hip imaging negative for fracture; L elbow x-ray report concerning for fracture but per ortho consult:  benign exam of elbow, no specific tenderness, unlikely to have fracture, recommend activity as tolerated). Pt then tested positive for C-diff which brought her to the ICU. Just recently transfered back to telemetry.   Clinical Impression  Pt presents with hx of CHF, A-fib, DM, GERD, mitral valve disorder, and chronic nausea. Examination reveals that pt is not able to perform basic bed mobility fully secondary to general weakness and pain. Transfers and ambulation were not appropriate this date. Pt is, however, very pleasant and willing to participate in therapy to her tolerance, which today was largely bed exercises. Pt will continue to benefit from skilled PT in order to address her deficits and return her to premorbid state.     Follow Up Recommendations SNF    Equipment Recommendations  3in1 (PT)    Recommendations for Other Services       Precautions / Restrictions Precautions Precautions: Fall Restrictions Weight Bearing Restrictions: No      Mobility  Bed Mobility Overal bed mobility: Needs Assistance Bed Mobility: Rolling Rolling: Mod assist         General bed mobility comments: Pt attempted to roll but stated too much pain and that she was too tired due to just getting morphine. Therefore bilateral reaching was added to exercise program  Transfers                 General transfer comment: Not appropriate this date  Ambulation/Gait              General Gait Details: Not appropriate this date  Stairs            Wheelchair Mobility    Modified Rankin (Stroke Patients Only)       Balance                                             Pertinent Vitals/Pain      Home Living Family/patient expects to be discharged to:: Skilled nursing facility Living Arrangements: Spouse/significant other   Type of Home: House Home Access: Stairs to enter Entrance Stairs-Rails: Left Entrance Stairs-Number of Steps: 3 Home Layout: Able to live on main level with bedroom/bathroom Home Equipment: Walker - 2 wheels;Toilet riser      Prior Function Level of Independence: Independent with assistive device(s)         Comments: Pt using RW upon discharge from [facility] (was not using an AD prior to recent hospital admissions); takes sponge baths     Hand Dominance        Extremity/Trunk Assessment   Upper Extremity Assessment: Generalized weakness           Lower Extremity Assessment: Generalized weakness         Communication   Communication: No difficulties  Cognition Arousal/Alertness: Lethargic Behavior During Therapy: Anxious Overall Cognitive Status: Within Functional Limits for tasks  assessed                      General Comments      Exercises Other Exercises Other Exercises: Pt performed bilateral therapeutic exercise x10 reps with min assist to facilitate movement. Therapeutic exercise's included: ankle pumps, SAQ, bilateral reaching contralateral, quad sets, and glute sets. All exercises AROM or AAROM Other Exercises:         Assessment/Plan    PT Assessment Patient needs continued PT services  PT Diagnosis Difficulty walking;Generalized weakness   PT Problem List Decreased strength;Decreased activity tolerance;Decreased balance;Decreased mobility  PT Treatment Interventions DME instruction;Gait training;Stair training;Functional mobility training;Therapeutic  activities;Therapeutic exercise;Balance training;Patient/family education   PT Goals (Current goals can be found in the Care Plan section) Acute Rehab PT Goals Patient Stated Goal: To return  PT Goal Formulation: With patient Time For Goal Achievement: 07/07/15 Potential to Achieve Goals: Fair    Frequency Min 2X/week   Barriers to discharge        Co-evaluation               End of Session   Activity Tolerance: Patient limited by lethargy;Patient limited by fatigue;Patient limited by pain Patient left: with bed alarm set;in bed Nurse Communication: Mobility status         Time: 9629-5284 PT Time Calculation (min) (ACUTE ONLY): 12 min   Charges:         PT G CodesBenna Dunks 15-Jul-2015, 2:11 PM  Benna Dunks, SPT. [phone]

## 2015-07-03 NOTE — Evaluation (Signed)
Physical Therapy Re-Evaluation Patient Details Name: Katherine [name] MRN: [id number] DOB: 1932/03/15 Today's Date: 07/03/2015   History of Present Illness  Pt is an 81 y.o. female admitted to the hospital with C-diff and acute renal failure.  Pt with recent hospital stay at [facility] for eye infection and heart problems and discharged to [facility].  Pt reports fall at [facility] and also fall after discharging to home (L knee and hip imaging negative for fracture; L elbow x-ray report concerning for fracture but per ortho consult:  benign exam of elbow, no specific tenderness, unlikely to have fracture, recommend activity as tolerated). Pt was then found to have rapid ventricular response which brought her to the ICU. Just recently transfered back to telemetry.   Clinical Impression  Pt presents with hx of CHF, A-fib, DM, GERD, mitral valve disorder, and chronic nausea. Examination reveals that pt is not able to perform basic bed mobility fully secondary to general weakness and pain. Transfers and ambulation were not appropriate this date. Pt is, however, very pleasant and willing to participate in therapy to her tolerance, which today was largely bed exercises. Pt will continue to benefit from skilled PT in order to address her deficits and return her to premorbid state.     Follow Up Recommendations SNF    Equipment Recommendations  3in1 (PT)    Recommendations for Other Services       Precautions / Restrictions Precautions Precautions: Fall Restrictions Weight Bearing Restrictions: No      Mobility  Bed Mobility Overal bed mobility: Needs Assistance Bed Mobility: Rolling Rolling: Mod assist         General bed mobility comments: Pt attempted to roll but stated too much pain and that she was too tired due to just getting morphine. Therefore bilateral reaching was added to exercise program  Transfers                 General transfer comment: Not appropriate this  date  Ambulation/Gait             General Gait Details: Not appropriate this date  Stairs            Wheelchair Mobility    Modified Rankin (Stroke Patients Only)       Balance                                             Pertinent Vitals/Pain      Home Living Family/patient expects to be discharged to:: Skilled nursing facility Living Arrangements: Spouse/significant other   Type of Home: House Home Access: Stairs to enter Entrance Stairs-Rails: Left Entrance Stairs-Number of Steps: 3 Home Layout: Able to live on main level with bedroom/bathroom Home Equipment: Walker - 2 wheels;Toilet riser      Prior Function Level of Independence: Independent with assistive device(s)         Comments: Pt using RW upon discharge from [facility] (was not using an AD prior to recent hospital admissions); takes sponge baths     Hand Dominance        Extremity/Trunk Assessment   Upper Extremity Assessment: Generalized weakness           Lower Extremity Assessment: Generalized weakness         Communication   Communication: No difficulties  Cognition Arousal/Alertness: Lethargic Behavior During Therapy: Anxious Overall Cognitive Status: Within Functional  Limits for tasks assessed                      General Comments      Exercises Other Exercises Other Exercises: Pt performed bilateral therapeutic exercise x10 reps with min assist to facilitate movement. Therapeutic exercise's included: ankle pumps, SAQ, bilateral reaching contralateral, quad sets, and glute sets. All exercises AROM or AAROM Other Exercises:         Assessment/Plan    PT Assessment Patient needs continued PT services  PT Diagnosis Difficulty walking;Generalized weakness   PT Problem List Decreased strength;Decreased activity tolerance;Decreased balance;Decreased mobility  PT Treatment Interventions DME instruction;Gait training;Stair training;Functional  mobility training;Therapeutic activities;Therapeutic exercise;Balance training;Patient/family education   PT Goals (Current goals can be found in the Care Plan section) Acute Rehab PT Goals Patient Stated Goal: To return  PT Goal Formulation: With patient Time For Goal Achievement: 07/21/15 Potential to Achieve Goals: Fair    Frequency Min 2X/week   Barriers to discharge        Co-evaluation               End of Session   Activity Tolerance: Patient limited by lethargy;Patient limited by fatigue;Patient limited by pain Patient left: with bed alarm set;in bed Nurse Communication: Mobility status         Time: 0981-1914 PT Time Calculation (min) (ACUTE ONLY): 12 min   Charges:   PT Evaluation $PT Re-evaluation: 1 Procedure     PT G CodesBenna Dunks 17-Jul-2015, 4:04 PM  Benna Dunks, SPT. [phone]

## 2015-07-03 NOTE — Progress Notes (Signed)
[facility] - [place] [facility]   PATIENT NAME: Katherine [name]    MR#:  [id number]  DATE OF BIRTH:  1943-01-03  SUBJECTIVE:  Watery diarrhea multiple times times today, better Abdominal gas and distention, still Weakness. Still extremity swelling. burning pain on bottom. REVIEW OF SYSTEMS:   Review of Systems  Constitutional: Negative for fever, chills and weight loss.  HENT: Negative for ear discharge, ear pain and nosebleeds.   Eyes: Negative for blurred vision, pain and discharge.  Respiratory: Negative for sputum production, shortness of breath, wheezing and stridor.   Cardiovascular: Positive for leg swelling. Negative for chest pain, palpitations, orthopnea and PND.  Gastrointestinal: Positive for diarrhea. Negative for vomiting and blood in stool.  Genitourinary: Negative for urgency and frequency.  Musculoskeletal: Negative for back pain and joint pain.  Neurological: Positive for weakness. Negative for sensory change, speech change and focal weakness.  Psychiatric/Behavioral: Negative for depression. The patient is not nervous/anxious.   All other systems reviewed and are negative.  Tolerating Diet: not able to eat much, very nauseous and distended abdomen Tolerating PT: eval pending DRUG ALLERGIES:   Allergies  Allergen Reactions  . Benzocaine Other (See Comments)    Unknown reaction  . Contrast Media [Iodinated Diagnostic Agents] Other (See Comments)    Tachycardia, SVT  . Other Other (See Comments)    MRI dye--reaction unknown.  Marland Kitchen Penicillin V Potassium Other (See Comments)  . Penicillins Other (See Comments)    Reaction: pt doesn't know, b/c it's been a long time ago.   . Sulfa Antibiotics Other (See Comments)    Reaction: pt doesn't know, b/c it's been a long time ago.    VITALS:  Blood pressure 152/69, pulse 75, temperature 97.7 F (36.5 C), temperature source Oral, resp. rate 19, height  (1.6 m), weight 97.251 kg (214 lb  6.4 oz), SpO2 98 %. PHYSICAL EXAMINATION:  Physical Exam  Constitutional: She is oriented to person, place, and time and well-developed, well-nourished, and in no distress.  HENT:  Head: Normocephalic and atraumatic.  Eyes: Conjunctivae and EOM are normal. Pupils are equal, round, and reactive to light.  Neck: Normal range of motion. Neck supple. No tracheal deviation present. No thyromegaly present.  Cardiovascular: Normal rate, regular rhythm and normal heart sounds.   Pulmonary/Chest: Effort normal and breath sounds normal. No respiratory distress. She has no wheezes. She exhibits no tenderness.  Abdominal: Soft. Bowel sounds are normal. She exhibits distension (gaseous). There is tenderness.  Musculoskeletal: Normal range of motion. She exhibits edema.  Neurological: She is alert and oriented to person, place, and time. No cranial nerve deficit.  Skin: Skin is warm and dry. No rash noted.  Psychiatric: Mood and affect normal.   LABORATORY PANEL:   CBC  Recent Labs Lab 07/03/15 0855  WBC 17.2*  HGB 9.1*  HCT 27.9*  PLT 199    Chemistries   Recent Labs Lab 06/26/15 1537  06/28/15 0050  07/03/15 0855  NA 117*  < > 127*  < > 130*  K 4.3  < > 3.1*  < > 4.1  CL 86*  < > 94*  < > 93*  CO2 23  < > 26  < > 30  GLUCOSE 162*  < > 276*  < > 179*  BUN 29*  < > 28*  < > 30*  CREATININE 0.93  < > 0.90  < > 0.88  CALCIUM 7.5*  < > 7.3*  < > 7.9*  MG  --   --  1.9  --   --   AST 26  --   --   --   --   ALT 14  --   --   --   --   ALKPHOS 71  --   --   --   --   BILITOT 0.5  --   --   --   --   < > = values in this interval not displayed.  Cardiac Enzymes  Recent Labs Lab 06/29/15 2038  TROPONINI 0.57*   RADIOLOGY:  No results found. ASSESSMENT AND PLAN:  81 yr old W F with multiple medical problmes got out of rehab after having treated with IV abxs for Orbital cellulitis. Presented after a fall at home and ongoing diarrhea.   1. C. Diff: Collitis: still watery  diarrhea for 3 days, Leukocytosis is better.  continue PO vancomycin, Flagyl and imipenem were discontinued. Per Dr. Marva Panda, place her on florastor but we do not have florastor in house.  the patient is not a candidate for stool transplant.  * GIB possible due to hemmoroid: advanced to heart healthy and ADA diet. continue ASA, heparin SQ, Hb is stable.   2.  Acute hyponatremia on chronic hyponatremia: likely due to SIADH, given one dose of tolvaptan on July 29., improving but worse for 2 days. Increased lasix to 40 mg bid per Dr. Wynelle Link, Na is up to 130 today, Follow-up BMP.  3. DM. Blood sugar is better, continue Lantus and Continue sliding scale.  Hypoglycemia: Improved.  4. A-fib with RVR.   off carddizem drip for 2 days, continue ASA,  Continue carvedilol, diltiazem and lopreesor per Dr. Juliann Pares. Echo shows normal EF at 60%.  5.left elbow pain after fall with abnromal xray. Improved. -appreciate ortho input - no pathology  6. Acute kidney injury on chronic kidney disease stage III: improved and stable.  7. Hypertension:    continue benazepril, carvedilol, diltiazem, lopressor, clonidine, furosemide, hydralazine  IV hydralazine prn.  * Pleural effusion.  from volume overload and sepsis. continue Lasix  * Acute on chronic diastolic CHF (EF at 60-65%). Unclear etiology. Continue lasix and  Benazepril.  * Ileus. Secondary to severe C. difficile colitis.  Improving per abd xray. Advanced diet.  PT Evaluation suggested skilled nursing facility placement.  Discussed with Dr. Wynelle Link. Case discussed with Care Management/Social Worker. >50% time spent on counselling and coordination of care.  CODE STATUS: full  DVT Prophylaxis: heparin  TOTAL TIME TAKING CARE OF THIS PATIENT: 38 minutes.     POSSIBLE D/C IN 3 or more DAYS, DEPENDING ON CLINICAL CONDITION.   Shaune Pollack M.D on 07/03/2015 at 2:25 PM  Between 7am to 6pm - Pager - [phone]  After 6pm go to [email or web address]  - password [email or web address] [facility]  [facility]  Office  [phone]  CC: Primary care physician; Mila Merry, MD

## 2015-07-03 NOTE — Consult Note (Signed)
Subjective: Patient seen for Clostridium difficile colitis/diarrhea. Patient has been hemodynamically stable. Her bowel movements today have been fewer less in volume and somewhat more formed per nursing. She denies any abdominal pain. Her main complaint is her rectal discomfort and burning.  Objective: Vital signs in last 24 hours: Temp:  [97.5 F (36.4 C)-97.9 F (36.6 C)] 97.7 F (36.5 C) (08/05 1135) Pulse Rate:  [50-94] 75 (08/05 1135) Resp:  [16-19] 19 (08/05 1135) BP: (112-161)/(56-77) 152/69 mmHg (08/05 1135) SpO2:  [96 %-98 %] 98 % (08/05 1135) Blood pressure 152/69, pulse 75, temperature 97.7 F (36.5 C), temperature source Oral, resp. rate 19, height 5\' 3"  (1.6 m), weight 97.251 kg (214 lb 6.4 oz), SpO2 98 %.   Intake/Output from previous day: 08/04 0701 - 08/05 0700 In: 594 [P.O.:594] Out: 750 [Urine:750]  Intake/Output this shift:     General appearance:  Elderly female no acute distress Resp:  Clear to auscultation Cardio:  Irregularly irregular GI:  Soft nontender nondistended bowel sounds positive and normoactive Extremities:     Lab Results: Results for orders placed or performed during the hospital encounter of 06/21/15 (from the past 24 hour(s))  Sodium     Status: Abnormal   Collection Time: 07/02/15  8:41 PM  Result Value Ref Range   Sodium 128 (L) 135 - 145 mmol/L  Glucose, capillary     Status: Abnormal   Collection Time: 07/02/15  8:53 PM  Result Value Ref Range   Glucose-Capillary 243 (H) 65 - 99 mg/dL   Comment 1 Notify RN   Glucose, capillary     Status: Abnormal   Collection Time: 07/03/15  7:54 AM  Result Value Ref Range   Glucose-Capillary 161 (H) 65 - 99 mg/dL  Digoxin level     Status: None   Collection Time: 07/03/15  8:55 AM  Result Value Ref Range   Digoxin Level 1.3 0.8 - 2.0 ng/mL  CBC     Status: Abnormal   Collection Time: 07/03/15  8:55 AM  Result Value Ref Range   WBC 17.2 (H) 3.6 - 11.0 K/uL   RBC 3.26 (L) 3.80 - 5.20  MIL/uL   Hemoglobin 9.1 (L) 12.0 - 16.0 g/dL   HCT 16.1 (L) 09.6 - 04.5 %   MCV 85.6 80.0 - 100.0 fL   MCH 27.9 26.0 - 34.0 pg   MCHC 32.5 32.0 - 36.0 g/dL   RDW 40.9 (H) 81.1 - 91.4 %   Platelets 199 150 - 440 K/uL  Basic metabolic panel     Status: Abnormal   Collection Time: 07/03/15  8:55 AM  Result Value Ref Range   Sodium 130 (L) 135 - 145 mmol/L   Potassium 4.1 3.5 - 5.1 mmol/L   Chloride 93 (L) 101 - 111 mmol/L   CO2 30 22 - 32 mmol/L   Glucose, Bld 179 (H) 65 - 99 mg/dL   BUN 30 (H) 6 - 20 mg/dL   Creatinine, Ser 7.82 0.44 - 1.00 mg/dL   Calcium 7.9 (L) 8.9 - 10.3 mg/dL   GFR calc non Af Amer 60 (L) >60 mL/min   GFR calc Af Amer >60 >60 mL/min   Anion gap 7 5 - 15  Glucose, capillary     Status: Abnormal   Collection Time: 07/03/15 11:32 AM  Result Value Ref Range   Glucose-Capillary 186 (H) 65 - 99 mg/dL  Glucose, capillary     Status: Abnormal   Collection Time: 07/03/15  5:00 PM  Result  Value Ref Range   Glucose-Capillary 211 (H) 65 - 99 mg/dL      Recent Labs  78/29/56 0507 07/02/15 0847 07/03/15 0855  WBC 14.3* 20.5* 17.2*  HGB 9.0* 9.4* 9.1*  HCT 26.7* 29.2* 27.9*  PLT 224 229 199   BMET  Recent Labs  07/01/15 0507  07/02/15 0847 07/02/15 2041 07/03/15 0855  NA 127*  < > 130*  129* 128* 130*  K 4.2  --  3.8  --  4.1  CL 93*  --  94*  --  93*  CO2 27  --  29  --  30  GLUCOSE 149*  --  197*  --  179*  BUN 32*  --  31*  --  30*  CREATININE 0.96  --  0.87  --  0.88  CALCIUM 7.7*  --  7.6*  --  7.9*  < > = values in this interval not displayed. LFT  Recent Labs  07/02/15 0848  ALBUMIN 2.2*   PT/INR No results for input(s): LABPROT, INR in the last 72 hours. Hepatitis Panel No results for input(s): HEPBSAG, HCVAB, HEPAIGM, HEPBIGM in the last 72 hours. C-Diff No results for input(s): CDIFFTOX in the last 72 hours. No results for input(s): CDIFFPCR in the last 72 hours.   Studies/Results: No results found.  Scheduled Inpatient  Medications:   . acidophilus  1 capsule Oral BID  . antiseptic oral rinse  7 mL Mouth Rinse BID  . aspirin  81 mg Oral Daily  . atorvastatin  80 mg Oral QPM  . benazepril  40 mg Oral BID  . carvedilol  25 mg Oral BID  . digoxin  0.25 mg Oral Daily  . diltiazem  240 mg Oral Daily  . dorzolamide  1 drop Both Eyes TID  . famotidine  20 mg Oral Q supper  . feeding supplement (GLUCERNA SHAKE)  237 mL Oral QID  . furosemide  40 mg Intravenous Q12H  . heparin subcutaneous  5,000 Units Subcutaneous 3 times per day  . hydrALAZINE  100 mg Oral TID  . hydrocortisone  25 mg Rectal BID  . hydrocortisone cream   Topical QID  . insulin aspart  0-15 Units Subcutaneous TID WC  . insulin aspart  0-5 Units Subcutaneous QHS  . insulin glargine  20 Units Subcutaneous QHS  . latanoprost  1 drop Both Eyes QPM  . metoprolol tartrate  12.5 mg Oral Q6H  . multivitamin with minerals  1 tablet Oral q morning - 10a  . potassium chloride  20 mEq Oral Daily  . sertraline  50 mg Oral Daily  . timolol  1 drop Both Eyes BID  . vancomycin  250 mg Oral 4 times per day    Continuous Inpatient Infusions:   . diltiazem (CARDIZEM) infusion Stopped (07/01/15 0400)    PRN Inpatient Medications:  albuterol, alum & mag hydroxide-simeth, diphenhydrAMINE, enalaprilat, hydrALAZINE, labetalol, morphine injection, ondansetron (ZOFRAN) IV, phenylephrine-shark liver oil-mineral oil-petrolatum, simethicone, traMADol  Miscellaneous:   Assessment:  1. C. difficile diarrhea slow improvement.  Plan:  1. Discussed with Dr. Wynelle Link. Will trial some low-dose albumen, 12.5 mg IV daily, beginning tomorrow. Continue current  Christena Deem MD 07/03/2015, 8:20 PM

## 2015-07-04 LAB — BASIC METABOLIC PANEL WITH GFR
Anion gap: 8 (ref 5–15)
BUN: 30 mg/dL — ABNORMAL HIGH (ref 6–20)
CO2: 29 mmol/L (ref 22–32)
Calcium: 7.6 mg/dL — ABNORMAL LOW (ref 8.9–10.3)
Chloride: 92 mmol/L — ABNORMAL LOW (ref 101–111)
Creatinine, Ser: 0.81 mg/dL (ref 0.44–1.00)
GFR calc Af Amer: >60 mL/min
GFR calc non Af Amer: >60 mL/min
Glucose, Bld: 198 mg/dL — ABNORMAL HIGH (ref 65–99)
Potassium: 3.9 mmol/L (ref 3.5–5.1)
Sodium: 129 mmol/L — ABNORMAL LOW (ref 135–145)

## 2015-07-04 LAB — GLUCOSE, CAPILLARY
Glucose-Capillary: 169 mg/dL — ABNORMAL HIGH (ref 65–99)
Glucose-Capillary: 192 mg/dL — ABNORMAL HIGH (ref 65–99)
Glucose-Capillary: 182 mg/dL — ABNORMAL HIGH (ref 65–99)
Glucose-Capillary: 256 mg/dL — ABNORMAL HIGH (ref 65–99)

## 2015-07-04 LAB — CBC
HCT: 25.9 % — ABNORMAL LOW (ref 35.0–47.0)
Hemoglobin: 8.7 g/dL — ABNORMAL LOW (ref 12.0–16.0)
MCH: 28.5 pg (ref 26.0–34.0)
MCHC: 33.5 g/dL (ref 32.0–36.0)
MCV: 85.3 fL (ref 80.0–100.0)
Platelets: 181 K/uL (ref 150–440)
RBC: 3.04 MIL/uL — ABNORMAL LOW (ref 3.80–5.20)
RDW: 15.6 % — ABNORMAL HIGH (ref 11.5–14.5)
WBC: 15.6 K/uL — ABNORMAL HIGH (ref 3.6–11.0)

## 2015-07-04 MED ORDER — SIMETHICONE 80 MG PO CHEW
160.0000 mg | CHEWABLE_TABLET | Freq: Four times a day (QID) | ORAL | Status: DC | PRN
  Administered 2015-07-04 – 2015-07-07 (×2): 160 mg via ORAL
  Filled 2015-07-04 (×3): qty 2

## 2015-07-04 MED ORDER — ALBUMIN HUMAN 25 % IV SOLN
12.5000 g | Freq: Once | INTRAVENOUS | Status: AC
  Administered 2015-07-04: 12.5 g via INTRAVENOUS
  Filled 2015-07-04: qty 50

## 2015-07-04 MED ORDER — SACCHAROMYCES BOULARDII 250 MG PO CAPS
250.0000 mg | ORAL_CAPSULE | Freq: Two times a day (BID) | ORAL | Status: DC
  Administered 2015-07-04 – 2015-07-13 (×18): 250 mg via ORAL
  Filled 2015-07-04 (×20): qty 1

## 2015-07-04 MED ORDER — ZINC OXIDE 40 % EX OINT
TOPICAL_OINTMENT | CUTANEOUS | Status: AC | PRN
  Administered 2015-07-04 – 2015-07-11 (×3): via TOPICAL
  Filled 2015-07-04: qty 114
  Filled 2015-07-04: qty 56
  Filled 2015-07-04: qty 114

## 2015-07-04 NOTE — Progress Notes (Signed)
Order diaper cream per MD Elisabeth Pigeon

## 2015-07-04 NOTE — Consult Note (Signed)
Subjective: Patient seen for C. difficile colitis. Stools are decreasing in frequency and beginning to be more firm. She denies any nausea or abdominal pain. Her main complaint is still that of Peri rectal irritation  Objective: Vital signs in last 24 hours: Temp:  [97.5 F (36.4 C)-98.1 F (36.7 C)] 97.6 F (36.4 C) (08/06 1219) Pulse Rate:  [55-116] 116 (08/06 1219) Resp:  [18-20] 19 (08/06 1219) BP: (140-150)/(59-65) 146/61 mmHg (08/06 1219) SpO2:  [97 %-98 %] 98 % (08/06 1219) Blood pressure 146/61, pulse 116, temperature 97.6 F (36.4 C), temperature source Oral, resp. rate 19, height 5\' 3"  (1.6 m), weight 97.251 kg (214 lb 6.4 oz), SpO2 98 %.   Intake/Output from previous day: 08/05 0701 - 08/06 0700 In: 230 [P.O.:230] Out: 1775 [Urine:1775]  Intake/Output this shift: Total I/O In: -  Out: 450 [Urine:450]   General appearance:  Elderly, no acute distress Resp:  Clear to auscultation Cardio:  Irregularly irregular GI:  Soft nondistended obese, bowel sounds are positive normoactive, nontender Extremities:  2+ edema   Lab Results: Results for orders placed or performed during the hospital encounter of 06/21/15 (from the past 24 hour(s))  Glucose, capillary     Status: Abnormal   Collection Time: 07/03/15  9:13 PM  Result Value Ref Range   Glucose-Capillary 194 (H) 65 - 99 mg/dL   Comment 1 Notify RN   Basic metabolic panel     Status: Abnormal   Collection Time: 07/04/15 12:30 AM  Result Value Ref Range   Sodium 129 (L) 135 - 145 mmol/L   Potassium 3.9 3.5 - 5.1 mmol/L   Chloride 92 (L) 101 - 111 mmol/L   CO2 29 22 - 32 mmol/L   Glucose, Bld 198 (H) 65 - 99 mg/dL   BUN 30 (H) 6 - 20 mg/dL   Creatinine, Ser 1.61 0.44 - 1.00 mg/dL   Calcium 7.6 (L) 8.9 - 10.3 mg/dL   GFR calc non Af Amer >60 >60 mL/min   GFR calc Af Amer >60 >60 mL/min   Anion gap 8 5 - 15  CBC     Status: Abnormal   Collection Time: 07/04/15 12:30 AM  Result Value Ref Range   WBC 15.6 (H)  3.6 - 11.0 K/uL   RBC 3.04 (L) 3.80 - 5.20 MIL/uL   Hemoglobin 8.7 (L) 12.0 - 16.0 g/dL   HCT 09.6 (L) 04.5 - 40.9 %   MCV 85.3 80.0 - 100.0 fL   MCH 28.5 26.0 - 34.0 pg   MCHC 33.5 32.0 - 36.0 g/dL   RDW 81.1 (H) 91.4 - 78.2 %   Platelets 181 150 - 440 K/uL  Glucose, capillary     Status: Abnormal   Collection Time: 07/04/15  7:38 AM  Result Value Ref Range   Glucose-Capillary 169 (H) 65 - 99 mg/dL  Glucose, capillary     Status: Abnormal   Collection Time: 07/04/15 12:15 PM  Result Value Ref Range   Glucose-Capillary 192 (H) 65 - 99 mg/dL  Glucose, capillary     Status: Abnormal   Collection Time: 07/04/15  4:37 PM  Result Value Ref Range   Glucose-Capillary 182 (H) 65 - 99 mg/dL      Recent Labs  95/62/13 0847 07/03/15 0855 07/04/15 0030  WBC 20.5* 17.2* 15.6*  HGB 9.4* 9.1* 8.7*  HCT 29.2* 27.9* 25.9*  PLT 229 199 181   BMET  Recent Labs  07/02/15 0847 07/02/15 2041 07/03/15 0855 07/04/15 0030  NA 130*  129* 128* 130* 129*  K 3.8  --  4.1 3.9  CL 94*  --  93* 92*  CO2 29  --  30 29  GLUCOSE 197*  --  179* 198*  BUN 31*  --  30* 30*  CREATININE 0.87  --  0.88 0.81  CALCIUM 7.6*  --  7.9* 7.6*   LFT  Recent Labs  07/02/15 0848  ALBUMIN 2.2*   PT/INR No results for input(s): LABPROT, INR in the last 72 hours. Hepatitis Panel No results for input(s): HEPBSAG, HCVAB, HEPAIGM, HEPBIGM in the last 72 hours. C-Diff No results for input(s): CDIFFTOX in the last 72 hours. No results for input(s): CDIFFPCR in the last 72 hours.   Studies/Results: No results found.  Scheduled Inpatient Medications:   . acidophilus  1 capsule Oral BID  . antiseptic oral rinse  7 mL Mouth Rinse BID  . aspirin  81 mg Oral Daily  . atorvastatin  80 mg Oral QPM  . benazepril  40 mg Oral BID  . carvedilol  25 mg Oral BID  . digoxin  0.25 mg Oral Daily  . diltiazem  240 mg Oral Daily  . dorzolamide  1 drop Both Eyes TID  . famotidine  20 mg Oral Q supper  . feeding  supplement (GLUCERNA SHAKE)  237 mL Oral QID  . furosemide  40 mg Intravenous Q12H  . heparin subcutaneous  5,000 Units Subcutaneous 3 times per day  . hydrALAZINE  100 mg Oral TID  . hydrocortisone  25 mg Rectal BID  . hydrocortisone cream   Topical QID  . insulin aspart  0-15 Units Subcutaneous TID WC  . insulin aspart  0-5 Units Subcutaneous QHS  . insulin glargine  20 Units Subcutaneous QHS  . latanoprost  1 drop Both Eyes QPM  . metoprolol tartrate  12.5 mg Oral Q6H  . multivitamin with minerals  1 tablet Oral q morning - 10a  . potassium chloride  20 mEq Oral Daily  . sertraline  50 mg Oral Daily  . timolol  1 drop Both Eyes BID  . vancomycin  250 mg Oral 4 times per day    Continuous Inpatient Infusions:   . diltiazem (CARDIZEM) infusion Stopped (07/01/15 0400)    PRN Inpatient Medications:  albuterol, alum & mag hydroxide-simeth, diphenhydrAMINE, enalaprilat, hydrALAZINE, labetalol, liver oil-zinc oxide, morphine injection, ondansetron (ZOFRAN) IV, phenylephrine-shark liver oil-mineral oil-petrolatum, simethicone, traMADol  Miscellaneous:   Assessment:  1. C. difficile colitis slow improvement.  Plan:  1. Continue current, addition of albumen noted. Will also add florastor.  Christena Deem MD 07/04/2015, 5:53 PM

## 2015-07-04 NOTE — Progress Notes (Signed)
Per MD Elisabeth Pigeon order physical and occupational therapy

## 2015-07-04 NOTE — Progress Notes (Signed)
Per MD Elisabeth Pigeon order simethicone for gas pain

## 2015-07-04 NOTE — Progress Notes (Signed)
[facility] - [place] at [facility]   PATIENT NAME: Katherine [name]    MR#:  [id number]  DATE OF BIRTH:  11-Nov-1932  SUBJECTIVE:  Watery diarrhea multiple times times today, better Abdominal gas and distention, still Weakness. Still extremity swelling. burning pain on bottom. As per nursing- quality of stool is slowly improving. Pt is extremely weak. REVIEW OF SYSTEMS:   Review of Systems  Constitutional: Negative for fever, chills and weight loss.  HENT: Negative for ear discharge, ear pain and nosebleeds.   Eyes: Negative for blurred vision, pain and discharge.  Respiratory: Negative for sputum production, shortness of breath, wheezing and stridor.   Cardiovascular: Positive for leg swelling. Negative for chest pain, palpitations, orthopnea and PND.  Gastrointestinal: Positive for diarrhea. Negative for vomiting and blood in stool.  Genitourinary: Negative for urgency and frequency.  Musculoskeletal: Negative for back pain and joint pain.  Neurological: Positive for weakness. Negative for sensory change, speech change and focal weakness.  Psychiatric/Behavioral: Negative for depression. The patient is not nervous/anxious.   All other systems reviewed and are negative.  Tolerating Diet: not able to eat much, very nauseous and distended abdomen Tolerating PT: eval pending DRUG ALLERGIES:   Allergies  Allergen Reactions  . Benzocaine Other (See Comments)    Unknown reaction  . Contrast Media [Iodinated Diagnostic Agents] Other (See Comments)    Tachycardia, SVT  . Other Other (See Comments)    MRI dye--reaction unknown.  Marland Kitchen Penicillin V Potassium Other (See Comments)  . Penicillins Other (See Comments)    Reaction: pt doesn't know, b/c it's been a long time ago.   . Sulfa Antibiotics Other (See Comments)    Reaction: pt doesn't know, b/c it's been a long time ago.    VITALS:  Blood pressure 150/59, pulse 102, temperature 98.1 F (36.7 C), temperature  source Oral, resp. rate 18, height  (1.6 m), weight 97.251 kg (214 lb 6.4 oz), SpO2 97 %. PHYSICAL EXAMINATION:  Physical Exam  Constitutional: She is oriented to person, place, and time and well-developed, well-nourished, and in no distress.  HENT:  Head: Normocephalic and atraumatic.  Eyes: Conjunctivae and EOM are normal. Pupils are equal, round, and reactive to light.  Neck: Normal range of motion. Neck supple. No tracheal deviation present. No thyromegaly present.  Cardiovascular: Normal rate, regular rhythm and normal heart sounds.   Pulmonary/Chest: Effort normal and breath sounds normal. No respiratory distress. She has no wheezes. She exhibits no tenderness.  Abdominal: Soft. Bowel sounds are normal. She exhibits distension (gaseous). There is tenderness.  Musculoskeletal: Normal range of motion. She exhibits edema.  Neurological: She is alert and oriented to person, place, and time. No cranial nerve deficit.  Skin: Skin is warm and dry. No rash noted.  Psychiatric: Mood and affect normal.   LABORATORY PANEL:   CBC  Recent Labs Lab 07/04/15 0030  WBC 15.6*  HGB 8.7*  HCT 25.9*  PLT 181    Chemistries   Recent Labs Lab 06/28/15 0050  07/04/15 0030  NA 127*  < > 129*  K 3.1*  < > 3.9  CL 94*  < > 92*  CO2 26  < > 29  GLUCOSE 276*  < > 198*  BUN 28*  < > 30*  CREATININE 0.90  < > 0.81  CALCIUM 7.3*  < > 7.6*  MG 1.9  --   --   < > = values in this interval not displayed.  Cardiac Enzymes  Recent Labs Lab 06/29/15 2038  TROPONINI 0.57*   RADIOLOGY:  No results found. ASSESSMENT AND PLAN:  81 yr old W F with multiple medical problmes got out of rehab after having treated with IV abxs for Orbital cellulitis. Presented after a fall at home and ongoing diarrhea.   1. C. Diff: Collitis: still have diarrhea slightly better stool, Leukocytosis is better.  continue PO vancomycin, Flagyl and imipenem were discontinued. Per Dr. Marva Panda  the patient is not  a candidate for stool transplant.  * GIB possible due to hemmoroid: advanced to heart healthy and ADA diet. continue ASA, heparin SQ, Hb is stable.  2.  Acute hyponatremia on chronic hyponatremia: likely due to SIADH, given one dose of tolvaptan on July 29., i but worse for 2 days.so stopped, Increased lasix to 40 mg bid per Dr. Wynelle Link, Na is up to 130 , Follow-up BMP. We may give IV albumin to help with edema.  3. DM. Blood sugar is better, continue Lantus and Continue sliding scale.  Hypoglycemia: Improved.  4. A-fib with RVR.   off carddizem drip for 2 days, continue ASA,  Continue carvedilol, diltiazem and lopreesor per Dr. Juliann Pares. Echo shows normal EF at 60%.  5.left elbow pain after fall with abnromal xray. Improved. -appreciate ortho input - no pathology  6. Acute kidney injury on chronic kidney disease stage III: improved and stable.  7. Hypertension:    continue benazepril, carvedilol, diltiazem, lopressor, clonidine, furosemide, hydralazine  IV hydralazine prn.  * Pleural effusion.  from volume overload and sepsis. continue Lasix  * Acute on chronic diastolic CHF (EF at 60-65%). Unclear etiology. Continue lasix and  Benazepril.  * Ileus. Secondary to severe C. difficile colitis.  Improving per abd xray. Advanced diet.  PT Evaluation suggested skilled nursing facility placement.  Discussed with Dr. Wynelle Link. Case discussed with Care Management/Social Worker. >50% time spent on counselling and coordination of care.  CODE STATUS: full  DVT Prophylaxis: heparin  TOTAL TIME TAKING CARE OF THIS PATIENT: 35 minutes.    POSSIBLE D/C IN 3 or more DAYS, DEPENDING ON CLINICAL CONDITION.   Altamese Dilling M.D on 07/04/2015 at 11:16 AM  Between 7am to 6pm - Pager - [phone]  After 6pm go to [email or web address] - password [email or web address] [facility]  [facility]  Office  [phone]  CC: Primary care physician; Mila Merry, MD

## 2015-07-05 LAB — GLUCOSE, CAPILLARY
GLUCOSE-CAPILLARY: 144 mg/dL — AB (ref 65–99)
GLUCOSE-CAPILLARY: 214 mg/dL — AB (ref 65–99)
Glucose-Capillary: 164 mg/dL — ABNORMAL HIGH (ref 65–99)
Glucose-Capillary: 118 mg/dL — ABNORMAL HIGH (ref 65–99)

## 2015-07-05 LAB — URINALYSIS COMPLETE WITH MICROSCOPIC (ARMC ONLY)
Bilirubin Urine: NEGATIVE
Glucose, UA: NEGATIVE mg/dL
Ketones, ur: NEGATIVE mg/dL
NITRITE: NEGATIVE
PH: 9 — AB (ref 5.0–8.0)
Protein, ur: 100 mg/dL — AB
Specific Gravity, Urine: 1.009 (ref 1.005–1.030)

## 2015-07-05 MED ORDER — ALBUMIN HUMAN 25 % IV SOLN
12.5000 g | Freq: Two times a day (BID) | INTRAVENOUS | Status: DC
  Administered 2015-07-05 – 2015-07-12 (×15): 12.5 g via INTRAVENOUS
  Filled 2015-07-05 (×23): qty 50

## 2015-07-05 MED ORDER — FUROSEMIDE 10 MG/ML IJ SOLN
8.0000 mg/h | INTRAVENOUS | Status: DC
  Administered 2015-07-05 – 2015-07-12 (×6): 8 mg/h via INTRAVENOUS
  Filled 2015-07-05 (×5): qty 25

## 2015-07-05 NOTE — Progress Notes (Signed)
[facility] - [place] at [facility]   PATIENT NAME: Katherine [name]    MR#:  [id number]  DATE OF BIRTH:  25-Jul-1932  SUBJECTIVE:  Watery diarrhea multiple times times today, better Abdominal gas and distention, still Weakness. Still extremity swelling. burning pain on bottom. As per nursing- quality of stool is slowly improving. Pt is extremely weak.  Said " not feeling any better"  REVIEW OF SYSTEMS:   Review of Systems  Constitutional: Negative for fever, chills and weight loss.  HENT: Negative for ear discharge, ear pain and nosebleeds.   Eyes: Negative for blurred vision, pain and discharge.  Respiratory: Negative for sputum production, shortness of breath, wheezing and stridor.   Cardiovascular: Positive for leg swelling. Negative for chest pain, palpitations, orthopnea and PND.  Gastrointestinal: Positive for diarrhea. Negative for vomiting and blood in stool.  Genitourinary: Negative for urgency and frequency.  Musculoskeletal: Negative for back pain and joint pain.  Neurological: Positive for weakness. Negative for sensory change, speech change and focal weakness.  Psychiatric/Behavioral: Negative for depression. The patient is not nervous/anxious.   All other systems reviewed and are negative.  Tolerating Diet: not able to eat much, very nauseous and distended abdomen Tolerating PT: eval pending DRUG ALLERGIES:   Allergies  Allergen Reactions  . Benzocaine Other (See Comments)    Unknown reaction  . Contrast Media [Iodinated Diagnostic Agents] Other (See Comments)    Tachycardia, SVT  . Other Other (See Comments)    MRI dye--reaction unknown.  Marland Kitchen Penicillin V Potassium Other (See Comments)  . Penicillins Other (See Comments)    Reaction: pt doesn't know, b/c it's been a long time ago.   . Sulfa Antibiotics Other (See Comments)    Reaction: pt doesn't know, b/c it's been a long time ago.    VITALS:  Blood pressure 132/59, pulse 62,  temperature 97.3 F (36.3 C), temperature source Oral, resp. rate 14, height  (1.6 m), weight 97.251 kg (214 lb 6.4 oz), SpO2 95 %. PHYSICAL EXAMINATION:  Physical Exam  Constitutional: She is oriented to person, place, and time and well-developed, well-nourished, and in no distress.  HENT:  Head: Normocephalic and atraumatic.  Eyes: Conjunctivae and EOM are normal. Pupils are equal, round, and reactive to light.  Neck: Normal range of motion. Neck supple. No tracheal deviation present. No thyromegaly present.  Cardiovascular: Normal rate, regular rhythm and normal heart sounds.   Pulmonary/Chest: Effort normal and breath sounds normal. No respiratory distress. She has no wheezes. She exhibits no tenderness.  Abdominal: Soft. Bowel sounds are normal. She exhibits distension (gaseous). There is tenderness.  Musculoskeletal: Normal range of motion. She exhibits edema.  Neurological: She is alert and oriented to person, place, and time. No cranial nerve deficit.  Skin: Skin is warm and dry. No rash noted.  Psychiatric: Mood and affect normal.   LABORATORY PANEL:   CBC  Recent Labs Lab 07/04/15 0030  WBC 15.6*  HGB 8.7*  HCT 25.9*  PLT 181    Chemistries   Recent Labs Lab 07/04/15 0030  NA 129*  K 3.9  CL 92*  CO2 29  GLUCOSE 198*  BUN 30*  CREATININE 0.81  CALCIUM 7.6*    Cardiac Enzymes  Recent Labs Lab 06/29/15 2038  TROPONINI 0.57*   RADIOLOGY:  No results found. ASSESSMENT AND PLAN:  81 yr old W F with multiple medical problmes got out of rehab after having treated with IV abxs for Orbital cellulitis. Presented after a  fall at home and ongoing diarrhea.   1. C. Diff: Collitis: still have diarrhea slightly better stool, Leukocytosis is better.  continue PO vancomycin, Flagyl and imipenem were discontinued. Per Dr. Marva Panda  the patient is not a candidate for stool transplant.  * GIB possible due to hemmoroid: advanced to heart healthy and ADA diet.  continue ASA, heparin SQ, Hb is stable.  2.  Acute hyponatremia on chronic hyponatremia: likely due to SIADH, given one dose of tolvaptan on July 29., i but worse for 2 days.so stopped, Increased lasix to 40 mg bid per Dr. Wynelle Link, Na is up to 130 , Follow-up BMP. We may give IV albumin to help with edema. As swelling is not responding- started on IV lasix drip ( 07/05/15) by Nephro and albumin infusion twice daily.  3. DM. Blood sugar is better, continue Lantus and Continue sliding scale.  Hypoglycemia: Improved.  4. A-fib with RVR.   off carddizem drip for 2 days, continue ASA,  Continue carvedilol, diltiazem and lopreesor per Dr. Juliann Pares. Echo shows normal EF at 60%.  5.left elbow pain after fall with abnromal xray. Improved. -appreciate ortho input - no pathology  6. Acute kidney injury on chronic kidney disease stage III: improved and stable.  7. Hypertension:    continue benazepril, carvedilol, diltiazem, lopressor, clonidine, furosemide, hydralazine  IV hydralazine prn.  * Pleural effusion.  from volume overload and sepsis. continue Lasix  * Acute on chronic diastolic CHF (EF at 60-65%). Unclear etiology. Continue lasix and  Benazepril.  * Ileus. Secondary to severe C. difficile colitis.  Improving per abd xray. Advanced diet.  PT Evaluation suggested skilled nursing facility placement.  Discussed with Dr. Wynelle Link. Case discussed with Care Management/Social Worker. >50% time spent on counselling and coordination of care.  CODE STATUS: full  DVT Prophylaxis: heparin  TOTAL TIME TAKING CARE OF THIS PATIENT: 35 minutes.   POSSIBLE D/C IN 3-4 or more DAYS, DEPENDING ON CLINICAL CONDITION.   Altamese Dilling M.D on 07/05/2015 at 2:13 PM  Between 7am to 6pm - Pager - [phone]  After 6pm go to [email or web address] - password [email or web address] [facility]  [place] [facility]  Office  [phone]  CC: Primary care physician; Mila Merry, MD

## 2015-07-05 NOTE — Progress Notes (Signed)
[facility]  ROUNDING NOTE   Subjective:   Patient complaining of rectal pain.  Sodium level was 129 yesterday Patient continues to have massive anasarca Urine output recorded at 1450 cc   Objective:  Vital signs in last 24 hours:  Temp:  [97.6 F (36.4 C)-97.8 F (36.6 C)] 97.8 F (36.6 C) (08/07 0411) Pulse Rate:  [69-120] 88 (08/07 0411) Resp:  [18-20] 20 (08/07 0411) BP: (118-173)/(59-128) 173/69 mmHg (08/07 0411) SpO2:  [95 %-98 %] 95 % (08/07 0411)  Weight change:  Filed Weights   06/21/15 1508 07/02/15 0427  Weight: 96.616 kg (213 lb) 97.251 kg (214 lb 6.4 oz)    Intake/Output: I/O last 3 completed shifts: In: -  Out: 2632 [Urine:2625; Stool:7]   Intake/Output this shift:  Total I/O In: -  Out: 50 [Urine:50]  Physical Exam: General: NAD  Head: Normocephalic, atraumatic. Moist oral mucosal membranes  Eyes: Anicteric,  Neck: Supple, trachea midline  Lungs:  Clear to auscultation  Heart: Irregular  Abdomen:  Soft, tender to palpation, +bowel sounds, mildly distended   Extremities: 3+ peripheral and dependent edema in all four extremities  Neurologic: Nonfocal, moving all four extremities  Skin: No acute lesions       Basic Metabolic Panel:  Recent Labs Lab 06/30/15 0543  07/01/15 0507  07/01/15 2112 07/02/15 0847 07/02/15 2041 07/03/15 0855 07/04/15 0030  NA 127*  < > 127*  < > 127* 130*  129* 128* 130* 129*  K 3.9  --  4.2  --   --  3.8  --  4.1 3.9  CL 94*  --  93*  --   --  94*  --  93* 92*  CO2 26  --  27  --   --  29  --  30 29  GLUCOSE 212*  --  149*  --   --  197*  --  179* 198*  BUN 28*  --  32*  --   --  31*  --  30* 30*  CREATININE 0.88  --  0.96  --   --  0.87  --  0.88 0.81  CALCIUM 7.5*  --  7.7*  --   --  7.6*  --  7.9* 7.6*  < > = values in this interval not displayed.  Liver Function Tests:  Recent Labs Lab 07/02/15 0848  ALBUMIN 2.2*   No results for input(s): LIPASE, AMYLASE in the last 168 hours. No  results for input(s): AMMONIA in the last 168 hours.  CBC:  Recent Labs Lab 06/30/15 0543 07/01/15 0507 07/02/15 0847 07/03/15 0855 07/04/15 0030  WBC 12.4* 14.3* 20.5* 17.2* 15.6*  HGB 8.8* 9.0* 9.4* 9.1* 8.7*  HCT 27.3* 26.7* 29.2* 27.9* 25.9*  MCV 86.1 84.9 85.6 85.6 85.3  PLT 246 224 229 199 181    Cardiac Enzymes:  Recent Labs Lab 06/29/15 0923 06/29/15 1446 06/29/15 2038  TROPONINI 0.42* 0.52* 0.57*    BNP: Invalid input(s): POCBNP  CBG:  Recent Labs Lab 07/04/15 0738 07/04/15 1215 07/04/15 1637 07/04/15 2038 07/05/15 0759  GLUCAP 169* 192* 182* 256* 144*    Microbiology: Results for orders placed or performed during the hospital encounter of 06/21/15  Stool culture     Status: None   Collection Time: 06/21/15  5:17 PM  Result Value Ref Range Status   Specimen Description STOOL  Final   Special Requests Normal  Final   Culture   Final    NO SALMONELLA OR SHIGELLA  ISOLATED No Pathogenic E. coli detected NO CAMPYLOBACTER DETECTED    Report Status 06/23/2015 FINAL  Final  C difficile quick scan w PCR reflex ([facility] only)     Status: Abnormal   Collection Time: 06/21/15  5:17 PM  Result Value Ref Range Status   C Diff antigen POSITIVE (A) NEGATIVE Final   C Diff toxin POSITIVE (A) NEGATIVE Final   C Diff interpretation   Final    Positive for toxigenic C. difficile, active toxin production present.    Comment: CRITICAL RESULT CALLED TO, READ BACK BY AND VERIFIED WITH: DONALD SWEENEY ON 06/21/15 AT 1755 BY JEF   MRSA PCR Screening     Status: None   Collection Time: 06/25/15 10:48 AM  Result Value Ref Range Status   MRSA by PCR NEGATIVE NEGATIVE Final    Comment:        The GeneXpert MRSA Assay (FDA approved for NASAL specimens only), is one component of a comprehensive MRSA colonization surveillance program. It is not intended to diagnose MRSA infection nor to guide or monitor treatment for MRSA infections.     Coagulation Studies: No  results for input(s): LABPROT, INR in the last 72 hours.  Urinalysis: No results for input(s): COLORURINE, LABSPEC, PHURINE, GLUCOSEU, HGBUR, BILIRUBINUR, KETONESUR, PROTEINUR, UROBILINOGEN, NITRITE, LEUKOCYTESUR in the last 72 hours.  Invalid input(s): APPERANCEUR    Imaging: No results found.   Medications:   . diltiazem (CARDIZEM) infusion Stopped (07/01/15 0400)   . acidophilus  1 capsule Oral BID  . antiseptic oral rinse  7 mL Mouth Rinse BID  . aspirin  81 mg Oral Daily  . atorvastatin  80 mg Oral QPM  . benazepril  40 mg Oral BID  . carvedilol  25 mg Oral BID  . digoxin  0.25 mg Oral Daily  . diltiazem  240 mg Oral Daily  . dorzolamide  1 drop Both Eyes TID  . famotidine  20 mg Oral Q supper  . feeding supplement (GLUCERNA SHAKE)  237 mL Oral QID  . furosemide  40 mg Intravenous Q12H  . heparin subcutaneous  5,000 Units Subcutaneous 3 times per day  . hydrALAZINE  100 mg Oral TID  . hydrocortisone  25 mg Rectal BID  . hydrocortisone cream   Topical QID  . insulin aspart  0-15 Units Subcutaneous TID WC  . insulin aspart  0-5 Units Subcutaneous QHS  . insulin glargine  20 Units Subcutaneous QHS  . latanoprost  1 drop Both Eyes QPM  . metoprolol tartrate  12.5 mg Oral Q6H  . multivitamin with minerals  1 tablet Oral q morning - 10a  . potassium chloride  20 mEq Oral Daily  . saccharomyces boulardii  250 mg Oral BID  . sertraline  50 mg Oral Daily  . timolol  1 drop Both Eyes BID  . vancomycin  250 mg Oral 4 times per day   albuterol, alum & mag hydroxide-simeth, diphenhydrAMINE, enalaprilat, hydrALAZINE, labetalol, liver oil-zinc oxide, morphine injection, ondansetron (ZOFRAN) IV, phenylephrine-shark liver oil-mineral oil-petrolatum, simethicone, traMADol  Assessment/ Plan:  [name] is a 81 y.o. [place] female with diabetes mellitus type II, hypertension, congestive heart failure, hyperlipidemia, coronary artery disease, atrial fibrillation, GERD, mitral  valve disorder who was admitted to [facility] on 06/21/2015 for C. Diff colitis. Patient was found to have a serum sodium of 122. Baseline of 132 (05/03/2015).   1. Acute hyponatremia on chronic hyponatremia:  Likely secondary to hypervolemia  2. Generalized Edema: with low  albumin. Recent echo shows normal LVEF. Abdominal imaging did not show liver cirrhosis. - continue furosemide. Change to IV infusion. - Add albumin supplementation IV twice a day  3. Hypokalemia: due to IV furosemide. May contributing to GI distension. At goal - potassium chloride   4. Acute kidney injury : creatinine now back to baseline. GFR greater than 60.  however may be dilutional from volume overload. - monitor volume status, urine output and renal function   5. Hypertension with atrial fibrillation with RVR:  - home regimen of benazepril, carvedilol, diltiazem, clonidine, furosemide, hydralazine - We'll discontinue hydralazine as it may be contributing to fluid retention     LOS: 14 Jac Romulus 8/7/201610:55 AM

## 2015-07-05 NOTE — Consult Note (Signed)
Subjective: Patient seen for C. difficile colitis. Patient continues on oral vancomycin and has been started on florastor. She denies any abdominal pain or nausea.  Objective: Vital signs in last 24 hours: Temp:  [97.3 F (36.3 C)-97.8 F (36.6 C)] 97.3 F (36.3 C) (08/07 1309) Pulse Rate:  [62-120] 62 (08/07 1309) Resp:  [14-20] 14 (08/07 1309) BP: (118-173)/(59-128) 132/59 mmHg (08/07 1309) SpO2:  [95 %-96 %] 95 % (08/07 1309) Blood pressure 132/59, pulse 62, temperature 97.3 F (36.3 C), temperature source Oral, resp. rate 14, height  (1.6 m), weight 97.251 kg (214 lb 6.4 oz), SpO2 95 %.   Intake/Output from previous day: 08/06 0701 - 08/07 0700 In: -  Out: 1457 [Urine:1450; Stool:7]  Intake/Output this shift:     General appearance:  Elderly female no acute distress. Resp:  Clear to auscultation Cardio:  Irregularly irregular GI:  Soft nontender nondistended bowel sounds positive normoactive Extremities:  1+ to 2+ edema   Lab Results: Results for orders placed or performed during the hospital encounter of 06/21/15 (from the past 24 hour(s))  Glucose, capillary     Status: Abnormal   Collection Time: 07/04/15  8:38 PM  Result Value Ref Range   Glucose-Capillary 256 (H) 65 - 99 mg/dL  Glucose, capillary     Status: Abnormal   Collection Time: 07/05/15  7:59 AM  Result Value Ref Range   Glucose-Capillary 144 (H) 65 - 99 mg/dL  Glucose, capillary     Status: Abnormal   Collection Time: 07/05/15 11:36 AM  Result Value Ref Range   Glucose-Capillary 164 (H) 65 - 99 mg/dL  Urinalysis complete, with microscopic ([facility] only)     Status: Abnormal   Collection Time: 07/05/15  1:14 PM  Result Value Ref Range   Color, Urine AMBER (A) YELLOW   APPearance CLOUDY (A) CLEAR   Glucose, UA NEGATIVE NEGATIVE mg/dL   Bilirubin Urine NEGATIVE NEGATIVE   Ketones, ur NEGATIVE NEGATIVE mg/dL   Specific Gravity, Urine 1.009 1.005 - 1.030   Hgb urine dipstick 3+ (A) NEGATIVE   pH  9.0 (H) 5.0 - 8.0   Protein, ur 100 (A) NEGATIVE mg/dL   Nitrite NEGATIVE NEGATIVE   Leukocytes, UA 3+ (A) NEGATIVE   RBC / HPF TOO NUMEROUS TO COUNT 0 - 5 RBC/hpf   WBC, UA TOO NUMEROUS TO COUNT 0 - 5 WBC/hpf   Bacteria, UA FEW (A) NONE SEEN   Squamous Epithelial / LPF 0-5 (A) NONE SEEN   WBC Clumps PRESENT   Glucose, capillary     Status: Abnormal   Collection Time: 07/05/15  4:51 PM  Result Value Ref Range   Glucose-Capillary 118 (H) 65 - 99 mg/dL   Comment 1 Notify RN       Recent Labs  07/03/15 0855 07/04/15 0030  WBC 17.2* 15.6*  HGB 9.1* 8.7*  HCT 27.9* 25.9*  PLT 199 181   BMET  Recent Labs  07/02/15 2041 07/03/15 0855 07/04/15 0030  NA 128* 130* 129*  K  --  4.1 3.9  CL  --  93* 92*  CO2  --  30 29  GLUCOSE  --  179* 198*  BUN  --  30* 30*  CREATININE  --  0.88 0.81  CALCIUM  --  7.9* 7.6*   LFT No results for input(s): PROT, ALBUMIN, AST, ALT, ALKPHOS, BILITOT, BILIDIR, IBILI in the last 72 hours. PT/INR No results for input(s): LABPROT, INR in the last 72 hours. Hepatitis Panel No  results for input(s): HEPBSAG, HCVAB, HEPAIGM, HEPBIGM in the last 72 hours. C-Diff No results for input(s): CDIFFTOX in the last 72 hours. No results for input(s): CDIFFPCR in the last 72 hours.   Studies/Results: No results found.  Scheduled Inpatient Medications:   . acidophilus  1 capsule Oral BID  . albumin human  12.5 g Intravenous BID  . antiseptic oral rinse  7 mL Mouth Rinse BID  . aspirin  81 mg Oral Daily  . atorvastatin  80 mg Oral QPM  . benazepril  40 mg Oral BID  . carvedilol  25 mg Oral BID  . digoxin  0.25 mg Oral Daily  . diltiazem  240 mg Oral Daily  . dorzolamide  1 drop Both Eyes TID  . famotidine  20 mg Oral Q supper  . feeding supplement (GLUCERNA SHAKE)  237 mL Oral QID  . heparin subcutaneous  5,000 Units Subcutaneous 3 times per day  . hydrocortisone  25 mg Rectal BID  . hydrocortisone cream   Topical QID  . insulin aspart  0-15  Units Subcutaneous TID WC  . insulin aspart  0-5 Units Subcutaneous QHS  . insulin glargine  20 Units Subcutaneous QHS  . latanoprost  1 drop Both Eyes QPM  . metoprolol tartrate  12.5 mg Oral Q6H  . multivitamin with minerals  1 tablet Oral q morning - 10a  . potassium chloride  20 mEq Oral Daily  . saccharomyces boulardii  250 mg Oral BID  . sertraline  50 mg Oral Daily  . timolol  1 drop Both Eyes BID  . vancomycin  250 mg Oral 4 times per day    Continuous Inpatient Infusions:   . diltiazem (CARDIZEM) infusion Stopped (07/01/15 0400)  . furosemide (LASIX) infusion 8 mg/hr (07/05/15 1215)    PRN Inpatient Medications:  albuterol, alum & mag hydroxide-simeth, diphenhydrAMINE, enalaprilat, hydrALAZINE, labetalol, liver oil-zinc oxide, morphine injection, ondansetron (ZOFRAN) IV, phenylephrine-shark liver oil-mineral oil-petrolatum, simethicone, traMADol  Miscellaneous:   Assessment:  1. C. difficile colitis. Slow Response to oral vancomycin. 2. Perirectal dermatitis with skin breakdown and external/internal hemorrhoids. 3. Patient was being cleaned by nursing staff when I saw her. They change the Foley catheter. Urine in the Foley tube was slightly bloody and cloudy. Would recommend that they send some urine for UA  Plan:  1. Continue current 2. If UA is positive for infection by culture it may be necessary to continue the vancomycin for some period of time after treatment of the of the urine infection  Christena Deem MD 07/05/2015, 8:01 PM

## 2015-07-06 LAB — BASIC METABOLIC PANEL
ANION GAP: 8 (ref 5–15)
BUN: 25 mg/dL — AB (ref 6–20)
CO2: 31 mmol/L (ref 22–32)
CREATININE: 0.64 mg/dL (ref 0.44–1.00)
Calcium: 7.9 mg/dL — ABNORMAL LOW (ref 8.9–10.3)
Chloride: 93 mmol/L — ABNORMAL LOW (ref 101–111)
GFR calc Af Amer: >60 mL/min (ref >60)
GFR calc non Af Amer: >60 mL/min (ref >60)
Glucose, Bld: 99 mg/dL (ref 65–99)
Potassium: 2.6 mmol/L — CL (ref 3.5–5.1)
Sodium: 132 mmol/L — ABNORMAL LOW (ref 135–145)

## 2015-07-06 LAB — CBC
HEMATOCRIT: 21.6 % — AB (ref 35.0–47.0)
Hemoglobin: 7.3 g/dL — ABNORMAL LOW (ref 12.0–16.0)
MCH: 29.1 pg (ref 26.0–34.0)
MCHC: 33.9 g/dL (ref 32.0–36.0)
MCV: 85.9 fL (ref 80.0–100.0)
Platelets: 155 10*3/uL (ref 150–440)
RBC: 2.52 MIL/uL — ABNORMAL LOW (ref 3.80–5.20)
RDW: 16.1 % — ABNORMAL HIGH (ref 11.5–14.5)
WBC: 19.4 10*3/uL — ABNORMAL HIGH (ref 3.6–11.0)

## 2015-07-06 LAB — GLUCOSE, CAPILLARY
GLUCOSE-CAPILLARY: 99 mg/dL (ref 65–99)
GLUCOSE-CAPILLARY: 179 mg/dL — AB (ref 65–99)
GLUCOSE-CAPILLARY: 222 mg/dL — AB (ref 65–99)
Glucose-Capillary: 183 mg/dL — ABNORMAL HIGH (ref 65–99)

## 2015-07-06 LAB — ABO/RH: ABO/RH(D): O POS

## 2015-07-06 LAB — MAGNESIUM: MAGNESIUM: 2 mg/dL (ref 1.7–2.4)

## 2015-07-06 LAB — PREPARE RBC (CROSSMATCH)

## 2015-07-06 MED ORDER — SODIUM CHLORIDE 0.9 % IV SOLN
Freq: Once | INTRAVENOUS | Status: AC
  Administered 2015-07-06: 15:00:00 via INTRAVENOUS

## 2015-07-06 MED ORDER — POTASSIUM CHLORIDE 20 MEQ PO PACK
40.0000 meq | PACK | Freq: Once | ORAL | Status: AC
  Administered 2015-07-06: 40 meq via ORAL
  Filled 2015-07-06 (×2): qty 2

## 2015-07-06 MED ORDER — SPIRONOLACTONE 25 MG PO TABS
25.0000 mg | ORAL_TABLET | Freq: Every day | ORAL | Status: DC
  Administered 2015-07-06 – 2015-07-13 (×8): 25 mg via ORAL
  Filled 2015-07-06 (×8): qty 1

## 2015-07-06 MED ORDER — NYSTATIN 100000 UNIT/GM EX POWD
Freq: Two times a day (BID) | CUTANEOUS | Status: DC
  Administered 2015-07-06 – 2015-07-13 (×14): via TOPICAL
  Filled 2015-07-06 (×2): qty 15

## 2015-07-06 MED ORDER — POTASSIUM CHLORIDE IN NACL 40-0.9 MEQ/L-% IV SOLN
INTRAVENOUS | Status: DC
  Administered 2015-07-06: 75 mL/h via INTRAVENOUS
  Filled 2015-07-06: qty 1000

## 2015-07-06 MED ORDER — NYSTATIN 100000 UNIT/ML MT SUSP
5.0000 mL | Freq: Four times a day (QID) | OROMUCOSAL | Status: DC
  Administered 2015-07-06 – 2015-07-13 (×27): 500000 [IU] via ORAL
  Filled 2015-07-06 (×31): qty 5

## 2015-07-06 MED ORDER — LEVOFLOXACIN 250 MG PO TABS
250.0000 mg | ORAL_TABLET | Freq: Every day | ORAL | Status: DC
  Administered 2015-07-06: 250 mg via ORAL
  Filled 2015-07-06: qty 1

## 2015-07-06 MED ORDER — POTASSIUM CHLORIDE CRYS ER 20 MEQ PO TBCR
40.0000 meq | EXTENDED_RELEASE_TABLET | Freq: Two times a day (BID) | ORAL | Status: DC
  Administered 2015-07-06 (×2): 40 meq via ORAL
  Filled 2015-07-06: qty 2

## 2015-07-06 MED ORDER — DEXTROSE 5 % IV SOLN
1.0000 g | INTRAVENOUS | Status: AC
  Administered 2015-07-06 – 2015-07-09 (×4): 1 g via INTRAVENOUS
  Filled 2015-07-06 (×4): qty 10

## 2015-07-06 NOTE — Consult Note (Signed)
Pt out of CCU and still in isolation.  She had formed stools but small amount today, no bleeding. Appetite still poor.  She said she will try ice cream.  Pt hgb was low this am so is getting a unit of blood.  Exam shows somewhat distended abd but soft and not tender., no masses.  Ext less edema than before.  Oral with coating on tongue.  Pt with UTI and on Levaquin.  Will give empiric nystatin due to coating on tongue and have nurses order ice cream.

## 2015-07-06 NOTE — Progress Notes (Signed)
[facility] - [place] at [facility]   PATIENT NAME: Katherine [name]    MR#:  [id number]  DATE OF BIRTH:  01-11-1932  SUBJECTIVE:   Still have loose stool, found to have UTI. Generalized swelling and very weak.  REVIEW OF SYSTEMS:   Review of Systems  Constitutional: Negative for fever, chills and weight loss.  HENT: Negative for ear discharge, ear pain and nosebleeds.   Eyes: Negative for blurred vision, pain and discharge.  Respiratory: Negative for sputum production, shortness of breath, wheezing and stridor.   Cardiovascular: Positive for leg swelling. Negative for chest pain, palpitations, orthopnea and PND.  Gastrointestinal: Positive for diarrhea. Negative for vomiting and blood in stool.  Genitourinary: Negative for urgency and frequency.  Musculoskeletal: Negative for back pain and joint pain.  Neurological: Positive for weakness. Negative for sensory change, speech change and focal weakness.  Psychiatric/Behavioral: Negative for depression. The patient is not nervous/anxious.   All other systems reviewed and are negative.  Tolerating Diet: not able to eat much, very nauseous and distended abdomen Tolerating PT: eval pending DRUG ALLERGIES:   Allergies  Allergen Reactions  . Benzocaine Other (See Comments)    Unknown reaction  . Contrast Media [Iodinated Diagnostic Agents] Other (See Comments)    Tachycardia, SVT  . Other Other (See Comments)    MRI dye--reaction unknown.  Marland Kitchen Penicillin V Potassium Other (See Comments)  . Penicillins Other (See Comments)    Reaction: pt doesn't know, b/c it's been a long time ago.   . Sulfa Antibiotics Other (See Comments)    Reaction: pt doesn't know, b/c it's been a long time ago.    VITALS:  Blood pressure 141/89, pulse 60, temperature 98.3 F (36.8 C), temperature source Oral, resp. rate 21, height 5\' 3"  (1.6 m), weight 97.251 kg (214 lb 6.4 oz), SpO2 96 %. PHYSICAL EXAMINATION:  Physical Exam   Constitutional: She is oriented to person, place, and time and well-developed, well-nourished, and in no distress.  HENT:  Head: Normocephalic and atraumatic.  Eyes: Conjunctivae and EOM are normal. Pupils are equal, round, and reactive to light.  Neck: Normal range of motion. Neck supple. No tracheal deviation present. No thyromegaly present.  Cardiovascular: Normal rate, regular rhythm and normal heart sounds.   Pulmonary/Chest: Effort normal and breath sounds normal. No respiratory distress. She has no wheezes. She exhibits no tenderness.  Abdominal: Soft. Bowel sounds are normal. She exhibits distension (gaseous). There is tenderness.  Musculoskeletal: Normal range of motion. She exhibits edema.  Neurological: She is alert and oriented to person, place, and time. No cranial nerve deficit.  Skin: Skin is warm and dry. No rash noted.  Psychiatric: Mood and affect normal.   LABORATORY PANEL:   CBC  Recent Labs Lab 07/06/15 0352  WBC 19.4*  HGB 7.3*  HCT 21.6*  PLT 155    Chemistries   Recent Labs Lab 07/06/15 0352  NA 132*  K 2.6*  CL 93*  CO2 31  GLUCOSE 99  BUN 25*  CREATININE 0.64  CALCIUM 7.9*    Cardiac Enzymes  Recent Labs Lab 06/29/15 2038  TROPONINI 0.57*   RADIOLOGY:  No results found. ASSESSMENT AND PLAN:  81 yr old W F with multiple medical problmes got out of rehab after having treated with IV abxs for Orbital cellulitis. Presented after a fall at home and ongoing diarrhea.   * C. Diff: Collitis: still have diarrhea slightly better stool, Leukocytosis is better. continue PO vancomycin, Flagyl and  imipenem were discontinued. Per Dr. Marva Panda  the patient is not a candidate for stool transplant.  started florastor.  * GIB possible due to hemmoroid:   ASA, heparin SQ, Hb is dropped to 7.3 ( 07/06/15)   Transfuse one unit PRBC ( 07/06/15)  * UTI  Started on levaquin 07/06/15- check for urine culture  * HYpokalemia  due to lasix IV  Replace oral  and IV- recheck.  *  Acute hyponatremia on chronic hyponatremia: likely due to SIADH, given one dose of tolvaptan on July 29., i but worse for 2 days.so stopped, Increased lasix to 40 mg bid per Dr. Wynelle Link, Na is up to 130 , Follow-up BMP. As swelling is not responding- started on IV lasix drip ( 07/05/15) by Nephro and albumin infusion twice daily.  * DM. Blood sugar is better, continue Lantus and Continue sliding scale.  Hypoglycemia: Improved.  * A-fib with RVR.   off carddizem drip for 2 days, continue ASA,  Continue carvedilol, diltiazem, digoxin and lopreesor per Dr. Juliann Pares. Echo shows normal EF at 60%.  * left elbow pain after fall with abnromal xray. Improved. -appreciate ortho input - no pathology  * Aute kidney injury on chronic kidney disease stage III: improved and stable.  * Hypertension    continue benazepril, carvedilol, diltiazem, lopressor, clonidine, furosemide, hydralazine  IV hydralazine prn.  * Pleural effusion.  from volume overload and sepsis. continue Lasix  * Acute on chronic diastolic CHF (EF at 60-65%). Unclear etiology. Continue lasix and  Benazepril.  * Ileus. Secondary to severe C. difficile colitis.  Improving per abd xray. Advanced diet.  PT Evaluation suggested skilled nursing facility placement.  Discussed with Dr. Wynelle Link. Case discussed with Care Management/Social Worker. >50% time spent on counselling and coordination of care.  CODE STATUS: full  DVT Prophylaxis: heparin  TOTAL TIME TAKING CARE OF THIS PATIENT: 35 minutes.   POSSIBLE D/C IN 3-4 or more DAYS, DEPENDING ON CLINICAL CONDITION.   Altamese Dilling M.D on 07/06/2015 at 11:28 AM  Between 7am to 6pm - Pager - [phone]  After 6pm go to [email or web address] - password [email or web address] [facility]  [facility]  Office  [phone]  CC: Primary care physician; Mila Merry, MD

## 2015-07-06 NOTE — Progress Notes (Signed)
Nutrition Follow-up    INTERVENTION:  Meals and snacks: Spoke with Dr. Elisabeth Pigeon and agreeable to liberalizing diet to regular secondary to poor po intake Nutrition Supplement Therapy: continue to encourage intake of glucerna QID Nutrition related medication: Pt may benefit from appetite stimulant and discussed with Dr. Elisabeth Pigeon EN: If aggressive care is wanted, recommend nutrition support via enteral nutrition. MD wanted to hold on enteral nutrition at this time.    NUTRITION DIAGNOSIS:   Inadequate oral intake related to inability to eat, altered GI function, acute illness as evidenced by NPO status, being addressed with on po diet, supplements at this time   GOAL:   Patient will meet greater than or equal to 90% of their needs  Not meeting nutritional needs  MONITOR:    (Energy Intake: diet progrsesion, Digestive System, Electrolyte/Renal Profile, Glucose Profile)  REASON FOR ASSESSMENT:   Consult Assessment of nutrition requirement/status  ASSESSMENT:      Pt with UTI, c-diff colitis, planning blood transfusion today   Current Nutrition: no solid food eaten today, drank 2 glucerna per RN, Maddie. Limited intake per I and O sheet since admission   Gastrointestinal Profile: complaining of pain in rectum Last BM: today, diarrhea   Medications: floraster, NS with KCL at 58ml/hr  Electrolyte/Renal Profile and Glucose Profile:   Recent Labs Lab 07/03/15 0855 07/04/15 0030 07/06/15 0352 07/06/15 1311  NA 130* 129* 132*  --   K 4.1 3.9 2.6*  --   CL 93* 92* 93*  --   CO2 --   BUN 30* 30* 25*  --   CREATININE 0.88 0.81 0.64  --   CALCIUM 7.9* 7.6* 7.9*  --   MG  --   --   --  2.0  GLUCOSE 179* 198* 99  --    Protein Profile:  Recent Labs Lab 07/02/15 0848  ALBUMIN 2.2*     Nutrition-Focused Physical Exam Findings:  Unable to complete Nutrition-Focused physical exam at this time.     Weight Trend since Admission: Filed Weights   06/21/15 1508 07/02/15 0427  Weight: 213 lb (96.616 kg) 214 lb 6.4 oz (97.251 kg)      Diet Order:  Diet regular Room service appropriate?: Yes; Fluid consistency:: Thin  Skin:  Reviewed, no issues   Height:   Ht Readings from Last 1 Encounters:  06/21/15  (1.6 m)    Weight:   Wt Readings from Last 1 Encounters:  07/02/15 214 lb 6.4 oz (97.251 kg)     BMI:  Body mass index is 37.99 kg/(m^2).  Estimated Nutritional Needs:   Kcal:  1744-1889 kcals (BEE 1118, 1.3 AF, 1.2-1.3 IF) using IBW 52.3 kg  Protein:  62-73 g (1.2-1.4 g/kg)   Fluid:  1560-1820 mL (30-35 ml/kg)   EDUCATION NEEDS:      MODERATE Care Level  Bertha Lokken B. Freida Busman, RD, LDN [phone] (pager)

## 2015-07-06 NOTE — Consult Note (Signed)
WOC wound consult note Reason for Consult: Erythema and weeping areas from incontinence (fecal) Wound type:Moisture associated skin damage (MASD), specifically incontinence associated dermatitis (IAD) Pressure Ulcer POA: No Measurement: Perianal measures 18cm x 5cm and extends anteriorally to labia and medial thighs Wound GEX:[id number] pinpoint partial thickness open areas near the rectum where liquid stool has been held in place due to body habitus and creases. Confluent red center with faint satellite lesions indicative of fungal overgrowth Drainage (amount, consistency, odor) scant serous Periwound: intact, clear, macerated Dressing procedure/placement/frequency: I will today provide orders for a therapeutic mattress with low air loss feature and guidance for the nursing staff with regard to turning and repositioning (side to side with the superior leg brought up and over the inferior leg to open and expose the perianal region to air). We will float the heels as a pressure injury prevention intervention. Topical care to the affected skin will be to cleanse with our pH balanced, no rinse cleanser, pat dry, followed by dusting with Nystatin powder.  This will be covered with a thin layer of our clear moisture barrier ointment.  Often a one-time dose of IV antifungal (e.g., Diflucan) is helpful to accelerate the control of the fungal overgrowth. If you agree, please order. Thank you for the consultation on this patient.  The WOC nursing team will not follow, but will remain available to this patient, the nursing and medical teams.  Please re-consult if needed. Thanks, Ladona Mow, MSN, RN, GNP, [place], CWON-AP [phone])

## 2015-07-06 NOTE — Clinical Social Work Note (Signed)
CSW notified facility that pt would not DC today but she was still planning to DC to their facility when she was medically stable.

## 2015-07-06 NOTE — Progress Notes (Signed)
Dr. Betti Cruz notified of potasium of 2.6 and diarrhea, order received for potasium 40 meq PO once. No orders received for diarrhea.

## 2015-07-06 NOTE — Progress Notes (Signed)
[facility]  ROUNDING NOTE   Subjective:   Patient continues to complain of rectal pain.  Sodium level has improved to 132. However, potassium level critically low at 2.6 Patient continues to have massive anasarca Urine output recorded at 1450 cc She was started on Lasix infusion yesterday. Albumin supplementation was also increased to twice a day Patient continues to have complex multiple medical issues   Objective:  Vital signs in last 24 hours:  Temp:  [97.3 F (36.3 C)-98.3 F (36.8 C)] 97.6 F (36.4 C) (08/08 0444) Pulse Rate:  [61-86] 86 (08/08 0444) Resp:  [14-25] 25 (08/08 0444) BP: (132-139)/(59-65) 139/65 mmHg (08/08 0444) SpO2:  [95 %-99 %] 97 % (08/08 0444)  Weight change:  Filed Weights   06/21/15 1508 07/02/15 0427  Weight: 96.616 kg (213 lb) 97.251 kg (214 lb 6.4 oz)    Intake/Output: I/O last 3 completed shifts: In: -  Out: 2453 [Urine:2450; Stool:3]   Intake/Output this shift:  Total I/O In: -  Out: 1000 [Urine:1000]  Physical Exam: General: NAD  Head: Normocephalic, atraumatic. Moist oral mucosal membranes  Eyes: Anicteric,  Neck: Supple, trachea midline  Lungs:  Clear to auscultation  Heart: Irregular  Abdomen:  Soft, tender to palpation, +bowel sounds, mildly distended   Extremities: 3+ peripheral and dependent edema in all four extremities  Neurologic: Nonfocal, moving all four extremities  Skin: No acute lesions    Foley present     Basic Metabolic Panel:  Recent Labs Lab 07/01/15 0507  07/02/15 0847 07/02/15 2041 07/03/15 0855 07/04/15 0030 07/06/15 0352  NA 127*  < > 130*  129* 128* 130* 129* 132*  K 4.2  --  3.8  --  4.1 3.9 2.6*  CL 93*  --  94*  --  93* 92* 93*  CO2 27  --  29  --  GLUCOSE 149*  --  197*  --  179* 198* 99  BUN 32*  --  31*  --  30* 30* 25*  CREATININE 0.96  --  0.87  --  0.88 0.81 0.64  CALCIUM 7.7*  --  7.6*  --  7.9* 7.6* 7.9*  < > = values in this interval not  displayed.  Liver Function Tests:  Recent Labs Lab 07/02/15 0848  ALBUMIN 2.2*   No results for input(s): LIPASE, AMYLASE in the last 168 hours. No results for input(s): AMMONIA in the last 168 hours.  CBC:  Recent Labs Lab 07/01/15 0507 07/02/15 0847 07/03/15 0855 07/04/15 0030 07/06/15 0352  WBC 14.3* 20.5* 17.2* 15.6* 19.4*  HGB 9.0* 9.4* 9.1* 8.7* 7.3*  HCT 26.7* 29.2* 27.9* 25.9* 21.6*  MCV 84.9 85.6 85.6 85.3 85.9  PLT 224 229 199 181 155    Cardiac Enzymes:  Recent Labs Lab 06/29/15 1446 06/29/15 2038  TROPONINI 0.52* 0.57*    BNP: Invalid input(s): POCBNP  CBG:  Recent Labs Lab 07/05/15 0759 07/05/15 1136 07/05/15 1651 07/05/15 2047 07/06/15 0720  GLUCAP 144* 164* 118* 214* 99    Microbiology: Results for orders placed or performed during the hospital encounter of 06/21/15  Stool culture     Status: None   Collection Time: 06/21/15  5:17 PM  Result Value Ref Range Status   Specimen Description STOOL  Final   Special Requests Normal  Final   Culture   Final    NO SALMONELLA OR SHIGELLA ISOLATED No Pathogenic E. coli detected NO CAMPYLOBACTER DETECTED    Report Status 06/23/2015  FINAL  Final  C difficile quick scan w PCR reflex ([facility] only)     Status: Abnormal   Collection Time: 06/21/15  5:17 PM  Result Value Ref Range Status   C Diff antigen POSITIVE (A) NEGATIVE Final   C Diff toxin POSITIVE (A) NEGATIVE Final   C Diff interpretation   Final    Positive for toxigenic C. difficile, active toxin production present.    Comment: CRITICAL RESULT CALLED TO, READ BACK BY AND VERIFIED WITH: DONALD SWEENEY ON 06/21/15 AT 1755 BY JEF   MRSA PCR Screening     Status: None   Collection Time: 06/25/15 10:48 AM  Result Value Ref Range Status   MRSA by PCR NEGATIVE NEGATIVE Final    Comment:        The GeneXpert MRSA Assay (FDA approved for NASAL specimens only), is one component of a comprehensive MRSA colonization surveillance program. It  is not intended to diagnose MRSA infection nor to guide or monitor treatment for MRSA infections.     Coagulation Studies: No results for input(s): LABPROT, INR in the last 72 hours.  Urinalysis:  Recent Labs  07/05/15 1314  COLORURINE AMBER*  LABSPEC 1.009  PHURINE 9.0*  GLUCOSEU NEGATIVE  HGBUR 3+*  BILIRUBINUR NEGATIVE  KETONESUR NEGATIVE  PROTEINUR 100*  NITRITE NEGATIVE  LEUKOCYTESUR 3+*      Imaging: No results found.   Medications:   . diltiazem (CARDIZEM) infusion Stopped (07/01/15 0400)  . furosemide (LASIX) infusion 8 mg/hr (07/05/15 1215)   . acidophilus  1 capsule Oral BID  . albumin human  12.5 g Intravenous BID  . antiseptic oral rinse  7 mL Mouth Rinse BID  . aspirin  81 mg Oral Daily  . atorvastatin  80 mg Oral QPM  . benazepril  40 mg Oral BID  . carvedilol  25 mg Oral BID  . digoxin  0.25 mg Oral Daily  . diltiazem  240 mg Oral Daily  . dorzolamide  1 drop Both Eyes TID  . famotidine  20 mg Oral Q supper  . feeding supplement (GLUCERNA SHAKE)  237 mL Oral QID  . heparin subcutaneous  5,000 Units Subcutaneous 3 times per day  . hydrocortisone  25 mg Rectal BID  . hydrocortisone cream   Topical QID  . insulin aspart  0-15 Units Subcutaneous TID WC  . insulin aspart  0-5 Units Subcutaneous QHS  . insulin glargine  20 Units Subcutaneous QHS  . latanoprost  1 drop Both Eyes QPM  . metoprolol tartrate  12.5 mg Oral Q6H  . multivitamin with minerals  1 tablet Oral q morning - 10a  . potassium chloride  20 mEq Oral Daily  . saccharomyces boulardii  250 mg Oral BID  . sertraline  50 mg Oral Daily  . timolol  1 drop Both Eyes BID  . vancomycin  250 mg Oral 4 times per day   albuterol, alum & mag hydroxide-simeth, diphenhydrAMINE, enalaprilat, hydrALAZINE, labetalol, liver oil-zinc oxide, morphine injection, ondansetron (ZOFRAN) IV, phenylephrine-shark liver oil-mineral oil-petrolatum, simethicone, traMADol  Assessment/ Plan:  [name] is a 81 y.o. Katherine [name] female with diabetes mellitus type II, hypertension, congestive heart failure, hyperlipidemia, coronary artery disease, atrial fibrillation, GERD, mitral valve disorder who was admitted to [facility] on 06/21/2015 for C. Diff colitis. Patient was found to have a serum sodium of 122. Baseline of 132 (05/03/2015).   1. Acute hyponatremia on chronic hyponatremia:  Likely secondary to hypervolemia  2. Generalized Edema: with  low albumin. Recent echo shows normal LVEF. Abdominal imaging did not show liver cirrhosis. - continue furosemide IV infusion. - albumin supplementation IV twice a day  3. Hypokalemia: due to IV furosemide. May contributing to GI distension.  - potassium chloride  - We'll add spironolactone for added diuresis and hopefully will help with potassium conservation  4. Acute kidney injury : creatinine now back to baseline. GFR greater than 60.  however may be dilutional from volume overload. - monitor volume status, urine output and renal function   5. Hypertension with atrial fibrillation with RVR:  - multiple meds, As Volume status improves, blood pressure expected to go lower, therefore lower doses of antihypertensives will be needed - Hydralazine discontinued     LOS: 15 Harrison Zetina 8/8/20169:46 AM

## 2015-07-06 NOTE — Progress Notes (Addendum)
Per patient request, updated family on status and blood transfusion occuring today.   Reclarified with Dr. Elisabeth Pigeon re IVF with KCl and patient history of CHF. Pt is receiving PO KCl replacement and per Dr. Elisabeth Pigeon, OK to hold IVF at this time.

## 2015-07-06 NOTE — Progress Notes (Signed)
OT Cancellation Note  Patient Details Name: IDORA BROSIOUS MRN: [id number] DOB: [birth year]   Cancelled Treatment:    Reason Eval/Treat Not Completed: Medical issues which prohibited therapy potassium 2.6  Gwyndolyn Kaufman, MS/OTR/L   07/06/2015, 9:40 AM

## 2015-07-07 LAB — TYPE AND SCREEN
ABO/RH(D): O POS
Antibody Screen: NEGATIVE
UNIT DIVISION: 0

## 2015-07-07 LAB — GLUCOSE, CAPILLARY
GLUCOSE-CAPILLARY: 213 mg/dL — AB (ref 65–99)
GLUCOSE-CAPILLARY: 253 mg/dL — AB (ref 65–99)
GLUCOSE-CAPILLARY: 167 mg/dL — AB (ref 65–99)
Glucose-Capillary: 222 mg/dL — ABNORMAL HIGH (ref 65–99)

## 2015-07-07 LAB — CBC
HEMATOCRIT: 26.4 % — AB (ref 35.0–47.0)
HEMOGLOBIN: 8.8 g/dL — AB (ref 12.0–16.0)
MCH: 29 pg (ref 26.0–34.0)
MCHC: 33.3 g/dL (ref 32.0–36.0)
MCV: 87.1 fL (ref 80.0–100.0)
PLATELETS: 171 10*3/uL (ref 150–440)
RBC: 3.03 MIL/uL — ABNORMAL LOW (ref 3.80–5.20)
RDW: 16.8 % — ABNORMAL HIGH (ref 11.5–14.5)
WBC: 14 10*3/uL — ABNORMAL HIGH (ref 3.6–11.0)

## 2015-07-07 LAB — BASIC METABOLIC PANEL
Anion gap: 10 (ref 5–15)
BUN: 23 mg/dL — ABNORMAL HIGH (ref 6–20)
CHLORIDE: 91 mmol/L — AB (ref 101–111)
CO2: 32 mmol/L (ref 22–32)
CREATININE: 0.75 mg/dL (ref 0.44–1.00)
Calcium: 8.2 mg/dL — ABNORMAL LOW (ref 8.9–10.3)
GFR calc Af Amer: >60 mL/min (ref >60)
GFR calc non Af Amer: >60 mL/min (ref >60)
Glucose, Bld: 213 mg/dL — ABNORMAL HIGH (ref 65–99)
Potassium: 3.6 mmol/L (ref 3.5–5.1)
Sodium: 133 mmol/L — ABNORMAL LOW (ref 135–145)

## 2015-07-07 NOTE — Progress Notes (Signed)
Inpatient Diabetes Program Recommendations  AACE/ADA: New Consensus Statement on Inpatient Glycemic Control (2013)  Target Ranges:  Prepandial:   less than 140 mg/dL      Peak postprandial:   less than 180 mg/dL (1-2 hours)      Critically ill patients:  140 - 180 mg/dL     Results for WHITEKendal, Ghazarian (MRN [id number]) as of 07/07/2015 12:49  Ref. Range 07/06/2015 07:20 07/06/2015 11:20 07/06/2015 16:14 07/06/2015 20:47  Glucose-Capillary Latest Ref Range: 65-99 mg/dL 99 629 (H) 528 (H) 413 (H)    Results for Dossett, PRAGYA LOFASO (MRN [id number]) as of 07/07/2015 12:49  Ref. Range 07/07/2015 07:30 07/07/2015 11:26  Glucose-Capillary Latest Ref Range: 65-99 mg/dL 536 (H) 644 (H)     Home DM Meds: Lantus 50 units QHS       Januvia 50 mg daily       Glipizide 5 mg daily  Current DM Orders: Lantus 20 units QHS            Novolog Moderate SSI (0-15 units) TID AC + HS    MD- Please consider increasing Lantus to 30 units QHS (home dose is 50 units QHS- patient having hyperglycemia today)     Will follow Ambrose Finland RN, MSN, CDE Diabetes Coordinator Inpatient Glycemic Control Team Team Pager: [phone] (8a-5p)

## 2015-07-07 NOTE — Progress Notes (Signed)
OT Cancellation Note  Patient Details Name: Katherine [name] MRN: [id number] DOB: 02-Jul-1932   Cancelled Treatment:    Reason Eval/Treat Not Completed: Fatigue/lethargy limiting ability to participate  Ocie Cornfield 07/07/2015, 11:59 AM

## 2015-07-07 NOTE — Care Management Note (Addendum)
Case Management Note  Patient Details  Name: SAMANTHA RAGEN MRN: [id number] Date of Birth: 08/31/1932  Subjective/Objective:     Ms Liszewski is originally from [place].  A- Fib resolved and cardizem off x 2 days. Wound Care Nurse is treating perianal skin breakdown and has ordered a specialty mattress. Copious watery diarrhea from C-diff continues. Has a foley catheter. PO Vancomycin. Receiving IV lasix and IV albumin for generalized edema. Floraster stated to enhance bowel functioning. Ileus improving per MD notes. Continues to c/o abdominal pain. Discussed with [facility] but because Ms Henthorn has [employer], they cannot admit until Ms Flam has been inpatient for 21 days. They will follow for potential future admission to LTAC.               Action/Plan:   Expected Discharge Date:                  Expected Discharge Plan:     In-House Referral:     Discharge planning Services     Post Acute Care Choice:    Choice offered to:     DME Arranged:    DME Agency:     HH Arranged:    HH Agency:     Status of Service:     Medicare Important Message Given:  Yes-second notification given Date Medicare IM Given:    Medicare IM give by:    Date Additional Medicare IM Given:    Additional Medicare Important Message give by:     If discussed at Long Length of Stay Meetings, dates discussed:    Additional Comments:  Yamira Papa A, RN 07/07/2015, 11:50 AM

## 2015-07-07 NOTE — Progress Notes (Signed)
[facility]  ROUNDING NOTE   Subjective:   Patient continues to have complex multiple medical issues. Currently on Lasix via IV infusion. Good response. Urine output 3750 cc Otherwise remains lethargic. Did not have any specific complaints except feeling tired   Objective:  Vital signs in last 24 hours:  Temp:  [97.7 F (36.5 C)-98 F (36.7 C)] 98 F (36.7 C) (08/09 1417) Pulse Rate:  [86-104] 95 (08/09 1601) Resp:  [18-20] 18 (08/09 1417) BP: (160-174)/(59-88) 165/68 mmHg (08/09 1601) SpO2:  [96 %-98 %] 96 % (08/09 1417)  Weight change:  Filed Weights   06/21/15 1508 07/02/15 0427  Weight: 96.616 kg (213 lb) 97.251 kg (214 lb 6.4 oz)    Intake/Output: I/O last 3 completed shifts: In: 1267.2 [P.O.:714; I.V.:75; Blood:378.2; IV Piggyback:100] Out: 3750 [Urine:3750]   Intake/Output this shift:  Total I/O In: 100 [IV Piggyback:100] Out: -   Physical Exam: General: NAD  Head: Normocephalic, atraumatic. Moist oral mucosal membranes  Eyes: Anicteric,  Neck: Supple, trachea midline  Lungs:  Clear to auscultation  Heart: Irregular  Abdomen:  Soft, tender to palpation, +bowel sounds, mildly distended   Extremities: 3+ peripheral and dependent edema in all four extremities  Neurologic: Nonfocal, moving all four extremities  Skin: No acute lesions    Foley present     Basic Metabolic Panel:  Recent Labs Lab 07/02/15 0847 07/02/15 2041 07/03/15 0855 07/04/15 0030 07/06/15 0352 07/06/15 1311 07/07/15 0414  NA 130*  129* 128* 130* 129* 132*  --  133*  K 3.8  --  4.1 3.9 2.6*  --  3.6  CL 94*  --  93* 92* 93*  --  91*  CO2 29  --  30 29 31   --  32  GLUCOSE 197*  --  179* 198* 99  --  213*  BUN 31*  --  30* 30* 25*  --  23*  CREATININE 0.87  --  0.88 0.81 0.64  --  0.75  CALCIUM 7.6*  --  7.9* 7.6* 7.9*  --  8.2*  MG  --   --   --   --   --  2.0  --     Liver Function Tests:  Recent Labs Lab 07/02/15 0848  ALBUMIN 2.2*   No results for  input(s): LIPASE, AMYLASE in the last 168 hours. No results for input(s): AMMONIA in the last 168 hours.  CBC:  Recent Labs Lab 07/02/15 0847 07/03/15 0855 07/04/15 0030 07/06/15 0352 07/07/15 0414  WBC 20.5* 17.2* 15.6* 19.4* 14.0*  HGB 9.4* 9.1* 8.7* 7.3* 8.8*  HCT 29.2* 27.9* 25.9* 21.6* 26.4*  MCV 85.6 85.6 85.3 85.9 87.1  PLT 229 199 181 155 171    Cardiac Enzymes: No results for input(s): CKTOTAL, CKMB, CKMBINDEX, TROPONINI in the last 168 hours.  BNP: Invalid input(s): POCBNP  CBG:  Recent Labs Lab 07/06/15 1120 07/06/15 1614 07/06/15 2047 07/07/15 0730 07/07/15 1126  GLUCAP 183* 179* 222* 213* 253*    Microbiology: Results for orders placed or performed during the hospital encounter of 06/21/15  Stool culture     Status: None   Collection Time: 06/21/15  5:17 PM  Result Value Ref Range Status   Specimen Description STOOL  Final   Special Requests Normal  Final   Culture   Final    NO SALMONELLA OR SHIGELLA ISOLATED No Pathogenic E. coli detected NO CAMPYLOBACTER DETECTED    Report Status 06/23/2015 FINAL  Final  C difficile quick scan  w PCR reflex ([facility] only)     Status: Abnormal   Collection Time: 06/21/15  5:17 PM  Result Value Ref Range Status   C Diff antigen POSITIVE (A) NEGATIVE Final   C Diff toxin POSITIVE (A) NEGATIVE Final   C Diff interpretation   Final    Positive for toxigenic C. difficile, active toxin production present.    Comment: CRITICAL RESULT CALLED TO, READ BACK BY AND VERIFIED WITH: DONALD SWEENEY ON 06/21/15 AT 1755 BY JEF   MRSA PCR Screening     Status: None   Collection Time: 06/25/15 10:48 AM  Result Value Ref Range Status   MRSA by PCR NEGATIVE NEGATIVE Final    Comment:        The GeneXpert MRSA Assay (FDA approved for NASAL specimens only), is one component of a comprehensive MRSA colonization surveillance program. It is not intended to diagnose MRSA infection nor to guide or monitor treatment for MRSA  infections.   Urine culture     Status: None (Preliminary result)   Collection Time: 07/06/15 11:38 AM  Result Value Ref Range Status   Specimen Description URINE, RANDOM  Final   Special Requests NONE  Final   Culture   Final    30,000 COLONIES/mL GRAM NEGATIVE RODS IDENTIFICATION AND SUSCEPTIBILITIES TO FOLLOW    Report Status PENDING  Incomplete    Coagulation Studies: No results for input(s): LABPROT, INR in the last 72 hours.  Urinalysis:  Recent Labs  07/05/15 1314  COLORURINE AMBER*  LABSPEC 1.009  PHURINE 9.0*  GLUCOSEU NEGATIVE  HGBUR 3+*  BILIRUBINUR NEGATIVE  KETONESUR NEGATIVE  PROTEINUR 100*  NITRITE NEGATIVE  LEUKOCYTESUR 3+*      Imaging: No results found.   Medications:   . furosemide (LASIX) infusion 8 mg/hr (07/06/15 1827)   . acidophilus  1 capsule Oral BID  . albumin human  12.5 g Intravenous BID  . antiseptic oral rinse  7 mL Mouth Rinse BID  . aspirin  81 mg Oral Daily  . atorvastatin  80 mg Oral QPM  . benazepril  40 mg Oral BID  . carvedilol  25 mg Oral BID  . cefTRIAXone (ROCEPHIN)  IV  1 g Intravenous Q24H  . digoxin  0.25 mg Oral Daily  . diltiazem  240 mg Oral Daily  . dorzolamide  1 drop Both Eyes TID  . famotidine  20 mg Oral Q supper  . feeding supplement (GLUCERNA SHAKE)  237 mL Oral QID  . heparin subcutaneous  5,000 Units Subcutaneous 3 times per day  . hydrocortisone  25 mg Rectal BID  . hydrocortisone cream   Topical QID  . insulin aspart  0-15 Units Subcutaneous TID WC  . insulin aspart  0-5 Units Subcutaneous QHS  . insulin glargine  20 Units Subcutaneous QHS  . latanoprost  1 drop Both Eyes QPM  . metoprolol tartrate  12.5 mg Oral Q6H  . multivitamin with minerals  1 tablet Oral q morning - 10a  . nystatin  5 mL Oral QID  . nystatin   Topical BID  . potassium chloride  20 mEq Oral Daily  . saccharomyces boulardii  250 mg Oral BID  . sertraline  50 mg Oral Daily  . spironolactone  25 mg Oral Daily  . timolol   1 drop Both Eyes BID  . vancomycin  250 mg Oral 4 times per day   albuterol, alum & mag hydroxide-simeth, diphenhydrAMINE, enalaprilat, hydrALAZINE, labetalol, liver oil-zinc oxide, morphine injection, ondansetron (  ZOFRAN) IV, phenylephrine-shark liver oil-mineral oil-petrolatum, simethicone, traMADol  Assessment/ Plan:  [name] is a 81 y.o. Nhem female with diabetes mellitus type II, hypertension, congestive heart failure, hyperlipidemia, coronary artery disease, atrial fibrillation, GERD, mitral valve disorder who was admitted to [facility] on 06/21/2015 for C. Diff colitis. Patient was found to have a serum sodium of 122. Baseline of 132 (05/03/2015).   1. Acute hyponatremia on chronic hyponatremia:  Likely secondary to hypervolemia  2. Generalized Edema: with low albumin. Recent echo shows normal LVEF. Abdominal imaging did not show liver cirrhosis. - continue furosemide IV infusion. - albumin supplementation IV twice a day  3. Hypokalemia: due to IV furosemide. May contributing to GI distension.  - potassium chloride  - We'll add spironolactone for added diuresis and hopefully will help with potassium conservation  4. Acute kidney injury : creatinine now back to baseline. GFR greater than 60.  however may be dilutional from volume overload. - monitor volume status, urine output and renal function   5. Hypertension with atrial fibrillation with RVR:  - multiple meds, As Volume status improves, blood pressure expected to go lower, therefore lower doses of antihypertensives will be needed - Hydralazine discontinued     LOS: 16 Bren Borys 8/9/20164:18 PM

## 2015-07-07 NOTE — Plan of Care (Signed)
Problem: Phase II Progression Outcomes Goal: Case manager referral Pt is alert with confusion towards time, bedbound, requires maximum assistance for repositioning, generalized edema, continues on lasix drip, poor appetite, 1 small bm throughout shift, frequent skin care provided to bottom , vital signs stable, WBC improved since previous day, resting inbetween care, receiving po and IV antibiotics. Pt refused to work with physical therapy.

## 2015-07-07 NOTE — Progress Notes (Signed)
PT Cancellation Note  Patient Details Name: Katherine [name] MRN: [id number] DOB: 28-Dec-1931   Cancelled Treatment:    Reason Eval/Treat Not Completed: Fatigue/lethargy limiting ability to participate (Patient refused attempted treatment session, "I'm just so tired.  I can't even lift my arms.  Somebody always comes at the wrong time.  Two doctors just came by that told me to rest."  Unable to promote participation despite continued encouragement from therapist.  Will re-attempt at later time/date as medically appropriate and patient agreeable to paticipation.)   Jusitn Salsgiver H. Manson Passey, PT, DPT, NCS 07/07/2015, 11:02 AM [phone]

## 2015-07-07 NOTE — Progress Notes (Signed)
[facility] [place] at [facility]   PATIENT NAME: Katherine [name]    MR#:  [id number]  DATE OF BIRTH:  [birth year]  SUBJECTIVE:   Still have loose stool, found to have UTI. Generalized swelling and very weak.  REVIEW OF SYSTEMS:   Review of Systems  Constitutional: Negative for fever, chills and weight loss.  HENT: Negative for ear discharge, ear pain and nosebleeds.   Eyes: Negative for blurred vision, pain and discharge.  Respiratory: Negative for sputum production, shortness of breath, wheezing and stridor.   Cardiovascular: Positive for leg swelling. Negative for chest pain, palpitations, orthopnea and PND.  Gastrointestinal: Positive for diarrhea. Negative for vomiting and blood in stool.  Genitourinary: Negative for urgency and frequency.  Musculoskeletal: Negative for back pain and joint pain.  Neurological: Positive for weakness. Negative for sensory change, speech change and focal weakness.  Psychiatric/Behavioral: Negative for depression. The patient is not nervous/anxious.   All other systems reviewed and are negative.  Tolerating Diet: not able to eat much, have rashes in mouth. Tolerating PT: eval pending  DRUG ALLERGIES:   Allergies  Allergen Reactions  . Benzocaine Other (See Comments)    Unknown reaction  . Contrast Media [Iodinated Diagnostic Agents] Other (See Comments)    Tachycardia, SVT  . Other Other (See Comments)    MRI dye--reaction unknown.  Marland Kitchen Penicillin V Potassium Other (See Comments)  . Penicillins Other (See Comments)    Reaction: pt doesn't know, b/c it's been a long time ago.   . Sulfa Antibiotics Other (See Comments)    Reaction: pt doesn't know, b/c it's been a long time ago.    VITALS:  Blood pressure 160/88, pulse 104, temperature 97.7 F (36.5 C), temperature source Oral, resp. rate 20, height 5\' 3"  (1.6 m), weight 97.251 kg (214 lb 6.4 oz), SpO2 96 %. PHYSICAL EXAMINATION:  Physical Exam  Constitutional:  She is oriented to person, place, and time and well-developed, well-nourished, and in no distress.  HENT:  Head: Normocephalic and atraumatic.  Eyes: Conjunctivae and EOM are normal. Pupils are equal, round, and reactive to light.  Neck: Normal range of motion. Neck supple. No tracheal deviation present. No thyromegaly present.  Cardiovascular: Normal rate, regular rhythm and normal heart sounds.   Pulmonary/Chest: Effort normal and breath sounds normal. No respiratory distress. She has no wheezes. She exhibits no tenderness.  Abdominal: Soft. Bowel sounds are normal. She exhibits distension (gaseous). There is tenderness.  Musculoskeletal: Normal range of motion. She exhibits edema.  Neurological: She is alert and oriented to person, place, and time. No cranial nerve deficit.  Skin: Skin is warm and dry. No rash noted.  Psychiatric: Mood and affect normal.   LABORATORY PANEL:   CBC  Recent Labs Lab 07/07/15 0414  WBC 14.0*  HGB 8.8*  HCT 26.4*  PLT 171    Chemistries   Recent Labs Lab 07/06/15 1311 07/07/15 0414  NA  --  133*  K  --  3.6  CL  --  91*  CO2  --  32  GLUCOSE  --  213*  BUN  --  23*  CREATININE  --  0.75  CALCIUM  --  8.2*  MG 2.0  --     Cardiac Enzymes No results for input(s): TROPONINI in the last 168 hours. RADIOLOGY:  No results found. ASSESSMENT AND PLAN:  81 yr old W F with multiple medical problmes got out of rehab after having treated with IV abxs for  Orbital cellulitis. Presented after a fall at home and ongoing diarrhea.   * C. Diff: Collitis: still have diarrhea slightly better stool, Leukocytosis is better. continue PO vancomycin, Flagyl and imipenem were discontinued. Per Dr. Marva Panda  the patient is not a candidate for stool transplant.  started florastor. Slow gradual improvement.  * GIB possible due to hemmoroid:   ASA, heparin SQ, Hb is dropped to 7.3 ( 07/06/15)   Transfuse one unit PRBC ( 07/06/15)- Hb came up now.  * UTI   Started on levaquin 07/06/15- check for urine culture  * oral thrush   On nystatin oral.  * HYpokalemia  due to lasix IV  Replace oral and IV- recheck.  *  Acute hyponatremia on chronic hyponatremia: likely due to SIADH, given one dose of tolvaptan on July 29., i but worse for 2 days.so stopped, Increased lasix to 40 mg bid per Dr. Wynelle Link, Na is up to 130 , Follow-up BMP. As swelling is not responding- started on IV lasix drip ( 07/05/15) by Nephro and albumin infusion twice daily.  * DM. Blood sugar is better, continue Lantus and Continue sliding scale.  Hypoglycemia: Improved.  * A-fib with RVR.   off carddizem drip for 2 days, continue ASA,  Continue carvedilol, diltiazem, digoxin and lopreesor per Dr. Juliann Pares. Echo shows normal EF at 60%.  * left elbow pain after fall with abnromal xray. Improved. -appreciate ortho input - no pathology  * Aute kidney injury on chronic kidney disease stage III: improved and stable.  * Hypertension    continue benazepril, carvedilol, diltiazem, lopressor, clonidine, furosemide, hydralazine  IV hydralazine prn.  * Pleural effusion.  from volume overload and sepsis. continue Lasix  * Acute on chronic diastolic CHF (EF at 60-65%). Unclear etiology. Continue lasix and  Benazepril.  * Ileus. Secondary to severe C. difficile colitis.  Improving per abd xray. Advanced diet.  PT Evaluation suggested skilled nursing facility placement.  Discussed with Dr. Wynelle Link. Case discussed with Care Management/Social Worker. >50% time spent on counselling and coordination of care.  CODE STATUS: full  DVT Prophylaxis: heparin  TOTAL TIME TAKING CARE OF THIS PATIENT: 35 minutes.   POSSIBLE D/C IN 3-4 or more DAYS, DEPENDING ON CLINICAL CONDITION.   Altamese Dilling M.D on 07/07/2015 at 1:45 PM  Between 7am to 6pm - Pager - [phone]  After 6pm go to [email or web address] - password [email or web address] [facility]  [facility]  Office   [phone]  CC: Primary care physician; Mila Merry, MD

## 2015-07-08 LAB — BASIC METABOLIC PANEL
ANION GAP: 11 (ref 5–15)
BUN: 23 mg/dL — ABNORMAL HIGH (ref 6–20)
CALCIUM: 8.3 mg/dL — AB (ref 8.9–10.3)
CO2: 31 mmol/L (ref 22–32)
CREATININE: 0.82 mg/dL (ref 0.44–1.00)
Chloride: 92 mmol/L — ABNORMAL LOW (ref 101–111)
Glucose, Bld: 125 mg/dL — ABNORMAL HIGH (ref 65–99)
Potassium: 3 mmol/L — ABNORMAL LOW (ref 3.5–5.1)
Sodium: 134 mmol/L — ABNORMAL LOW (ref 135–145)

## 2015-07-08 LAB — CBC
HCT: 26.5 % — ABNORMAL LOW (ref 35.0–47.0)
HEMOGLOBIN: 8.7 g/dL — AB (ref 12.0–16.0)
MCH: 28.7 pg (ref 26.0–34.0)
MCHC: 33 g/dL (ref 32.0–36.0)
MCV: 87 fL (ref 80.0–100.0)
Platelets: 204 10*3/uL (ref 150–440)
RBC: 3.04 MIL/uL — ABNORMAL LOW (ref 3.80–5.20)
RDW: 17 % — ABNORMAL HIGH (ref 11.5–14.5)
WBC: 25.9 10*3/uL — AB (ref 3.6–11.0)

## 2015-07-08 LAB — URINE CULTURE: Culture: 30000

## 2015-07-08 LAB — GLUCOSE, CAPILLARY
GLUCOSE-CAPILLARY: 161 mg/dL — AB (ref 65–99)
GLUCOSE-CAPILLARY: 203 mg/dL — AB (ref 65–99)
GLUCOSE-CAPILLARY: 204 mg/dL — AB (ref 65–99)
Glucose-Capillary: 281 mg/dL — ABNORMAL HIGH (ref 65–99)

## 2015-07-08 MED ORDER — ZOLPIDEM TARTRATE 5 MG PO TABS
5.0000 mg | ORAL_TABLET | Freq: Every evening | ORAL | Status: DC | PRN
  Administered 2015-07-08: 5 mg via ORAL
  Filled 2015-07-08: qty 1

## 2015-07-08 MED ORDER — POTASSIUM CHLORIDE CRYS ER 20 MEQ PO TBCR
40.0000 meq | EXTENDED_RELEASE_TABLET | Freq: Every day | ORAL | Status: DC
  Administered 2015-07-09 – 2015-07-10 (×2): 40 meq via ORAL
  Filled 2015-07-08 (×3): qty 2

## 2015-07-08 MED ORDER — DILTIAZEM HCL ER COATED BEADS 180 MG PO CP24
360.0000 mg | ORAL_CAPSULE | Freq: Every day | ORAL | Status: DC
  Administered 2015-07-08 – 2015-07-13 (×6): 360 mg via ORAL
  Filled 2015-07-08 (×6): qty 2

## 2015-07-08 MED ORDER — POTASSIUM CHLORIDE CRYS ER 20 MEQ PO TBCR
40.0000 meq | EXTENDED_RELEASE_TABLET | ORAL | Status: AC
  Administered 2015-07-08 (×2): 40 meq via ORAL
  Filled 2015-07-08 (×2): qty 2

## 2015-07-08 MED ORDER — INSULIN GLARGINE 100 UNIT/ML ~~LOC~~ SOLN
26.0000 [IU] | Freq: Every day | SUBCUTANEOUS | Status: DC
  Administered 2015-07-08 – 2015-07-12 (×5): 26 [IU] via SUBCUTANEOUS
  Filled 2015-07-08 (×6): qty 0.26

## 2015-07-08 MED ORDER — ENOXAPARIN SODIUM 40 MG/0.4ML ~~LOC~~ SOLN
40.0000 mg | SUBCUTANEOUS | Status: DC
  Administered 2015-07-08 – 2015-07-13 (×6): 40 mg via SUBCUTANEOUS
  Filled 2015-07-08 (×6): qty 0.4

## 2015-07-08 NOTE — Clinical Social Work Note (Signed)
CSW notified [facility] that pt would not DC today.

## 2015-07-08 NOTE — Progress Notes (Signed)
Physical Therapy Treatment Patient Details Name: Katherine [name] MRN: [id number] DOB: 1932-07-15 Today's Date: 07/08/2015    History of Present Illness Pt is an 81 y.o. female admitted to the hospital with C-diff and acute renal failure.  Pt with recent hospital stay at [facility] for eye infection and heart problems and discharged to [facility].  Pt reports fall at [facility] and also fall after discharging to home (L knee and hip imaging negative for fracture; L elbow x-ray report concerning for fracture but per ortho consult:  benign exam of elbow, no specific tenderness, unlikely to have fracture, recommend activity as tolerated). Pt was then found to have rapid ventricular response which brought her to the ICU. Just recently transfered back to telemetry.     PT Comments    Patient with improved activity tolerance, improved participation with session this date.  Able to initiate transition to edge of bed and sit/stand with RW; requiring mod/max assist +2 for all mobility.  Patient remains globally weak and deconditioned; fatigues very quickly.  Anticipate prolonged recovery time.   Follow Up Recommendations  SNF     Equipment Recommendations       Recommendations for Other Services       Precautions / Restrictions Precautions Precautions: Fall Precaution Comments: enteric isolation Restrictions Weight Bearing Restrictions: No    Mobility  Bed Mobility Overal bed mobility: Needs Assistance;+2 for physical assistance Bed Mobility: Rolling;Supine to Sit;Sit to Supine Rolling: Min assist;Mod assist   Supine to sit: Mod assist;Max assist;+2 for physical assistance Sit to supine: Max assist;+2 for physical assistance   General bed mobility comments: patient globally weak and deconditioned; increased time, verbal cuing required to initiate all functional activities  Transfers Overall transfer level: Needs assistance   Transfers: Sit to/from Stand Sit to Stand: Mod assist;+2 physical  assistance         General transfer comment: assist for lift off, standing balance and overall postural extension  Ambulation/Gait             General Gait Details: unable to tolerate at this time   Stairs            Wheelchair Mobility    Modified Rankin (Stroke Patients Only)       Balance Overall balance assessment: Needs assistance Sitting-balance support: No upper extremity supported;Feet supported Sitting balance-Leahy Scale: Fair     Standing balance support: Bilateral upper extremity supported Standing balance-Leahy Scale: Poor                      Cognition Arousal/Alertness: Awake/alert                          Exercises Other Exercises Other Exercises: Supine LE therex, 1x8-10: ankle pumps, SAQs, heel slides, hip abduct/adduct.  LEs generally edematous, distal > proximal (2-3+ pitting edema) Other Exercises: Sit/stand x3 with RW, mod assist +2 for lift off, postural control, static balance.  Unable to reach full postural extension; unable to tolerate beyond 10 seconds each trial secondary to fatigue.    General Comments        Pertinent Vitals/Pain Pain Assessment: Faces Pain Score: 4  Pain Location: buttocks Pain Descriptors / Indicators: Burning;Sore Pain Intervention(s): Limited activity within patient's tolerance;Monitored during session;Repositioned    Home Living Family/patient expects to be discharged to:: Skilled nursing facility                    Prior  Function            PT Goals (current goals can now be found in the care plan section) Acute Rehab PT Goals Patient Stated Goal: "I want to try" PT Goal Formulation: With patient Time For Goal Achievement: 07/21/15 Potential to Achieve Goals: Fair    Frequency  Min 2X/week    PT Plan      Co-evaluation             End of Session   Activity Tolerance: Patient limited by lethargy Patient left: in bed;with call bell/phone within  reach;with bed alarm set     Time: 4098-1191 PT Time Calculation (min) (ACUTE ONLY): 23 min  Charges:  $Therapeutic Exercise: 8-22 mins $Therapeutic Activity: 8-22 mins                    G Codes:      Cyrena Kuchenbecker H. Manson Passey, PT, DPT, NCS 07/08/2015, 11:19 AM [phone]

## 2015-07-08 NOTE — Progress Notes (Signed)
[facility] - [place] at [facility]   PATIENT NAME: Katherine [name]    MR#:  [id number]  DATE OF BIRTH:  02/03/1932  SUBJECTIVE:   Admitted for c diff. Still weak, poor appetite. Diarrhea with some improvement. O lasix drip with albumin. Afebrile. Mild diffuse abd pain.  REVIEW OF SYSTEMS:   Review of Systems  Constitutional: Negative for fever, chills and weight loss.  HENT: Negative for ear discharge, ear pain and nosebleeds.   Eyes: Negative for blurred vision, pain and discharge.  Respiratory: Negative for sputum production, shortness of breath, wheezing and stridor.   Cardiovascular: Positive for leg swelling. Negative for chest pain, palpitations, orthopnea and PND.  Gastrointestinal: Positive for diarrhea. Negative for vomiting and blood in stool.  Genitourinary: Negative for urgency and frequency.  Musculoskeletal: Negative for back pain and joint pain.  Neurological: Positive for weakness. Negative for sensory change, speech change and focal weakness.  Psychiatric/Behavioral: Negative for depression. The patient is not nervous/anxious.   All other systems reviewed and are negative.   DRUG ALLERGIES:   Allergies  Allergen Reactions  . Benzocaine Other (See Comments)    Unknown reaction  . Contrast Media [Iodinated Diagnostic Agents] Other (See Comments)    Tachycardia, SVT  . Other Other (See Comments)    MRI dye--reaction unknown.  Marland Kitchen Penicillin V Potassium Other (See Comments)  . Penicillins Other (See Comments)    Reaction: pt doesn't know, b/c it's been a long time ago.   . Sulfa Antibiotics Other (See Comments)    Reaction: pt doesn't know, b/c it's been a long time ago.    VITALS:  Blood pressure 142/103, pulse 40, temperature 97.8 F (36.6 C), temperature source Oral, resp. rate 28, height  (1.6 m), weight 90.629 kg (199 lb 12.8 oz), SpO2 95 %. PHYSICAL EXAMINATION:  Physical Exam  Constitutional: She is oriented to  person, place, and time and well-developed, well-nourished, and in no distress.  HENT:  Head: Normocephalic and atraumatic.  Eyes: Conjunctivae and EOM are normal. Pupils are equal, round, and reactive to light.  Neck: Normal range of motion. Neck supple. No tracheal deviation present. No thyromegaly present.  Cardiovascular: Normal rate, regular rhythm and normal heart sounds.   Pulmonary/Chest: Effort normal and breath sounds normal. No respiratory distress. She has no wheezes. She exhibits no tenderness.  Abdominal: Soft. Bowel sounds are normal. She exhibits distension (gaseous). There is tenderness.  Musculoskeletal: Normal range of motion. She exhibits edema.  Neurological: She is alert and oriented to person, place, and time. No cranial nerve deficit.  Skin: Skin is warm and dry. No rash noted.  Psychiatric: Mood and affect normal.  Pallor positive  LABORATORY PANEL:   CBC  Recent Labs Lab 07/08/15 0518  WBC 25.9*  HGB 8.7*  HCT 26.5*  PLT 204    Chemistries   Recent Labs Lab 07/06/15 1311  07/08/15 0435  NA  --   < > 134*  K  --   < > 3.0*  CL  --   < > 92*  CO2  --   < > 31  GLUCOSE  --   < > 125*  BUN  --   < > 23*  CREATININE  --   < > 0.82  CALCIUM  --   < > 8.3*  MG 2.0  --   --   < > = values in this interval not displayed.  Cardiac Enzymes No results for input(s): TROPONINI in the  last 168 hours. RADIOLOGY:  No results found. ASSESSMENT AND PLAN:  81 yr old W F with multiple medical problmes got out of rehab after having treated with IV abxs for Orbital cellulitis. Presented after a fall at home and ongoing diarrhea.   * C. Diff: Collitis: Diarrhea some improvement. On PO vancomycin. WBC worse today. Florostar started. GI helping with managing.  * Leucocytosis Likely from C diff Worsening now. Check Blood cx. No new abx at this time  * GIB possible due to hemmoroid:   ASA, heparin SQ, Hb is dropped to 7.3 ( 07/06/15)   Transfuse one unit  PRBC ( 07/06/15)- Hb came up now.  * UTI  Started on levaquin 07/06/15- Await urine culture  * oral thrush   On nystatin oral.  * Hypokalemia  due to lasix IV  Replace oral and IV- recheck.  *  Acute on chronic hypervolemic hyponatremia Improving with lasix  * DM. Blood sugar is better, continue Lantus and Continue sliding scale.  Hypoglycemia: Improved.  * A-fib with RVR.   off carddizem drip for 2 days, continue ASA,  Continue carvedilol, diltiazem, digoxin. D/C lopressor. Increase dose of cardizem Echo shows normal EF at 60%.  * left elbow pain after fall with abnromal xray. Improved. -appreciate ortho input - no pathology  * Hypertension    continue benazepril, carvedilol, diltiazem, lopressor, clonidine, furosemide, hydralazine  IV hydralazine prn.  * Pleural effusion.  from volume overload and sepsis. continue Lasix  * Acute on chronic diastolic CHF (EF at 60-65%). Unclear etiology. Continue lasix and  Benazepril.  * Ileus. Secondary to severe C. difficile colitis.  Improving per abd xray. Advanced diet.  PT Evaluation suggested skilled nursing facility placement.  Discussed with Dr. Thedore Mins  CODE STATUS: full  DVT Prophylaxis: heparin  TOTAL TIME TAKING CARE OF THIS PATIENT: 35 minutes.    Milagros Loll R M.D on 07/08/2015 at 12:35 PM  Between 7am to 6pm - Pager - [phone]  After 6pm go to [email or web address] - password [email or web address] [facility]  [facility] [place] [facility]  Office  [phone]  CC: Primary care physician; Mila Merry, MD

## 2015-07-08 NOTE — Progress Notes (Signed)
Subjective:   Patient continues to have complex multiple medical issues. Currently on Lasix via IV infusion. Good response. Urine output 1950 cc Does not have any specific complaints except feeling tired, inability to sleep   Objective:  Vital signs in last 24 hours:  Temp:  [97.8 F (36.6 C)-98 F (36.7 C)] 97.8 F (36.6 C) (08/10 1054) Pulse Rate:  [40-110] 40 (08/10 1054) Resp:  [18-28] 28 (08/10 1054) BP: (142-174)/(65-103) 142/103 mmHg (08/10 1054) SpO2:  [95 %-97 %] 95 % (08/10 1112) Weight:  [90.629 kg (199 lb 12.8 oz)] 90.629 kg (199 lb 12.8 oz) (08/10 0413)  Weight change:  Filed Weights   06/21/15 1508 07/02/15 0427 07/08/15 0413  Weight: 96.616 kg (213 lb) 97.251 kg (214 lb 6.4 oz) 90.629 kg (199 lb 12.8 oz)    Intake/Output: I/O last 3 completed shifts: In: 295.9 [I.V.:195.9; IV Piggyback:100] Out: 3500 [Urine:3500]   Intake/Output this shift:  Total I/O In: -  Out: 200 [Urine:200]  Physical Exam: General: NAD  Head: Normocephalic, atraumatic. Moist oral mucosal membranes  Eyes: Anicteric,  Neck: Supple, trachea midline  Lungs:  Clear to auscultation  Heart: Irregular  Abdomen:  Soft, tender to palpation, +bowel sounds, mildly distended   Extremities: 3+ peripheral and dependent edema in all four extremities  Neurologic: Nonfocal, moving all four extremities  Skin: No acute lesions    Foley present     Basic Metabolic Panel:  Recent Labs Lab 07/03/15 0855 07/04/15 0030 07/06/15 0352 07/06/15 1311 07/07/15 0414 07/08/15 0435  NA 130* 129* 132*  --  133* 134*  K 4.1 3.9 2.6*  --  3.6 3.0*  CL 93* 92* 93*  --  91* 92*  CO2 30 29 31   --  32 31  GLUCOSE 179* 198* 99  --  213* 125*  BUN 30* 30* 25*  --  23* 23*  CREATININE 0.88 0.81 0.64  --  0.75 0.82  CALCIUM 7.9* 7.6* 7.9*  --  8.2* 8.3*  MG  --   --   --  2.0  --   --     Liver Function Tests:  Recent Labs Lab 07/02/15 0848  ALBUMIN 2.2*   No results for input(s): LIPASE,  AMYLASE in the last 168 hours. No results for input(s): AMMONIA in the last 168 hours.  CBC:  Recent Labs Lab 07/03/15 0855 07/04/15 0030 07/06/15 0352 07/07/15 0414 07/08/15 0518  WBC 17.2* 15.6* 19.4* 14.0* 25.9*  HGB 9.1* 8.7* 7.3* 8.8* 8.7*  HCT 27.9* 25.9* 21.6* 26.4* 26.5*  MCV 85.6 85.3 85.9 87.1 87.0  PLT 199 181 155 171 204    Cardiac Enzymes: No results for input(s): CKTOTAL, CKMB, CKMBINDEX, TROPONINI in the last 168 hours.  BNP: Invalid input(s): POCBNP  CBG:  Recent Labs Lab 07/07/15 1126 07/07/15 1638 07/07/15 2043 07/08/15 0735 07/08/15 1056  GLUCAP 253* 167* 222* 161* 203*    Microbiology: Results for orders placed or performed during the hospital encounter of 06/21/15  Stool culture     Status: None   Collection Time: 06/21/15  5:17 PM  Result Value Ref Range Status   Specimen Description STOOL  Final   Special Requests Normal  Final   Culture   Final    NO SALMONELLA OR SHIGELLA ISOLATED No Pathogenic E. coli detected NO CAMPYLOBACTER DETECTED    Report Status 06/23/2015 FINAL  Final  C difficile quick scan w PCR reflex ([facility] only)     Status: Abnormal   Collection Time: 06/21/15  5:17 PM  Result Value Ref Range Status   C Diff antigen POSITIVE (A) NEGATIVE Final   C Diff toxin POSITIVE (A) NEGATIVE Final   C Diff interpretation   Final    Positive for toxigenic C. difficile, active toxin production present.    Comment: CRITICAL RESULT CALLED TO, READ BACK BY AND VERIFIED WITH: DONALD SWEENEY ON 06/21/15 AT 1755 BY JEF   MRSA PCR Screening     Status: None   Collection Time: 06/25/15 10:48 AM  Result Value Ref Range Status   MRSA by PCR NEGATIVE NEGATIVE Final    Comment:        The GeneXpert MRSA Assay (FDA approved for NASAL specimens only), is one component of a comprehensive MRSA colonization surveillance program. It is not intended to diagnose MRSA infection nor to guide or monitor treatment for MRSA infections.   Urine  culture     Status: None   Collection Time: 07/06/15 11:38 AM  Result Value Ref Range Status   Specimen Description URINE, RANDOM  Final   Special Requests NONE  Final   Culture 30,000 COLONIES/mL PROTEUS MIRABILIS  Final   Report Status 07/08/2015 FINAL  Final   Organism ID, Bacteria PROTEUS MIRABILIS  Final      Susceptibility   Proteus mirabilis - MIC*    AMPICILLIN <=2 SENSITIVE Sensitive     CEFTAZIDIME <=1 SENSITIVE Sensitive     CEFAZOLIN <=4 SENSITIVE Sensitive     CEFTRIAXONE <=1 SENSITIVE Sensitive     CIPROFLOXACIN <=0.25 SENSITIVE Sensitive     GENTAMICIN <=1 SENSITIVE Sensitive     IMIPENEM 2 SENSITIVE Sensitive     TRIMETH/SULFA <=20 SENSITIVE Sensitive     NITROFURANTOIN Value in next row Resistant      RESISTANT128    PIP/TAZO Value in next row Sensitive      SENSITIVE<=4    * 30,000 COLONIES/mL PROTEUS MIRABILIS    Coagulation Studies: No results for input(s): LABPROT, INR in the last 72 hours.  Urinalysis:  Recent Labs  07/05/15 1314  COLORURINE AMBER*  LABSPEC 1.009  PHURINE 9.0*  GLUCOSEU NEGATIVE  HGBUR 3+*  BILIRUBINUR NEGATIVE  KETONESUR NEGATIVE  PROTEINUR 100*  NITRITE NEGATIVE  LEUKOCYTESUR 3+*      Imaging: No results found.   Medications:   . furosemide (LASIX) infusion 8 mg/hr (07/08/15 0420)   . acidophilus  1 capsule Oral BID  . albumin human  12.5 g Intravenous BID  . antiseptic oral rinse  7 mL Mouth Rinse BID  . aspirin  81 mg Oral Daily  . atorvastatin  80 mg Oral QPM  . benazepril  40 mg Oral BID  . carvedilol  25 mg Oral BID  . cefTRIAXone (ROCEPHIN)  IV  1 g Intravenous Q24H  . digoxin  0.25 mg Oral Daily  . diltiazem  360 mg Oral Daily  . dorzolamide  1 drop Both Eyes TID  . famotidine  20 mg Oral Q supper  . feeding supplement (GLUCERNA SHAKE)  237 mL Oral QID  . heparin subcutaneous  5,000 Units Subcutaneous 3 times per day  . hydrocortisone  25 mg Rectal BID  . hydrocortisone cream   Topical QID  . insulin  aspart  0-15 Units Subcutaneous TID WC  . insulin aspart  0-5 Units Subcutaneous QHS  . insulin glargine  20 Units Subcutaneous QHS  . latanoprost  1 drop Both Eyes QPM  . multivitamin with minerals  1 tablet Oral q morning - 10a  .  nystatin  5 mL Oral QID  . nystatin   Topical BID  . potassium chloride  40 mEq Oral 6 times per day  . [START ON 07/09/2015] potassium chloride  40 mEq Oral Daily  . saccharomyces boulardii  250 mg Oral BID  . sertraline  50 mg Oral Daily  . spironolactone  25 mg Oral Daily  . timolol  1 drop Both Eyes BID  . vancomycin  250 mg Oral 4 times per day   albuterol, alum & mag hydroxide-simeth, diphenhydrAMINE, enalaprilat, labetalol, liver oil-zinc oxide, morphine injection, ondansetron (ZOFRAN) IV, phenylephrine-shark liver oil-mineral oil-petrolatum, simethicone, traMADol  Assessment/ Plan:  [name] is a 81 y.o. [place] female with diabetes mellitus type II, hypertension, congestive heart failure, hyperlipidemia, coronary artery disease, atrial fibrillation, GERD, mitral valve disorder who was admitted to [facility] on 06/21/2015 for C. Diff colitis. Patient was found to have a serum sodium of 122. Baseline of 132 (05/03/2015).   1. Acute hyponatremia on chronic hyponatremia:  Likely secondary to hypervolemia  2. Generalized Edema: with low albumin. Recent echo shows normal LVEF. Abdominal imaging did not show liver cirrhosis. - continue furosemide IV infusion. - albumin supplementation IV twice a day  3. Hypokalemia: due to IV furosemide. May contributing to GI distension.  - potassium chloride  - Continue spironolactone for added diuresis and hopefully will help with potassium conservation  4. Acute kidney injury : creatinine now back to baseline.  - monitor volume status, urine output and renal function   5. Hypertension with atrial fibrillation with RVR:  - multiple meds, As Volume status improves, blood pressure expected to go lower, therefore  lower doses of antihypertensives will be needed - Hydralazine discontinued     LOS: 17 Ned Kakar 8/10/201611:17 AM

## 2015-07-08 NOTE — Progress Notes (Signed)
Patient is alert and oriented. No complaints of pain except when she has gas and her abdomen gets tight. No need of pain medication. Patient has had 7 loose bowel movements with some substance so far this shift. Still on IV lasix at 41mL/hr. Urine culture is positive, receiving antibiotics. Will continue to monitor.

## 2015-07-09 LAB — GLUCOSE, CAPILLARY
GLUCOSE-CAPILLARY: 196 mg/dL — AB (ref 65–99)
GLUCOSE-CAPILLARY: 217 mg/dL — AB (ref 65–99)
GLUCOSE-CAPILLARY: 220 mg/dL — AB (ref 65–99)
Glucose-Capillary: 227 mg/dL — ABNORMAL HIGH (ref 65–99)

## 2015-07-09 LAB — CBC
HCT: 25 % — ABNORMAL LOW (ref 35.0–47.0)
Hemoglobin: 8.2 g/dL — ABNORMAL LOW (ref 12.0–16.0)
MCH: 28.9 pg (ref 26.0–34.0)
MCHC: 32.7 g/dL (ref 32.0–36.0)
MCV: 88.4 fL (ref 80.0–100.0)
PLATELETS: 205 10*3/uL (ref 150–440)
RBC: 2.83 MIL/uL — ABNORMAL LOW (ref 3.80–5.20)
RDW: 17.3 % — ABNORMAL HIGH (ref 11.5–14.5)
WBC: 14.9 10*3/uL — ABNORMAL HIGH (ref 3.6–11.0)

## 2015-07-09 LAB — BASIC METABOLIC PANEL
ANION GAP: 9 (ref 5–15)
BUN: 23 mg/dL — ABNORMAL HIGH (ref 6–20)
CALCIUM: 8.1 mg/dL — AB (ref 8.9–10.3)
CO2: 33 mmol/L — AB (ref 22–32)
Chloride: 91 mmol/L — ABNORMAL LOW (ref 101–111)
Creatinine, Ser: 0.72 mg/dL (ref 0.44–1.00)
GFR calc non Af Amer: >60 mL/min (ref >60)
Glucose, Bld: 199 mg/dL — ABNORMAL HIGH (ref 65–99)
Potassium: 2.9 mmol/L — CL (ref 3.5–5.1)
SODIUM: 133 mmol/L — AB (ref 135–145)

## 2015-07-09 LAB — MAGNESIUM: Magnesium: 1.9 mg/dL (ref 1.7–2.4)

## 2015-07-09 LAB — POTASSIUM: POTASSIUM: 3.7 mmol/L (ref 3.5–5.1)

## 2015-07-09 MED ORDER — POTASSIUM CHLORIDE CRYS ER 20 MEQ PO TBCR
40.0000 meq | EXTENDED_RELEASE_TABLET | Freq: Once | ORAL | Status: AC
  Administered 2015-07-10: 40 meq via ORAL
  Filled 2015-07-09: qty 2

## 2015-07-09 MED ORDER — CLONIDINE HCL 0.1 MG PO TABS
0.1000 mg | ORAL_TABLET | Freq: Two times a day (BID) | ORAL | Status: DC
  Administered 2015-07-09 (×2): 0.1 mg via ORAL
  Filled 2015-07-09 (×2): qty 1

## 2015-07-09 MED ORDER — POTASSIUM CHLORIDE CRYS ER 20 MEQ PO TBCR
40.0000 meq | EXTENDED_RELEASE_TABLET | ORAL | Status: AC
  Administered 2015-07-09: 40 meq via ORAL

## 2015-07-09 MED ORDER — METRONIDAZOLE 500 MG PO TABS
500.0000 mg | ORAL_TABLET | Freq: Three times a day (TID) | ORAL | Status: DC
  Administered 2015-07-09 – 2015-07-13 (×13): 500 mg via ORAL
  Filled 2015-07-09 (×13): qty 1

## 2015-07-09 NOTE — Consult Note (Signed)
Pt asleep when I checked on her.  Nurses report one stool today.  Abd distended but soft, non tender.  Ext with 1+ edema.  WBC down to 14.9 from 25 yesterday, may have been related to UTI.  VS P. 90, BP 132/74, Sat 96% on RA. Hgb 8.2.  Pt improving and doing it slowly.  No new suggestions.

## 2015-07-09 NOTE — Progress Notes (Signed)
Environmental services informed RN that patient refused her room to be cleaned with bleach. Educated the patient on the importance of the bleach due to her cdiff. Patient stated "I can't breathe when they clean my room with that stuff" and refused.

## 2015-07-09 NOTE — Progress Notes (Signed)
Subjective:   Patient continues to have complex multiple medical issues. Currently on Lasix via IV infusion. Good response. Urine output 1450 cc, but Foley bag was full when seen Does not have any specific complaints except feeling tired, inability to sleep   Objective:  Vital signs in last 24 hours:  Temp:  [97.7 F (36.5 C)-97.9 F (36.6 C)] 97.9 F (36.6 C) (08/11 1153) Pulse Rate:  [73-94] 90 (08/11 1153) Resp:  [20] 20 (08/11 1153) BP: (132-191)/(69-84) 132/73 mmHg (08/11 1153) SpO2:  [93 %-97 %] 96 % (08/11 1153) Weight:  [90.583 kg (199 lb 11.2 oz)] 90.583 kg (199 lb 11.2 oz) (08/11 0549)  Weight change: -0.045 kg (-1.6 oz) Filed Weights   07/02/15 0427 07/08/15 0413 07/09/15 0549  Weight: 97.251 kg (214 lb 6.4 oz) 90.629 kg (199 lb 12.8 oz) 90.583 kg (199 lb 11.2 oz)    Intake/Output: I/O last 3 completed shifts: In: 428 [I.V.:228; IV Piggyback:200] Out: 2400 [Urine:2400]   Intake/Output this shift:  Total I/O In: -  Out: 475 [Urine:475]  Physical Exam: General: NAD  Head: Normocephalic, atraumatic. Moist oral mucosal membranes  Eyes: Anicteric,  Neck: Supple, trachea midline  Lungs:  Clear to auscultation  Heart: Irregular  Abdomen:  Soft, tender to palpation, +bowel sounds, mildly distended   Extremities: 3+ peripheral and dependent edema in all four extremities  Neurologic: Nonfocal, moving all four extremities  Skin: No acute lesions    Foley present     Basic Metabolic Panel:  Recent Labs Lab 07/04/15 0030 07/06/15 0352 07/06/15 1311 07/07/15 0414 07/08/15 0435 07/09/15 0415 07/09/15 1439  NA 129* 132*  --  133* 134* 133*  --   K 3.9 2.6*  --  3.6 3.0* 2.9* 3.7  CL 92* 93*  --  91* 92* 91*  --   CO2 29 31  --  32 31 33*  --   GLUCOSE 198* 99  --  213* 125* 199*  --   BUN 30* 25*  --  23* 23* 23*  --   CREATININE 0.81 0.64  --  0.75 0.82 0.72  --   CALCIUM 7.6* 7.9*  --  8.2* 8.3* 8.1*  --   MG  --   --  2.0  --   --   --  1.9     Liver Function Tests: No results for input(s): AST, ALT, ALKPHOS, BILITOT, PROT, ALBUMIN in the last 168 hours. No results for input(s): LIPASE, AMYLASE in the last 168 hours. No results for input(s): AMMONIA in the last 168 hours.  CBC:  Recent Labs Lab 07/04/15 0030 07/06/15 0352 07/07/15 0414 07/08/15 0518 07/09/15 0415  WBC 15.6* 19.4* 14.0* 25.9* 14.9*  HGB 8.7* 7.3* 8.8* 8.7* 8.2*  HCT 25.9* 21.6* 26.4* 26.5* 25.0*  MCV 85.3 85.9 87.1 87.0 88.4  PLT 181 155 171 204 205    Cardiac Enzymes: No results for input(s): CKTOTAL, CKMB, CKMBINDEX, TROPONINI in the last 168 hours.  BNP: Invalid input(s): POCBNP  CBG:  Recent Labs Lab 07/08/15 1056 07/08/15 1623 07/08/15 1943 07/09/15 0754 07/09/15 1150  GLUCAP 203* 204* 281* 196* 217*    Microbiology: Results for orders placed or performed during the hospital encounter of 06/21/15  Stool culture     Status: None   Collection Time: 06/21/15  5:17 PM  Result Value Ref Range Status   Specimen Description STOOL  Final   Special Requests Normal  Final   Culture   Final    NO SALMONELLA  OR SHIGELLA ISOLATED No Pathogenic E. coli detected NO CAMPYLOBACTER DETECTED    Report Status 06/23/2015 FINAL  Final  C difficile quick scan w PCR reflex ([facility] only)     Status: Abnormal   Collection Time: 06/21/15  5:17 PM  Result Value Ref Range Status   C Diff antigen POSITIVE (A) NEGATIVE Final   C Diff toxin POSITIVE (A) NEGATIVE Final   C Diff interpretation   Final    Positive for toxigenic C. difficile, active toxin production present.    Comment: CRITICAL RESULT CALLED TO, READ BACK BY AND VERIFIED WITH: DONALD SWEENEY ON 06/21/15 AT 1755 BY JEF   MRSA PCR Screening     Status: None   Collection Time: 06/25/15 10:48 AM  Result Value Ref Range Status   MRSA by PCR NEGATIVE NEGATIVE Final    Comment:        The GeneXpert MRSA Assay (FDA approved for NASAL specimens only), is one component of a comprehensive  MRSA colonization surveillance program. It is not intended to diagnose MRSA infection nor to guide or monitor treatment for MRSA infections.   Urine culture     Status: None   Collection Time: 07/06/15 11:38 AM  Result Value Ref Range Status   Specimen Description URINE, RANDOM  Final   Special Requests NONE  Final   Culture 30,000 COLONIES/mL PROTEUS MIRABILIS  Final   Report Status 07/08/2015 FINAL  Final   Organism ID, Bacteria PROTEUS MIRABILIS  Final      Susceptibility   Proteus mirabilis - MIC*    AMPICILLIN <=2 SENSITIVE Sensitive     CEFTAZIDIME <=1 SENSITIVE Sensitive     CEFAZOLIN <=4 SENSITIVE Sensitive     CEFTRIAXONE <=1 SENSITIVE Sensitive     CIPROFLOXACIN <=0.25 SENSITIVE Sensitive     GENTAMICIN <=1 SENSITIVE Sensitive     IMIPENEM 2 SENSITIVE Sensitive     TRIMETH/SULFA <=20 SENSITIVE Sensitive     NITROFURANTOIN Value in next row Resistant      RESISTANT128    PIP/TAZO Value in next row Sensitive      SENSITIVE<=4    AMPICILLIN/SULBACTAM Value in next row Sensitive      SENSITIVE<=2    * 30,000 COLONIES/mL PROTEUS MIRABILIS  Culture, blood (routine x 2)     Status: None (Preliminary result)   Collection Time: 07/08/15  8:27 AM  Result Value Ref Range Status   Specimen Description BLOOD RIGHT HAND  Final   Special Requests BOTTLES DRAWN AEROBIC AND ANAEROBIC 10CC  Final   Culture NO GROWTH < 24 HOURS  Final   Report Status PENDING  Incomplete  Culture, blood (routine x 2)     Status: None (Preliminary result)   Collection Time: 07/08/15  8:34 AM  Result Value Ref Range Status   Specimen Description BLOOD RIGHT HAND  Final   Special Requests BOTTLES DRAWN AEROBIC AND ANAEROBIC 7CC  Final   Culture NO GROWTH < 24 HOURS  Final   Report Status PENDING  Incomplete    Coagulation Studies: No results for input(s): LABPROT, INR in the last 72 hours.  Urinalysis: No results for input(s): COLORURINE, LABSPEC, PHURINE, GLUCOSEU, HGBUR, BILIRUBINUR,  KETONESUR, PROTEINUR, UROBILINOGEN, NITRITE, LEUKOCYTESUR in the last 72 hours.  Invalid input(s): APPERANCEUR    Imaging: No results found.   Medications:   . furosemide (LASIX) infusion 8 mg/hr (07/09/15 1023)   . acidophilus  1 capsule Oral BID  . albumin human  12.5 g Intravenous BID  . antiseptic  oral rinse  7 mL Mouth Rinse BID  . aspirin  81 mg Oral Daily  . atorvastatin  80 mg Oral QPM  . benazepril  40 mg Oral BID  . carvedilol  25 mg Oral BID  . cloNIDine  0.1 mg Oral BID  . digoxin  0.25 mg Oral Daily  . diltiazem  360 mg Oral Daily  . dorzolamide  1 drop Both Eyes TID  . enoxaparin (LOVENOX) injection  40 mg Subcutaneous Q24H  . famotidine  20 mg Oral Q supper  . feeding supplement (GLUCERNA SHAKE)  237 mL Oral QID  . hydrocortisone  25 mg Rectal BID  . hydrocortisone cream   Topical QID  . insulin aspart  0-15 Units Subcutaneous TID WC  . insulin aspart  0-5 Units Subcutaneous QHS  . insulin glargine  26 Units Subcutaneous QHS  . latanoprost  1 drop Both Eyes QPM  . metroNIDAZOLE  500 mg Oral 3 times per day  . multivitamin with minerals  1 tablet Oral q morning - 10a  . nystatin  5 mL Oral QID  . nystatin   Topical BID  . potassium chloride  40 mEq Oral Daily  . saccharomyces boulardii  250 mg Oral BID  . sertraline  50 mg Oral Daily  . spironolactone  25 mg Oral Daily  . timolol  1 drop Both Eyes BID  . vancomycin  250 mg Oral 4 times per day   albuterol, alum & mag hydroxide-simeth, diphenhydrAMINE, enalaprilat, labetalol, liver oil-zinc oxide, morphine injection, ondansetron (ZOFRAN) IV, phenylephrine-shark liver oil-mineral oil-petrolatum, simethicone, traMADol, zolpidem  Assessment/ Plan:  Ms. SAMHITHA ROSEN is a 81 y.o. [place] female with diabetes mellitus type II, hypertension, congestive heart failure, hyperlipidemia, coronary artery disease, atrial fibrillation, GERD, mitral valve disorder who was admitted to [facility] on 06/21/2015 for C. Diff  colitis. Patient was found to have a serum sodium of 122. Baseline of 132 (05/03/2015).   1.  Generalized Edema: with low albumin. Recent echo shows normal LVEF. Abdominal imaging did not show liver cirrhosis. - continue furosemide IV infusion. - albumin supplementation IV twice a day - wt 90.5 kg  2. Hyponatremia - improved  3. Hypokalemia: due to IV furosemide. May contributing to GI distension.  - potassium chloride  - Continue spironolactone for added diuresis and hopefully will help with potassium conservation  4. Acute kidney injury : creatinine now back to baseline.  - monitor volume status, urine output and renal function   5. Hypertension with atrial fibrillation with RVR:  - multiple meds, As Volume status improves, blood pressure expected to go lower, therefore lower doses of antihypertensives will be needed - Hydralazine discontinued     LOS: 18 Deunta Beneke 8/11/20163:10 PM

## 2015-07-09 NOTE — Progress Notes (Signed)
[facility] - [place] at [facility]   PATIENT NAME: Katherine [name]    MR#:  [id number]  DATE OF BIRTH:  12-24-1931  SUBJECTIVE:   Admitted for c diff. Still weak, poor appetite. Diarrhea with some improvement. O lasix drip with albumin. Afebrile. Mild diffuse abd pain.  REVIEW OF SYSTEMS:   Review of Systems  Constitutional: Negative for fever, chills and weight loss.  HENT: Negative for ear discharge, ear pain and nosebleeds.   Eyes: Negative for blurred vision, pain and discharge.  Respiratory: Negative for sputum production, shortness of breath, wheezing and stridor.   Cardiovascular: Positive for leg swelling. Negative for chest pain, palpitations, orthopnea and PND.  Gastrointestinal: Positive for diarrhea. Negative for vomiting and blood in stool.  Genitourinary: Negative for urgency and frequency.  Musculoskeletal: Negative for back pain and joint pain.  Neurological: Positive for weakness. Negative for sensory change, speech change and focal weakness.  Psychiatric/Behavioral: Negative for depression. The patient is not nervous/anxious.   All other systems reviewed and are negative.   DRUG ALLERGIES:   Allergies  Allergen Reactions  . Benzocaine Other (See Comments)    Unknown reaction  . Contrast Media [Iodinated Diagnostic Agents] Other (See Comments)    Tachycardia, SVT  . Other Other (See Comments)    MRI dye--reaction unknown.  Marland Kitchen Penicillin V Potassium Other (See Comments)  . Penicillins Other (See Comments)    Reaction: pt doesn't know, b/c it's been a long time ago.   . Sulfa Antibiotics Other (See Comments)    Reaction: pt doesn't know, b/c it's been a long time ago.    VITALS:  Blood pressure 132/73, pulse 90, temperature 97.9 F (36.6 C), temperature source Oral, resp. rate 20, height  (1.6 m), weight 90.583 kg (199 lb 11.2 oz), SpO2 96 %. PHYSICAL EXAMINATION:  Physical Exam  Constitutional: She is oriented to  person, place, and time and well-developed, well-nourished, and in no distress.  HENT:  Head: Normocephalic and atraumatic.  Eyes: Conjunctivae and EOM are normal. Pupils are equal, round, and reactive to light.  Neck: Normal range of motion. Neck supple. No tracheal deviation present. No thyromegaly present.  Cardiovascular: Normal rate, regular rhythm and normal heart sounds.   Pulmonary/Chest: Effort normal and breath sounds normal. No respiratory distress. She has no wheezes. She exhibits no tenderness.  Abdominal: Soft. Bowel sounds are normal. She exhibits distension (gaseous). There is tenderness.  Musculoskeletal: Normal range of motion. She exhibits edema.  Neurological: She is alert and oriented to person, place, and time. No cranial nerve deficit.  Skin: Skin is warm and dry. No rash noted.  Psychiatric: Mood and affect normal.  Pallor positive  LABORATORY PANEL:   CBC  Recent Labs Lab 07/09/15 0415  WBC 14.9*  HGB 8.2*  HCT 25.0*  PLT 205    Chemistries   Recent Labs Lab 07/06/15 1311  07/09/15 0415  NA  --   < > 133*  K  --   < > 2.9*  CL  --   < > 91*  CO2  --   < > 33*  GLUCOSE  --   < > 199*  BUN  --   < > 23*  CREATININE  --   < > 0.72  CALCIUM  --   < > 8.1*  MG 2.0  --   --   < > = values in this interval not displayed.  Cardiac Enzymes No results for input(s): TROPONINI in the  last 168 hours. RADIOLOGY:  No results found. ASSESSMENT AND PLAN:  81 yr old W F with multiple medical problmes got out of rehab after having treated with IV abxs for Orbital cellulitis. Presented after a fall at home and ongoing diarrhea.   * C. Diff: Collitis: Diarrhea some improvement. On PO vancomycin, Florostar. GI helping with managing. Still has significant diarrhea and will add flagyl.  * Leucocytosis Likely from C diff And UTI. Improving.  * GIB possible due to hemmoroid:   ASA, heparin SQ, Hb is dropped to 7.3 ( 07/06/15)   Transfuse one unit PRBC (  07/06/15)- Hb came up now.  * UTI  Started on levaquin 07/06/15- Await urine culture  * Oral thrush   On nystatin oral.  * Hypokalemia  due to lasix IV  Replace oral and IV- recheck.  *  Acute on chronic hypervolemic hyponatremia Improving with lasix  * DM. Blood sugar is better, continue Lantus and Continue sliding scale.   * A-fib with RVR.   off carddizem drip, continue ASA,  Continue carvedilol, diltiazem, digoxin. D/C lopressor. Increase dose of cardizem Echo shows normal EF at 60%.  * left elbow pain after fall with abnromal xray. Improved. -appreciate ortho input - no pathology  * Hypertension    continue benazepril, carvedilol, diltiazem, lopressor, clonidine, furosemide, hydralazine  IV hydralazine prn.  * Pleural effusion.  from volume overload and sepsis. continue Lasix  * Acute on chronic diastolic CHF (EF at 60-65%). Decompensated due to infection and IVF. Continue lasix and  Benazepril.  * Ileus. Secondary to severe C. difficile colitis.  Improved per abd xray. Advanced diet.  PT Evaluation suggested skilled nursing facility placement.  CODE STATUS: full  DVT Prophylaxis: heparin  TOTAL TIME TAKING CARE OF THIS PATIENT: 35 minutes.    Milagros Loll R M.D on 07/09/2015 at 12:24 PM  Between 7am to 6pm - Pager - [phone]  After 6pm go to [email or web address] - password [email or web address] [facility]  [place] [facility]  Office  [phone]  CC: Primary care physician; Mila Merry, MD

## 2015-07-09 NOTE — Progress Notes (Signed)
Patient alert and oriented x4, no complaints at this time. vss at this time. Patient afib on telemetry. Will continue to assess. Patient continues to have loose stools. BP elevated this am, Dr. Elpidio Anis notified and new BP med added. Upon recheck, med effective. Trudee Kuster

## 2015-07-09 NOTE — Progress Notes (Signed)
Inpatient Diabetes Program Recommendations  AACE/ADA: New Consensus Statement on Inpatient Glycemic Control (2013)  Target Ranges:  Prepandial:   less than 140 mg/dL      Peak postprandial:   less than 180 mg/dL (1-2 hours)      Critically ill patients:  140 - 180 mg/dL    Results for [name], Katherine [name] (MRN [id number]) as of 07/09/2015 11:00  Ref. Range 07/08/2015 07:35 07/08/2015 10:56 07/08/2015 16:23 07/08/2015 19:43 07/09/2015 07:54  Glucose-Capillary Latest Ref Range: 65-99 mg/dL 782 (H) 956 (H) 213 (H) 281 (H) 196 (H)   Home DM Meds: Lantus 50 units QHS  Januvia 50 mg daily  Glipizide 5 mg daily  Current DM Orders: Lantus 20 units QHS  Novolog Moderate SSI (0-15 units) TID AC + HS   MD- Please consider increasing Lantus to 30 units QHS (home dose is 50 units QHS- patient having hyperglycemia today)  Susette Racer, RN, BA, MHA, CDE Diabetes Coordinator Inpatient Diabetes Program  [phone] (Team Pager) [phone] [facility] Office) 07/09/2015 11:01 AM

## 2015-07-10 LAB — GLUCOSE, CAPILLARY
GLUCOSE-CAPILLARY: 124 mg/dL — AB (ref 65–99)
GLUCOSE-CAPILLARY: 212 mg/dL — AB (ref 65–99)
GLUCOSE-CAPILLARY: 231 mg/dL — AB (ref 65–99)
Glucose-Capillary: 134 mg/dL — ABNORMAL HIGH (ref 65–99)

## 2015-07-10 LAB — COMPREHENSIVE METABOLIC PANEL
ALBUMIN: 3.4 g/dL — AB (ref 3.5–5.0)
ALK PHOS: 71 U/L (ref 38–126)
ALT: 23 U/L (ref 14–54)
ANION GAP: 7 (ref 5–15)
AST: 26 U/L (ref 15–41)
BILIRUBIN TOTAL: 0.7 mg/dL (ref 0.3–1.2)
BUN: 23 mg/dL — AB (ref 6–20)
CO2: 34 mmol/L — ABNORMAL HIGH (ref 22–32)
CREATININE: 0.74 mg/dL (ref 0.44–1.00)
Calcium: 8.4 mg/dL — ABNORMAL LOW (ref 8.9–10.3)
Chloride: 96 mmol/L — ABNORMAL LOW (ref 101–111)
GFR calc non Af Amer: >60 mL/min (ref >60)
GLUCOSE: 79 mg/dL (ref 65–99)
POTASSIUM: 3.1 mmol/L — AB (ref 3.5–5.1)
Sodium: 137 mmol/L (ref 135–145)
Total Protein: 5.5 g/dL — ABNORMAL LOW (ref 6.5–8.1)

## 2015-07-10 LAB — CBC
HCT: 24.7 % — ABNORMAL LOW (ref 35.0–47.0)
Hemoglobin: 8.1 g/dL — ABNORMAL LOW (ref 12.0–16.0)
MCH: 28.9 pg (ref 26.0–34.0)
MCHC: 32.7 g/dL (ref 32.0–36.0)
MCV: 88.3 fL (ref 80.0–100.0)
Platelets: 222 10*3/uL (ref 150–440)
RBC: 2.8 MIL/uL — ABNORMAL LOW (ref 3.80–5.20)
RDW: 17.7 % — AB (ref 11.5–14.5)
WBC: 13.6 10*3/uL — ABNORMAL HIGH (ref 3.6–11.0)

## 2015-07-10 MED ORDER — SODIUM CHLORIDE 0.9 % IJ SOLN: 3.0000 mL | INTRAMUSCULAR | Status: DC | PRN

## 2015-07-10 MED ORDER — SODIUM CHLORIDE 0.9 % IJ SOLN
3.0000 mL | Freq: Two times a day (BID) | INTRAMUSCULAR | Status: DC
  Administered 2015-07-11 – 2015-07-13 (×6): 3 mL via INTRAVENOUS

## 2015-07-10 MED ORDER — CLONIDINE HCL 0.1 MG PO TABS
0.1000 mg | ORAL_TABLET | Freq: Three times a day (TID) | ORAL | Status: DC
  Administered 2015-07-10 – 2015-07-13 (×10): 0.1 mg via ORAL
  Filled 2015-07-10 (×10): qty 1

## 2015-07-10 MED ORDER — POTASSIUM CHLORIDE CRYS ER 20 MEQ PO TBCR
40.0000 meq | EXTENDED_RELEASE_TABLET | Freq: Two times a day (BID) | ORAL | Status: DC
  Administered 2015-07-10 – 2015-07-13 (×6): 40 meq via ORAL
  Filled 2015-07-10 (×6): qty 2

## 2015-07-10 NOTE — Evaluation (Signed)
Occupational Therapy Evaluation Patient Details Name: Katherine [name] MRN: [id number] DOB: 02/20/1932 Today's Date: 07/10/2015    History of Present Illness Pt is an 81 year old female who came to [facility] with C-diff and acute renal failure.    Clinical Impression   Pt is an 81 year old female who came to [facility] with C-diff and acute renal failure.  She had a recent hospital stay at [facility] for eye infection and heart problems and discharged to [facility].  She suffered a fall at [facility] and also fall after discharging to home. She was then found to have rapid ventricular response which brought her to the ICU. Just recently transfered back to telemetry.  She had been independent with activities of daily living and functional mobility with assistive devices. She now needs much assist and reports problems holding a spoon and would benefit from Occupational Therapy for ADL/functioal mobility training and upper extremity strength.   Follow Up Recommendations       Equipment Recommendations       Recommendations for Other Services       Precautions / Restrictions Precautions Precautions: Fall Precaution Comments: enteric isolation Restrictions Weight Bearing Restrictions: No      Mobility Bed Mobility                  Transfers                      Balance                                            ADL                                         General ADL Comments: Had been independent with basic ADL. Patient having difficulty with activities of daily living including self feeding. Patient issued a built up handle for spoon and nursing instructed in it's use and in prevention of being sent down to dining and getting lost.     Vision     Perception     Praxis      Pertinent Vitals/Pain       Hand Dominance     Extremity/Trunk Assessment Upper Extremity Assessment Upper  Extremity Assessment:  (over all 3/5, Having difficulty grasping spoon)   Lower Extremity Assessment Lower Extremity Assessment: Defer to PT evaluation       Communication Communication Communication: No difficulties   Cognition Arousal/Alertness: Awake/alert Behavior During Therapy: Anxious Overall Cognitive Status: Within Functional Limits for tasks assessed (Oriented to person, place, month and year)                     General Comments       Exercises       Shoulder Instructions      Home Living Family/patient expects to be discharged to:: Skilled nursing facility Living Arrangements: Spouse/significant other Available Help at Discharge:  (89 year old husband can not assist patient) Type of Home: House Home Access: Stairs to enter [employer] of Steps: 3 Entrance Stairs-Rails: Left Home Layout: Able to live on main level with bedroom/bathroom               Home  Equipment: Dan Humphreys - 2 wheels;Toilet riser          Prior Functioning/Environment Level of Independence: Independent with assistive device(s)        Comments: used rolling walker    OT Diagnosis: Generalized weakness   OT Problem List: Decreased strength;Decreased activity tolerance;Impaired balance (sitting and/or standing);Pain   OT Treatment/Interventions: Self-care/ADL training    OT Goals(Current goals can be found in the care plan section) Acute Rehab OT Goals Patient Stated Goal: I have not heard from my family OT Goal Formulation:  (with nursing) Time For Goal Achievement: 07/24/15 Potential to Achieve Goals: Good  OT Frequency: Min 1X/week   Barriers to D/C:            Co-evaluation              End of Session Equipment Utilized During Treatment:  (spoon gripper)  Activity Tolerance: Patient limited by fatigue;Patient limited by pain Patient left:     Time: 1550-1610 OT Time Calculation (min): 20 min Charges:  OT General Charges $OT Visit: 1  Procedure OT Evaluation $Initial OT Evaluation Tier I: 1 Procedure OT Treatments $Self Care/Home Management : 8-22 mins G-Codes:    Gwyndolyn Kaufman, MS/OTR/L  07/10/2015, 4:42 PM

## 2015-07-10 NOTE — Progress Notes (Signed)
Patient asking to talk to family. RN dialed family's phone number from room phone and patient was able to speak with family members.

## 2015-07-10 NOTE — Progress Notes (Signed)
Dr. Elpidio Anis notified of new discoloration noted on patients L 2nd toe.

## 2015-07-10 NOTE — Clinical Social Work Note (Signed)
Patient not yet ready for discharge to [facility]. CSW has updated [facility]. York Spaniel MSW,LcSW [phone]

## 2015-07-10 NOTE — Progress Notes (Signed)
Subjective:   Patient continues to have complex multiple medical issues. Currently on Lasix via IV infusion. Good response. Still has large amount of swelling although overall it has improved Weight is down to 88 kg (97 kg at the time of admission)   Objective:  Vital signs in last 24 hours:  Temp:  [97.5 F (36.4 C)-98.2 F (36.8 C)] 98.2 F (36.8 C) (08/12 1121) Pulse Rate:  [73-136] 106 (08/12 1121) Resp:  [2-20] 20 (08/12 1121) BP: (144-170)/(78-93) 144/90 mmHg (08/12 1121) SpO2:  [93 %-97 %] 95 % (08/12 1121) Weight:  [88.134 kg (194 lb 4.8 oz)] 88.134 kg (194 lb 4.8 oz) (08/12 0543)  Weight change: -2.449 kg (-5 lb 6.4 oz) Filed Weights   07/08/15 0413 07/09/15 0549 07/10/15 0543  Weight: 90.629 kg (199 lb 12.8 oz) 90.583 kg (199 lb 11.2 oz) 88.134 kg (194 lb 4.8 oz)    Intake/Output: I/O last 3 completed shifts: In: 478.3 [I.V.:278.3; IV Piggyback:200] Out: 2975 [Urine:2975]   Intake/Output this shift:  Total I/O In: 237 [P.O.:237] Out: -   Physical Exam: General: NAD  Head: Normocephalic, atraumatic. Moist oral mucosal membranes  Eyes: Anicteric,  Neck: Supple, trachea midline  Lungs:  Clear to auscultation  Heart: Irregular  Abdomen:  Soft, tender to palpation, +bowel sounds, mildly distended   Extremities: 3+ peripheral and dependent edema in all four extremities  Neurologic: Nonfocal, moving all four extremities  Skin: No acute lesions    Foley present     Basic Metabolic Panel:  Recent Labs Lab 07/06/15 0352 07/06/15 1311 07/07/15 0414 07/08/15 0435 07/09/15 0415 07/09/15 1439 07/10/15 0433  NA 132*  --  133* 134* 133*  --  137  K 2.6*  --  3.6 3.0* 2.9* 3.7 3.1*  CL 93*  --  91* 92* 91*  --  96*  CO2 31  --  32 31 33*  --  34*  GLUCOSE 99  --  213* 125* 199*  --  79  BUN 25*  --  23* 23* 23*  --  23*  CREATININE 0.64  --  0.75 0.82 0.72  --  0.74  CALCIUM 7.9*  --  8.2* 8.3* 8.1*  --  8.4*  MG  --  2.0  --   --   --  1.9  --      Liver Function Tests:  Recent Labs Lab 07/10/15 0433  AST 26  ALT 23  ALKPHOS 71  BILITOT 0.7  PROT 5.5*  ALBUMIN 3.4*   No results for input(s): LIPASE, AMYLASE in the last 168 hours. No results for input(s): AMMONIA in the last 168 hours.  CBC:  Recent Labs Lab 07/06/15 0352 07/07/15 0414 07/08/15 0518 07/09/15 0415 07/10/15 0433  WBC 19.4* 14.0* 25.9* 14.9* 13.6*  HGB 7.3* 8.8* 8.7* 8.2* 8.1*  HCT 21.6* 26.4* 26.5* 25.0* 24.7*  MCV 85.9 87.1 87.0 88.4 88.3  PLT 155 171 204 205 222    Cardiac Enzymes: No results for input(s): CKTOTAL, CKMB, CKMBINDEX, TROPONINI in the last 168 hours.  BNP: Invalid input(s): POCBNP  CBG:  Recent Labs Lab 07/09/15 1150 07/09/15 1646 07/09/15 2103 07/10/15 0728 07/10/15 1120  GLUCAP 217* 220* 227* 124* 212*    Microbiology: Results for orders placed or performed during the hospital encounter of 06/21/15  Stool culture     Status: None   Collection Time: 06/21/15  5:17 PM  Result Value Ref Range Status   Specimen Description STOOL  Final   Special Requests  Normal  Final   Culture   Final    NO SALMONELLA OR SHIGELLA ISOLATED No Pathogenic E. coli detected NO CAMPYLOBACTER DETECTED    Report Status 06/23/2015 FINAL  Final  C difficile quick scan w PCR reflex ([facility] only)     Status: Abnormal   Collection Time: 06/21/15  5:17 PM  Result Value Ref Range Status   C Diff antigen POSITIVE (A) NEGATIVE Final   C Diff toxin POSITIVE (A) NEGATIVE Final   C Diff interpretation   Final    Positive for toxigenic C. difficile, active toxin production present.    Comment: CRITICAL RESULT CALLED TO, READ BACK BY AND VERIFIED WITH: DONALD SWEENEY ON 06/21/15 AT 1755 BY JEF   MRSA PCR Screening     Status: None   Collection Time: 06/25/15 10:48 AM  Result Value Ref Range Status   MRSA by PCR NEGATIVE NEGATIVE Final    Comment:        The GeneXpert MRSA Assay (FDA approved for NASAL specimens only), is one component of  a comprehensive MRSA colonization surveillance program. It is not intended to diagnose MRSA infection nor to guide or monitor treatment for MRSA infections.   Urine culture     Status: None   Collection Time: 07/06/15 11:38 AM  Result Value Ref Range Status   Specimen Description URINE, RANDOM  Final   Special Requests NONE  Final   Culture 30,000 COLONIES/mL PROTEUS MIRABILIS  Final   Report Status 07/08/2015 FINAL  Final   Organism ID, Bacteria PROTEUS MIRABILIS  Final      Susceptibility   Proteus mirabilis - MIC*    AMPICILLIN <=2 SENSITIVE Sensitive     CEFTAZIDIME <=1 SENSITIVE Sensitive     CEFAZOLIN <=4 SENSITIVE Sensitive     CEFTRIAXONE <=1 SENSITIVE Sensitive     CIPROFLOXACIN <=0.25 SENSITIVE Sensitive     GENTAMICIN <=1 SENSITIVE Sensitive     IMIPENEM 2 SENSITIVE Sensitive     TRIMETH/SULFA <=20 SENSITIVE Sensitive     NITROFURANTOIN Value in next row Resistant      RESISTANT128    PIP/TAZO Value in next row Sensitive      SENSITIVE<=4    AMPICILLIN/SULBACTAM Value in next row Sensitive      SENSITIVE<=2    * 30,000 COLONIES/mL PROTEUS MIRABILIS  Culture, blood (routine x 2)     Status: None (Preliminary result)   Collection Time: 07/08/15  8:27 AM  Result Value Ref Range Status   Specimen Description BLOOD RIGHT HAND  Final   Special Requests BOTTLES DRAWN AEROBIC AND ANAEROBIC 10CC  Final   Culture NO GROWTH 2 DAYS  Final   Report Status PENDING  Incomplete  Culture, blood (routine x 2)     Status: None (Preliminary result)   Collection Time: 07/08/15  8:34 AM  Result Value Ref Range Status   Specimen Description BLOOD RIGHT HAND  Final   Special Requests BOTTLES DRAWN AEROBIC AND ANAEROBIC 7CC  Final   Culture NO GROWTH 2 DAYS  Final   Report Status PENDING  Incomplete    Coagulation Studies: No results for input(s): LABPROT, INR in the last 72 hours.  Urinalysis: No results for input(s): COLORURINE, LABSPEC, PHURINE, GLUCOSEU, HGBUR, BILIRUBINUR,  KETONESUR, PROTEINUR, UROBILINOGEN, NITRITE, LEUKOCYTESUR in the last 72 hours.  Invalid input(s): APPERANCEUR    Imaging: No results found.   Medications:   . furosemide (LASIX) infusion 8 mg/hr (07/09/15 1900)   . acidophilus  1 capsule Oral BID  .  albumin human  12.5 g Intravenous BID  . antiseptic oral rinse  7 mL Mouth Rinse BID  . aspirin  81 mg Oral Daily  . atorvastatin  80 mg Oral QPM  . benazepril  40 mg Oral BID  . carvedilol  25 mg Oral BID  . cloNIDine  0.1 mg Oral TID  . digoxin  0.25 mg Oral Daily  . diltiazem  360 mg Oral Daily  . dorzolamide  1 drop Both Eyes TID  . enoxaparin (LOVENOX) injection  40 mg Subcutaneous Q24H  . famotidine  20 mg Oral Q supper  . feeding supplement (GLUCERNA SHAKE)  237 mL Oral QID  . hydrocortisone  25 mg Rectal BID  . hydrocortisone cream   Topical QID  . insulin aspart  0-15 Units Subcutaneous TID WC  . insulin aspart  0-5 Units Subcutaneous QHS  . insulin glargine  26 Units Subcutaneous QHS  . latanoprost  1 drop Both Eyes QPM  . metroNIDAZOLE  500 mg Oral 3 times per day  . multivitamin with minerals  1 tablet Oral q morning - 10a  . nystatin  5 mL Oral QID  . nystatin   Topical BID  . potassium chloride  40 mEq Oral BID  . saccharomyces boulardii  250 mg Oral BID  . sertraline  50 mg Oral Daily  . spironolactone  25 mg Oral Daily  . timolol  1 drop Both Eyes BID  . vancomycin  250 mg Oral 4 times per day   albuterol, alum & mag hydroxide-simeth, diphenhydrAMINE, enalaprilat, labetalol, liver oil-zinc oxide, morphine injection, ondansetron (ZOFRAN) IV, phenylephrine-shark liver oil-mineral oil-petrolatum, simethicone, traMADol, zolpidem  Assessment/ Plan:  Katherine [name] is a 81 y.o. [place] female with diabetes mellitus type II, hypertension, congestive heart failure, hyperlipidemia, coronary artery disease, atrial fibrillation, GERD, mitral valve disorder who was admitted to [facility] on 06/21/2015 for C. Diff colitis.  Patient was found to have a serum sodium of 122. Baseline of 132 (05/03/2015).   1.  Generalized Edema: with low albumin. Recent echo shows normal LVEF. Abdominal imaging did not show liver cirrhosis. - continue furosemide IV infusion. - albumin supplementation IV twice a day - wt 88 kg  2. Hyponatremia - improved  3. Hypokalemia: due to IV furosemide. May contributing to GI distension.  - potassium chloride  - Continue spironolactone for added diuresis and hopefully will help with potassium conservation  4. Acute kidney injury : creatinine now back to baseline.  - monitor volume status, urine output and renal function   5. Hypertension with atrial fibrillation with RVR:  - multiple meds, As Volume status improves, blood pressure expected to go lower, therefore lower doses of antihypertensives will be needed - Hydralazine discontinued     LOS: 19 Katherine [name] 8/12/20161:20 PM

## 2015-07-10 NOTE — Progress Notes (Signed)
[employer] delivered bed and patient has been transferred to new air mattress and frame with rotating every 20 minutes.

## 2015-07-10 NOTE — Progress Notes (Addendum)
Contacted [employer] for bed for patient. Will be contacted by delivery for ETA.  9:04 AM contacted with ETA 

## 2015-07-10 NOTE — Progress Notes (Signed)
[facility] - [place] at [facility]   PATIENT NAME: Katherine [name]    MR#:  [id number]  DATE OF BIRTH:  [birth year]  SUBJECTIVE:   Admitted for c diff. Still weak, poor appetite. Diarrhea with some improvement. On lasix drip with albumin. Afebrile. No abd pain.  REVIEW OF SYSTEMS:   Review of Systems  Constitutional: Negative for fever, chills and weight loss.  HENT: Negative for ear discharge, ear pain and nosebleeds.   Eyes: Negative for blurred vision, pain and discharge.  Respiratory: Negative for sputum production, shortness of breath, wheezing and stridor.   Cardiovascular: Positive for leg swelling. Negative for chest pain, palpitations, orthopnea and PND.  Gastrointestinal: Positive for diarrhea. Negative for vomiting and blood in stool.  Genitourinary: Negative for urgency and frequency.  Musculoskeletal: Negative for back pain and joint pain.  Neurological: Positive for weakness. Negative for sensory change, speech change and focal weakness.  Psychiatric/Behavioral: Negative for depression. The patient is not nervous/anxious.   All other systems reviewed and are negative.   DRUG ALLERGIES:   Allergies  Allergen Reactions  . Benzocaine Other (See Comments)    Unknown reaction  . Contrast Media [Iodinated Diagnostic Agents] Other (See Comments)    Tachycardia, SVT  . Other Other (See Comments)    MRI dye--reaction unknown.  Marland Kitchen Penicillin V Potassium Other (See Comments)  . Penicillins Other (See Comments)    Reaction: pt doesn't know, b/c it's been a long time ago.   . Sulfa Antibiotics Other (See Comments)    Reaction: pt doesn't know, b/c it's been a long time ago.    VITALS:  Blood pressure 144/90, pulse 106, temperature 98.2 F (36.8 C), temperature source Oral, resp. rate 20, height 5\' 3"  (1.6 m), weight 88.134 kg (194 lb 4.8 oz), SpO2 95 %. PHYSICAL EXAMINATION:  Physical Exam  Constitutional: She is oriented to person, place,  and time and well-developed, well-nourished, and in no distress.  HENT:  Head: Normocephalic and atraumatic.  Eyes: Conjunctivae and EOM are normal. Pupils are equal, round, and reactive to light.  Neck: Normal range of motion. Neck supple. No tracheal deviation present. No thyromegaly present.  Cardiovascular: Normal rate, regular rhythm and normal heart sounds.   Pulmonary/Chest: Effort normal and breath sounds normal. No respiratory distress. She has no wheezes. She exhibits no tenderness.  Abdominal: Soft. Bowel sounds are normal. She exhibits distension (gaseous). There is tenderness.  Musculoskeletal: Normal range of motion. She exhibits edema.  Neurological: She is alert and oriented to person, place, and time. No cranial nerve deficit.  Skin: Skin is warm and dry. No rash noted.  Psychiatric: Mood and affect normal.  Pallor positive  LABORATORY PANEL:   CBC  Recent Labs Lab 07/10/15 0433  WBC 13.6*  HGB 8.1*  HCT 24.7*  PLT 222    Chemistries   Recent Labs Lab 07/09/15 1439 07/10/15 0433  NA  --  137  K 3.7 3.1*  CL  --  96*  CO2  --  34*  GLUCOSE  --  79  BUN  --  23*  CREATININE  --  0.74  CALCIUM  --  8.4*  MG 1.9  --   AST  --  26  ALT  --  23  ALKPHOS  --  71  BILITOT  --  0.7    Cardiac Enzymes No results for input(s): TROPONINI in the last 168 hours. RADIOLOGY:  No results found. ASSESSMENT AND PLAN:  81 yr old  W F with multiple medical problmes got out of rehab after having treated with IV abxs for Orbital cellulitis. Presented after a fall at home and ongoing diarrhea.   * C. Diff: Collitis: Diarrhea some improvement. On PO vancomycin, Florostar. GI helping with managing. Added flagyl 07/09/2015.  * Leucocytosis Likely from C diff And UTI. Improving. Abx stopped. Finished 5 days  * GIB possible due to hemmoroid:   ASA, heparin SQ, Hb is dropped to 7.3 ( 07/06/15)   Transfuse one unit PRBC ( 07/06/15)- Hb came up now.  * UTI  Started  on levaquin 07/06/15- Await urine culture  * Oral thrush   On nystatin oral.  * Hypokalemia  due to lasix IV Increase daily Kcl to BID.  *  Acute on chronic hypervolemic hyponatremia Improving with lasix  * DM. Blood sugar is better, continue Lantus and Continue sliding scale.   * A-fib with RVR.   off carddizem drip, continue ASA,  Continue carvedilol, diltiazem, digoxin. D/C lopressor. Increase dose of cardizem Echo shows normal EF at 60%.  * left elbow pain after fall with abnromal xray. Improved. -appreciate ortho input - no pathology  * Hypertension    continue benazepril, carvedilol, diltiazem, lopressor, clonidine, furosemide, hydralazine  IV hydralazine prn.  * Pleural effusion.  from volume overload and sepsis. continue Lasix  * Acute on chronic diastolic CHF (EF at 60-65%). Decompensated due to infection and IVF. Continue lasix and  Benazepril.  * Ileus. Secondary to severe C. difficile colitis.  Improved per abd xray. Advanced diet.  SNF at discharge  CODE STATUS: full  DVT Prophylaxis: heparin  TOTAL TIME TAKING CARE OF THIS PATIENT: 35 minutes.    Milagros Loll R M.D on 07/10/2015 at 11:49 AM  Between 7am to 6pm - Pager - [phone]  After 6pm go to [email or web address] - password [email or web address]  [place] [facility]  Office  [phone]  CC: Primary care physician; Mila Merry, MD

## 2015-07-10 NOTE — Consult Note (Signed)
Pt with 3 somewhat mushy/loose stools today per nurse.  Pt denies abd pain.  Eating some and drinking some.  No further recommendations at this time.  Continue vancomycin as ordered.  Continue it for 10 days after all other antibiotics have finished their course.

## 2015-07-10 NOTE — Progress Notes (Signed)
Nutrition Follow-up     INTERVENTION:   Medical Food Supplement Therapy: continue Glucerna QID Meals and Snacks: Cater to patient preferences Feeding Assistance: continue meal assistance as needed Coordination of Care: discussed recommendation of nutrition support previously with attending; MD did not want nutrition support at that time.  NUTRITION DIAGNOSIS:   Inadequate oral intake related to inability to eat, altered GI function, acute illness as evidenced by NPO status.   GOAL:   Patient will meet greater than or equal to 90% of their needs   MONITOR:    (Energy Intake: diet progrsesion, Digestive System, Electrolyte/Renal Profile, Glucose Profile)  REASON FOR ASSESSMENT:   Consult Assessment of nutrition requirement/status  ASSESSMENT:    Pt continues on lasix drip, pt preoccupied by discharge plan on visit today, pt wanting to make sure family is aware of her discharge plan (discussed with Maddie RN). Per [place] CSW note, pt not yet ready for discharge  Diet Order:  Diet regular Room service appropriate?: Yes; Fluid consistency:: Thin   Energy Intake: per Maddie RN, pt took some broth/soup and drank 100% of Glucerna shake as well water. Pt meal tray by sink on visit today and was untouched. Pt reported that she did not know it was there. Offered to order new meal tray for pt, pt declined at this time (again pt only concerned about her discharge plan)  Electrolyte and Renal Profile:  Recent Labs Lab 07/06/15 1311  07/08/15 0435 07/09/15 0415 07/09/15 1439 07/10/15 0433  BUN  --   < > 23* 23*  --  23*  CREATININE  --   < > 0.82 0.72  --  0.74  NA  --   < > 134* 133*  --  137  K  --   < > 3.0* 2.9* 3.7 3.1*  MG 2.0  --   --   --  1.9  --   < > = values in this interval not displayed.  Glucose Profile:  Recent Labs  07/09/15 2103 07/10/15 0728 07/10/15 1120  GLUCAP 227* 124* 212*    Skin:  Reviewed, no issues  Last BM:  Cdiff positive, diarrhea  improving  Height:   Ht Readings from Last 1 Encounters:  06/21/15  (1.6 m)   Weight:   Wt Readings from Last 1 Encounters:  07/10/15 194 lb 4.8 oz (88.134 kg)   Filed Weights   07/08/15 0413 07/09/15 0549 07/10/15 0543  Weight: 199 lb 12.8 oz (90.629 kg) 199 lb 11.2 oz (90.583 kg) 194 lb 4.8 oz (88.134 kg)    BMI:  Body mass index is 34.43 kg/(m^2).  Estimated Nutritional Needs:   Kcal:  1744-1889 kcals (BEE 1118, 1.3 AF, 1.2-1.3 IF) using IBW 52.3 kg  Protein:  62-73 g (1.2-1.4 g/kg)   Fluid:  1560-1820 mL (30-35 ml/kg)   MODERATE Care Level  Romelle Starcher MS, RD, LDN [phone] Pager

## 2015-07-11 LAB — GLUCOSE, CAPILLARY
Glucose-Capillary: 158 mg/dL — ABNORMAL HIGH (ref 65–99)
Glucose-Capillary: 258 mg/dL — ABNORMAL HIGH (ref 65–99)
Glucose-Capillary: 219 mg/dL — ABNORMAL HIGH (ref 65–99)

## 2015-07-11 NOTE — Progress Notes (Signed)
Subjective:   Patient laying in bed. Concerned because she has no family that is able to care for her.   Objective:  Vital signs in last 24 hours:  Temp:  [98.1 F (36.7 C)-99.1 F (37.3 C)] 98.1 F (36.7 C) (08/13 1247) Pulse Rate:  [68-91] 68 (08/13 1247) Resp:  [18-22] 20 (08/13 1247) BP: (148-156)/(65-80) 156/65 mmHg (08/13 1247) SpO2:  [95 %-96 %] 96 % (08/13 1247) Weight:  [91.173 kg (201 lb)] 91.173 kg (201 lb) (08/13 0545)  Weight change: 3.039 kg (6 lb 11.2 oz) Filed Weights   07/09/15 0549 07/10/15 0543 07/11/15 0545  Weight: 90.583 kg (199 lb 11.2 oz) 88.134 kg (194 lb 4.8 oz) 91.173 kg (201 lb)    Intake/Output: I/O last 3 completed shifts: In: 599.7 [P.O.:237; I.V.:262.7; IV Piggyback:100] Out: 2600 [Urine:2600]   Intake/Output this shift:  Total I/O In: 600 [P.O.:600] Out: 450 [Urine:450]  Physical Exam: General: NAD  Head: Normocephalic, atraumatic. Moist oral mucosal membranes  Eyes: Anicteric,  Neck: Supple, trachea midline  Lungs:  Clear to auscultation  Heart: Irregular  Abdomen:  Soft, tender to palpation, +bowel sounds, mildly distended   Extremities: 1+ peripheral and dependent edema in all four extremities  Neurologic: Nonfocal, moving all four extremities  Skin: No acute lesions  GU  Foley present     Basic Metabolic Panel:  Recent Labs Lab 07/06/15 0352 07/06/15 1311 07/07/15 0414 07/08/15 0435 07/09/15 0415 07/09/15 1439 07/10/15 0433  NA 132*  --  133* 134* 133*  --  137  K 2.6*  --  3.6 3.0* 2.9* 3.7 3.1*  CL 93*  --  91* 92* 91*  --  96*  CO2 31  --  32 31 33*  --  34*  GLUCOSE 99  --  213* 125* 199*  --  79  BUN 25*  --  23* 23* 23*  --  23*  CREATININE 0.64  --  0.75 0.82 0.72  --  0.74  CALCIUM 7.9*  --  8.2* 8.3* 8.1*  --  8.4*  MG  --  2.0  --   --   --  1.9  --     Liver Function Tests:  Recent Labs Lab 07/10/15 0433  AST 26  ALT 23  ALKPHOS 71  BILITOT 0.7  PROT 5.5*  ALBUMIN 3.4*   No results for  input(s): LIPASE, AMYLASE in the last 168 hours. No results for input(s): AMMONIA in the last 168 hours.  CBC:  Recent Labs Lab 07/06/15 0352 07/07/15 0414 07/08/15 0518 07/09/15 0415 07/10/15 0433  WBC 19.4* 14.0* 25.9* 14.9* 13.6*  HGB 7.3* 8.8* 8.7* 8.2* 8.1*  HCT 21.6* 26.4* 26.5* 25.0* 24.7*  MCV 85.9 87.1 87.0 88.4 88.3  PLT 155 171 204 205 222    Cardiac Enzymes: No results for input(s): CKTOTAL, CKMB, CKMBINDEX, TROPONINI in the last 168 hours.  BNP: Invalid input(s): POCBNP  CBG:  Recent Labs Lab 07/10/15 1120 07/10/15 1602 07/10/15 2013 07/11/15 0739 07/11/15 1249  GLUCAP 212* 134* 231* 158* 258*    Microbiology: Results for orders placed or performed during the hospital encounter of 06/21/15  Stool culture     Status: None   Collection Time: 06/21/15  5:17 PM  Result Value Ref Range Status   Specimen Description STOOL  Final   Special Requests Normal  Final   Culture   Final    NO SALMONELLA OR SHIGELLA ISOLATED No Pathogenic E. coli detected NO CAMPYLOBACTER DETECTED  Report Status 06/23/2015 FINAL  Final  C difficile quick scan w PCR reflex ([facility] only)     Status: Abnormal   Collection Time: 06/21/15  5:17 PM  Result Value Ref Range Status   C Diff antigen POSITIVE (A) NEGATIVE Final   C Diff toxin POSITIVE (A) NEGATIVE Final   C Diff interpretation   Final    Positive for toxigenic C. difficile, active toxin production present.    Comment: CRITICAL RESULT CALLED TO, READ BACK BY AND VERIFIED WITH: DONALD SWEENEY ON 06/21/15 AT 1755 BY JEF   MRSA PCR Screening     Status: None   Collection Time: 06/25/15 10:48 AM  Result Value Ref Range Status   MRSA by PCR NEGATIVE NEGATIVE Final    Comment:        The GeneXpert MRSA Assay (FDA approved for NASAL specimens only), is one component of a comprehensive MRSA colonization surveillance program. It is not intended to diagnose MRSA infection nor to guide or monitor treatment for MRSA  infections.   Urine culture     Status: None   Collection Time: 07/06/15 11:38 AM  Result Value Ref Range Status   Specimen Description URINE, RANDOM  Final   Special Requests NONE  Final   Culture 30,000 COLONIES/mL PROTEUS MIRABILIS  Final   Report Status 07/08/2015 FINAL  Final   Organism ID, Bacteria PROTEUS MIRABILIS  Final      Susceptibility   Proteus mirabilis - MIC*    AMPICILLIN <=2 SENSITIVE Sensitive     CEFTAZIDIME <=1 SENSITIVE Sensitive     CEFAZOLIN <=4 SENSITIVE Sensitive     CEFTRIAXONE <=1 SENSITIVE Sensitive     CIPROFLOXACIN <=0.25 SENSITIVE Sensitive     GENTAMICIN <=1 SENSITIVE Sensitive     IMIPENEM 2 SENSITIVE Sensitive     TRIMETH/SULFA <=20 SENSITIVE Sensitive     NITROFURANTOIN Value in next row Resistant      RESISTANT128    PIP/TAZO Value in next row Sensitive      SENSITIVE<=4    AMPICILLIN/SULBACTAM Value in next row Sensitive      SENSITIVE<=2    * 30,000 COLONIES/mL PROTEUS MIRABILIS  Culture, blood (routine x 2)     Status: None (Preliminary result)   Collection Time: 07/08/15  8:27 AM  Result Value Ref Range Status   Specimen Description BLOOD RIGHT HAND  Final   Special Requests BOTTLES DRAWN AEROBIC AND ANAEROBIC 10CC  Final   Culture NO GROWTH 2 DAYS  Final   Report Status PENDING  Incomplete  Culture, blood (routine x 2)     Status: None (Preliminary result)   Collection Time: 07/08/15  8:34 AM  Result Value Ref Range Status   Specimen Description BLOOD RIGHT HAND  Final   Special Requests BOTTLES DRAWN AEROBIC AND ANAEROBIC 7CC  Final   Culture NO GROWTH 2 DAYS  Final   Report Status PENDING  Incomplete    Coagulation Studies: No results for input(s): LABPROT, INR in the last 72 hours.  Urinalysis: No results for input(s): COLORURINE, LABSPEC, PHURINE, GLUCOSEU, HGBUR, BILIRUBINUR, KETONESUR, PROTEINUR, UROBILINOGEN, NITRITE, LEUKOCYTESUR in the last 72 hours.  Invalid input(s): APPERANCEUR    Imaging: No results  found.   Medications:   . furosemide (LASIX) infusion 8 mg/hr (07/10/15 1922)   . acidophilus  1 capsule Oral BID  . albumin human  12.5 g Intravenous BID  . antiseptic oral rinse  7 mL Mouth Rinse BID  . aspirin  81 mg Oral Daily  .  atorvastatin  80 mg Oral QPM  . benazepril  40 mg Oral BID  . carvedilol  25 mg Oral BID  . cloNIDine  0.1 mg Oral TID  . digoxin  0.25 mg Oral Daily  . diltiazem  360 mg Oral Daily  . dorzolamide  1 drop Both Eyes TID  . enoxaparin (LOVENOX) injection  40 mg Subcutaneous Q24H  . famotidine  20 mg Oral Q supper  . feeding supplement (GLUCERNA SHAKE)  237 mL Oral QID  . hydrocortisone  25 mg Rectal BID  . hydrocortisone cream   Topical QID  . insulin aspart  0-15 Units Subcutaneous TID WC  . insulin aspart  0-5 Units Subcutaneous QHS  . insulin glargine  26 Units Subcutaneous QHS  . latanoprost  1 drop Both Eyes QPM  . metroNIDAZOLE  500 mg Oral 3 times per day  . multivitamin with minerals  1 tablet Oral q morning - 10a  . nystatin  5 mL Oral QID  . nystatin   Topical BID  . potassium chloride  40 mEq Oral BID  . saccharomyces boulardii  250 mg Oral BID  . sertraline  50 mg Oral Daily  . sodium chloride  3 mL Intravenous Q12H  . spironolactone  25 mg Oral Daily  . timolol  1 drop Both Eyes BID  . vancomycin  250 mg Oral 4 times per day   albuterol, alum & mag hydroxide-simeth, diphenhydrAMINE, enalaprilat, labetalol, morphine injection, ondansetron (ZOFRAN) IV, phenylephrine-shark liver oil-mineral oil-petrolatum, simethicone, sodium chloride, traMADol, zolpidem  Assessment/ Plan:  [name] is a 81 y.o. [place] female with diabetes mellitus type II, hypertension, congestive heart failure, hyperlipidemia, coronary artery disease, atrial fibrillation, GERD, mitral valve disorder who was admitted to [facility] on 06/21/2015 for C. Diff colitis. Patient was found to have a serum sodium of 122. Baseline of 132 (05/03/2015).   1.  Generalized  Edema: with low albumin. Recent echo shows normal LVEF. Abdominal imaging did not show liver cirrhosis. - continue furosemide IV infusion for another day.  - albumin supplementation IV twice a day - monitor weight and urine output.   2. Hyponatremia: secondary to hypervolemia.  - improved with diuresis  3. Hypokalemia: due to IV furosemide.  - potassium chloride: dose increased  - Continue spironolactone for added diuresis and potassium conservation  4. Acute kidney injury : creatinine now back to baseline.  - monitor volume status, urine output and renal function   5. Hypertension with atrial fibrillation with RVR: still with some blood pressure elevations.  - benazepril, clonidine, carvedilol, diltiazem, and furosemide and spironolactone as above.       LOS: 20 Catlin Aycock 8/13/201612:57 PM

## 2015-07-11 NOTE — Plan of Care (Signed)
Problem: Phase III Progression Outcomes Goal: Tolerating diet Outcome: Not Progressing Poor oral intake, patients needs encouragement and assistance with meals.

## 2015-07-11 NOTE — Consult Note (Signed)
Pt with still 3-5 stools a day somewhat loose.  Abd mild distended not tender, no HSM.  Chest clear on right, decreased sounds some on left. WBC down to 13.6, K 3.1, hgb 8.1, VSS afebrile.  Pt probably reached baseline and I will sign off at this time.  Reconsult if needed.  Continue anti C. Diff treatment in nursing home for 10-14 days after all other antibiotics are finished.

## 2015-07-11 NOTE — Care Management Note (Signed)
Case Management Note  Patient Details  Name: SADAF PRZYBYSZ MRN: [id number] Date of Birth: 1932/07/01  Subjective/Objective:   Ms Stepien will have been hospitalized at [facility] 21 days tomorrow 07/12/15. Called and left a voice message with Corinne at [facility] to please advise whether or not Ms Kempner is being considered for admission to [facility]. Ms [employer], [facility], requires a 21 day admission before seeking treatment at another facility.                 Action/Plan:   Expected Discharge Date:                  Expected Discharge Plan:     In-House Referral:     Discharge planning Services     Post Acute Care Choice:    Choice offered to:     DME Arranged:    DME Agency:     HH Arranged:    HH Agency:     Status of Service:     Medicare Important Message Given:  Other (see comment) (Notification given) Date Medicare IM Given:    Medicare IM give by:    Date Additional Medicare IM Given:    Additional Medicare Important Message give by:     If discussed at Long Length of Stay Meetings, dates discussed:    Additional Comments:  Zamariyah Furukawa A, RN 07/11/2015, 5:03 PM

## 2015-07-11 NOTE — Progress Notes (Signed)
[facility] - [place] at [facility]   PATIENT NAME: Katherine [name]    MR#:  [id number]  DATE OF BIRTH:  [birth year]  SUBJECTIVE:   Admitted for c diff. Still weak, poor appetite.  Diarrhea slowly improving. Very tired and wants to sleep all day.  On alsix drip.  Needs SNF at discharge.  REVIEW OF SYSTEMS:   Review of Systems  Constitutional: Negative for fever, chills and weight loss.  HENT: Negative for ear discharge, ear pain and nosebleeds.   Eyes: Negative for blurred vision, pain and discharge.  Respiratory: Negative for sputum production, shortness of breath, wheezing and stridor.   Cardiovascular: Positive for leg swelling. Negative for chest pain, palpitations, orthopnea and PND.  Gastrointestinal: Positive for diarrhea. Negative for vomiting and blood in stool.  Genitourinary: Negative for urgency and frequency.  Musculoskeletal: Negative for back pain and joint pain.  Neurological: Positive for weakness. Negative for sensory change, speech change and focal weakness.  Psychiatric/Behavioral: Negative for depression. The patient is not nervous/anxious.   All other systems reviewed and are negative.   DRUG ALLERGIES:   Allergies  Allergen Reactions  . Benzocaine Other (See Comments)    Unknown reaction  . Contrast Media [Iodinated Diagnostic Agents] Other (See Comments)    Tachycardia, SVT  . Other Other (See Comments)    MRI dye--reaction unknown.  Marland Kitchen Penicillin V Potassium Other (See Comments)  . Penicillins Other (See Comments)    Reaction: pt doesn't know, b/c it's been a long time ago.   . Sulfa Antibiotics Other (See Comments)    Reaction: pt doesn't know, b/c it's been a long time ago.    VITALS:  Blood pressure 148/80, pulse 91, temperature 98.2 F (36.8 C), temperature source Oral, resp. rate 22, height 5\' 3"  (1.6 m), weight 91.173 kg (201 lb), SpO2 95 %. PHYSICAL EXAMINATION:  Physical Exam  Constitutional: She is  oriented to person, place, and time and well-developed, well-nourished, and in no distress.  HENT:  Head: Normocephalic and atraumatic.  Eyes: Conjunctivae and EOM are normal. Pupils are equal, round, and reactive to light.  Neck: Normal range of motion. Neck supple. No tracheal deviation present. No thyromegaly present.  Cardiovascular: Normal rate, regular rhythm and normal heart sounds.   Pulmonary/Chest: Effort normal and breath sounds normal. No respiratory distress. She has no wheezes. She exhibits no tenderness.  Abdominal: Soft. Bowel sounds are normal. She exhibits distension (gaseous). There is tenderness.  Musculoskeletal: Normal range of motion. She exhibits edema.  Neurological: She is alert and oriented to person, place, and time. No cranial nerve deficit.  Skin: Skin is warm and dry. No rash noted.  Psychiatric: Mood and affect normal.  Pallor positive  LABORATORY PANEL:   CBC  Recent Labs Lab 07/10/15 0433  WBC 13.6*  HGB 8.1*  HCT 24.7*  PLT 222    Chemistries   Recent Labs Lab 07/09/15 1439 07/10/15 0433  NA  --  137  K 3.7 3.1*  CL  --  96*  CO2  --  34*  GLUCOSE  --  79  BUN  --  23*  CREATININE  --  0.74  CALCIUM  --  8.4*  MG 1.9  --   AST  --  26  ALT  --  23  ALKPHOS  --  71  BILITOT  --  0.7    Cardiac Enzymes No results for input(s): TROPONINI in the last 168 hours. RADIOLOGY:  No results found. ASSESSMENT  AND PLAN:  81 yr old W F with multiple medical problmes got out of rehab after having treated with IV abxs for Orbital cellulitis. Presented after a fall at home and ongoing diarrhea.   * C. Diff: Collitis: Diarrhea some improvement. On PO vancomycin, Florostar. GI helping with managing. Added flagyl 07/09/2015.  * Leucocytosis Likely from C diff And UTI. Improving. Ceftriaxone for UTI stopped 07/10/2015. Finished 5 days  * GIB possible due to hemmoroid:   ASA, heparin SQ, Hb is dropped to 7.3 ( 07/06/15)   Transfused one  unit PRBC ( 07/06/15)- Hb came up now.  * UTI, proteus Finished Abx  * Oral thrush   On nystatin oral.  * Hypokalemia  due to lasix IV Increase daily Kcl to BID. Monitor  *  Acute on chronic hypervolemic hyponatremia Improving with lasix  * DM. Blood sugar is better, continue Lantus and Continue sliding scale.   * A-fib with RVR.   off carddizem drip, continue ASA,  Continue carvedilol, diltiazem, digoxin. D/C lopressor. Increased dose of cardizem Echo shows normal EF at 60%.  * left elbow pain after fall with abnromal xray. Improved. -appreciate ortho input - no pathology  * Hypertension    continue benazepril, carvedilol, diltiazem, lopressor, clonidine, furosemide, hydralazine  IV hydralazine prn.  * Pleural effusion.  from volume overload and sepsis. continue Lasix  * Acute on chronic diastolic CHF (EF at 60-65%). Decompensated due to infection and IVF. Continue lasix and  Benazepril.  * Ileus. Secondary to severe C. difficile colitis.  Improved per abd xray. Advanced diet.  SNF ON MONDAY  CODE STATUS: full  DVT Prophylaxis: Lovenox  TOTAL TIME TAKING CARE OF THIS PATIENT: 35 minutes.    Milagros Loll R M.D on 07/11/2015 at 10:25 AM  Between 7am to 6pm - Pager - [phone]  After 6pm go to [email or web address] - password [email or web address] [facility]  [place] [facility]  Office  [phone]  CC: Primary care physician; Mila Merry, MD

## 2015-07-12 LAB — CBC
HCT: 25.8 % — ABNORMAL LOW (ref 35.0–47.0)
Hemoglobin: 8.5 g/dL — ABNORMAL LOW (ref 12.0–16.0)
MCH: 29.5 pg (ref 26.0–34.0)
MCHC: 33.2 g/dL (ref 32.0–36.0)
MCV: 88.9 fL (ref 80.0–100.0)
Platelets: 211 10*3/uL (ref 150–440)
RBC: 2.9 MIL/uL — ABNORMAL LOW (ref 3.80–5.20)
RDW: 19 % — ABNORMAL HIGH (ref 11.5–14.5)
WBC: 9.8 10*3/uL (ref 3.6–11.0)

## 2015-07-12 LAB — BASIC METABOLIC PANEL
Anion gap: 9 (ref 5–15)
BUN: 26 mg/dL — AB (ref 6–20)
CO2: 32 mmol/L (ref 22–32)
Calcium: 8.7 mg/dL — ABNORMAL LOW (ref 8.9–10.3)
Chloride: 95 mmol/L — ABNORMAL LOW (ref 101–111)
Creatinine, Ser: 0.81 mg/dL (ref 0.44–1.00)
GFR calc Af Amer: >60 mL/min (ref >60)
GLUCOSE: 164 mg/dL — AB (ref 65–99)
Potassium: 3.5 mmol/L (ref 3.5–5.1)
Sodium: 136 mmol/L (ref 135–145)

## 2015-07-12 LAB — GLUCOSE, CAPILLARY
GLUCOSE-CAPILLARY: 247 mg/dL — AB (ref 65–99)
Glucose-Capillary: 152 mg/dL — ABNORMAL HIGH (ref 65–99)
Glucose-Capillary: 268 mg/dL — ABNORMAL HIGH (ref 65–99)
Glucose-Capillary: 253 mg/dL — ABNORMAL HIGH (ref 65–99)

## 2015-07-12 MED ORDER — FUROSEMIDE 40 MG PO TABS
40.0000 mg | ORAL_TABLET | Freq: Two times a day (BID) | ORAL | Status: DC
Start: 2015-07-12 — End: 2015-07-13
  Administered 2015-07-12 – 2015-07-13 (×2): 40 mg via ORAL
  Filled 2015-07-12 (×2): qty 1

## 2015-07-12 MED ORDER — ALBUMIN HUMAN 25 % IV SOLN
12.5000 g | Freq: Two times a day (BID) | INTRAVENOUS | Status: AC
  Administered 2015-07-12 – 2015-07-13 (×2): 12.5 g via INTRAVENOUS
  Filled 2015-07-12 (×3): qty 50

## 2015-07-12 NOTE — Progress Notes (Signed)
Lasix gtt stopped per md orders

## 2015-07-12 NOTE — Progress Notes (Signed)
Subjective:   Patient laying in bed. States her edema and shortness of breath have improved.   Objective:  Vital signs in last 24 hours:  Temp:  [97.7 F (36.5 C)-98.6 F (37 C)] 97.7 F (36.5 C) (08/14 0953) Pulse Rate:  [50-109] 109 (08/14 0953) Resp:  [18-20] 18 (08/14 0953) BP: (137-156)/(61-81) 139/81 mmHg (08/14 0953) SpO2:  [96 %-98 %] 98 % (08/14 0953) Weight:  [86.002 kg (189 lb 9.6 oz)] 86.002 kg (189 lb 9.6 oz) (08/14 0534)  Weight change: -5.171 kg (-11 lb 6.4 oz) Filed Weights   07/10/15 0543 07/11/15 0545 07/12/15 0534  Weight: 88.134 kg (194 lb 4.8 oz) 91.173 kg (201 lb) 86.002 kg (189 lb 9.6 oz)    Intake/Output: I/O last 3 completed shifts: In: 871.7 [P.O.:600; I.V.:171.7; IV Piggyback:100] Out: 3825 [Urine:3825]   Intake/Output this shift:  Total I/O In: 3 [I.V.:3] Out: 550 [Urine:550]  Physical Exam: General: NAD  Head: Normocephalic, atraumatic. Moist oral mucosal membranes  Eyes: Anicteric,  Neck: Supple, trachea midline  Lungs:  Clear to auscultation  Heart: Irregular  Abdomen:  Soft, tender to palpation, +bowel sounds, mildly distended   Extremities: trace peripheral and dependent edema in all four extremities  Neurologic: Nonfocal, moving all four extremities  Skin: No acute lesions  GU  Foley present     Basic Metabolic Panel:  Recent Labs Lab 07/06/15 1311 07/07/15 0414 07/08/15 0435 07/09/15 0415 07/09/15 1439 07/10/15 0433 07/12/15 0516  NA  --  133* 134* 133*  --  137 136  K  --  3.6 3.0* 2.9* 3.7 3.1* 3.5  CL  --  91* 92* 91*  --  96* 95*  CO2  --  32 31 33*  --  34* 32  GLUCOSE  --  213* 125* 199*  --  79 164*  BUN  --  23* 23* 23*  --  23* 26*  CREATININE  --  0.75 0.82 0.72  --  0.74 0.81  CALCIUM  --  8.2* 8.3* 8.1*  --  8.4* 8.7*  MG 2.0  --   --   --  1.9  --   --     Liver Function Tests:  Recent Labs Lab 07/10/15 0433  AST 26  ALT 23  ALKPHOS 71  BILITOT 0.7  PROT 5.5*  ALBUMIN 3.4*   No results  for input(s): LIPASE, AMYLASE in the last 168 hours. No results for input(s): AMMONIA in the last 168 hours.  CBC:  Recent Labs Lab 07/07/15 0414 07/08/15 0518 07/09/15 0415 07/10/15 0433 07/12/15 0516  WBC 14.0* 25.9* 14.9* 13.6* 9.8  HGB 8.8* 8.7* 8.2* 8.1* 8.5*  HCT 26.4* 26.5* 25.0* 24.7* 25.8*  MCV 87.1 87.0 88.4 88.3 88.9  PLT 171 204 205 222 211    Cardiac Enzymes: No results for input(s): CKTOTAL, CKMB, CKMBINDEX, TROPONINI in the last 168 hours.  BNP: Invalid input(s): POCBNP  CBG:  Recent Labs Lab 07/10/15 2013 07/11/15 0739 07/11/15 1249 07/11/15 1716 07/12/15 0805  GLUCAP 231* 158* 258* 219* 152*    Microbiology: Results for orders placed or performed during the hospital encounter of 06/21/15  Stool culture     Status: None   Collection Time: 06/21/15  5:17 PM  Result Value Ref Range Status   Specimen Description STOOL  Final   Special Requests Normal  Final   Culture   Final    NO SALMONELLA OR SHIGELLA ISOLATED No Pathogenic E. coli detected NO CAMPYLOBACTER DETECTED  Report Status 06/23/2015 FINAL  Final  C difficile quick scan w PCR reflex ([facility] only)     Status: Abnormal   Collection Time: 06/21/15  5:17 PM  Result Value Ref Range Status   C Diff antigen POSITIVE (A) NEGATIVE Final   C Diff toxin POSITIVE (A) NEGATIVE Final   C Diff interpretation   Final    Positive for toxigenic C. difficile, active toxin production present.    Comment: CRITICAL RESULT CALLED TO, READ BACK BY AND VERIFIED WITH: DONALD SWEENEY ON 06/21/15 AT 1755 BY JEF   MRSA PCR Screening     Status: None   Collection Time: 06/25/15 10:48 AM  Result Value Ref Range Status   MRSA by PCR NEGATIVE NEGATIVE Final    Comment:        The GeneXpert MRSA Assay (FDA approved for NASAL specimens only), is one component of a comprehensive MRSA colonization surveillance program. It is not intended to diagnose MRSA infection nor to guide or monitor treatment for MRSA  infections.   Urine culture     Status: None   Collection Time: 07/06/15 11:38 AM  Result Value Ref Range Status   Specimen Description URINE, RANDOM  Final   Special Requests NONE  Final   Culture 30,000 COLONIES/mL PROTEUS MIRABILIS  Final   Report Status 07/08/2015 FINAL  Final   Organism ID, Bacteria PROTEUS MIRABILIS  Final      Susceptibility   Proteus mirabilis - MIC*    AMPICILLIN <=2 SENSITIVE Sensitive     CEFTAZIDIME <=1 SENSITIVE Sensitive     CEFAZOLIN <=4 SENSITIVE Sensitive     CEFTRIAXONE <=1 SENSITIVE Sensitive     CIPROFLOXACIN <=0.25 SENSITIVE Sensitive     GENTAMICIN <=1 SENSITIVE Sensitive     IMIPENEM 2 SENSITIVE Sensitive     TRIMETH/SULFA <=20 SENSITIVE Sensitive     NITROFURANTOIN Value in next row Resistant      RESISTANT128    PIP/TAZO Value in next row Sensitive      SENSITIVE<=4    AMPICILLIN/SULBACTAM Value in next row Sensitive      SENSITIVE<=2    * 30,000 COLONIES/mL PROTEUS MIRABILIS  Culture, blood (routine x 2)     Status: None (Preliminary result)   Collection Time: 07/08/15  8:27 AM  Result Value Ref Range Status   Specimen Description BLOOD RIGHT HAND  Final   Special Requests BOTTLES DRAWN AEROBIC AND ANAEROBIC 10CC  Final   Culture NO GROWTH 4 DAYS  Final   Report Status PENDING  Incomplete  Culture, blood (routine x 2)     Status: None (Preliminary result)   Collection Time: 07/08/15  8:34 AM  Result Value Ref Range Status   Specimen Description BLOOD RIGHT HAND  Final   Special Requests BOTTLES DRAWN AEROBIC AND ANAEROBIC 7CC  Final   Culture NO GROWTH 4 DAYS  Final   Report Status PENDING  Incomplete    Coagulation Studies: No results for input(s): LABPROT, INR in the last 72 hours.  Urinalysis: No results for input(s): COLORURINE, LABSPEC, PHURINE, GLUCOSEU, HGBUR, BILIRUBINUR, KETONESUR, PROTEINUR, UROBILINOGEN, NITRITE, LEUKOCYTESUR in the last 72 hours.  Invalid input(s): APPERANCEUR    Imaging: No results  found.   Medications:   . furosemide (LASIX) infusion 8 mg/hr (07/12/15 0544)   . acidophilus  1 capsule Oral BID  . albumin human  12.5 g Intravenous BID  . antiseptic oral rinse  7 mL Mouth Rinse BID  . aspirin  81 mg Oral Daily  .  atorvastatin  80 mg Oral QPM  . benazepril  40 mg Oral BID  . carvedilol  25 mg Oral BID  . cloNIDine  0.1 mg Oral TID  . digoxin  0.25 mg Oral Daily  . diltiazem  360 mg Oral Daily  . dorzolamide  1 drop Both Eyes TID  . enoxaparin (LOVENOX) injection  40 mg Subcutaneous Q24H  . famotidine  20 mg Oral Q supper  . feeding supplement (GLUCERNA SHAKE)  237 mL Oral QID  . hydrocortisone  25 mg Rectal BID  . hydrocortisone cream   Topical QID  . insulin aspart  0-15 Units Subcutaneous TID WC  . insulin aspart  0-5 Units Subcutaneous QHS  . insulin glargine  26 Units Subcutaneous QHS  . latanoprost  1 drop Both Eyes QPM  . metroNIDAZOLE  500 mg Oral 3 times per day  . multivitamin with minerals  1 tablet Oral q morning - 10a  . nystatin  5 mL Oral QID  . nystatin   Topical BID  . potassium chloride  40 mEq Oral BID  . saccharomyces boulardii  250 mg Oral BID  . sertraline  50 mg Oral Daily  . sodium chloride  3 mL Intravenous Q12H  . spironolactone  25 mg Oral Daily  . timolol  1 drop Both Eyes BID  . vancomycin  250 mg Oral 4 times per day   albuterol, alum & mag hydroxide-simeth, diphenhydrAMINE, enalaprilat, labetalol, morphine injection, ondansetron (ZOFRAN) IV, phenylephrine-shark liver oil-mineral oil-petrolatum, simethicone, sodium chloride, traMADol, zolpidem  Assessment/ Plan:  Katherine [name] is a 81 y.o. [place] female with diabetes mellitus type II, hypertension, congestive heart failure, hyperlipidemia, coronary artery disease, atrial fibrillation, GERD, mitral valve disorder who was admitted to [facility] on 06/21/2015 for C. Diff colitis. Patient was found to have a serum sodium of 122. Baseline of 132 (05/03/2015).   1.  Generalized  Edema: with low albumin. Recent echo shows normal LVEF. Abdominal imaging did not show liver cirrhosis. - Will stop furosemide IV infusion today.  - albumin supplementation IV twice a day. May stop tomorrow.  - monitor weight and urine output.   2. Hyponatremia: secondary to hypervolemia.  - improved with diuresis  3. Hypokalemia: due to IV furosemide.  - potassium chloride - Continue spironolactone for added diuresis and potassium conservation  4. Acute kidney injury : creatinine now back to baseline.  - monitor volume status, urine output and renal function   5. Hypertension with atrial fibrillation with RVR: still with some blood pressure elevations.  - benazepril, clonidine, carvedilol, diltiazem, and furosemide and spironolactone as above.       LOS: 21 Katherine [name] 8/14/201610:47 AM

## 2015-07-12 NOTE — Progress Notes (Signed)
[facility] [place] [facility]   PATIENT NAME: Katherine [name]    MR#:  [id number]  DATE OF BIRTH:  Nov 30, 1931  SUBJECTIVE:   Admitted for c diff. Still weak, poor appetite.  Lasix drip stopped earlier today. Overall edema much improved.  Needs SNF at discharge.  REVIEW OF SYSTEMS:   Review of Systems  Constitutional: Negative for fever, chills and weight loss.  HENT: Negative for ear discharge, ear pain and nosebleeds.   Eyes: Negative for blurred vision, pain and discharge.  Respiratory: Negative for sputum production, shortness of breath, wheezing and stridor.   Cardiovascular: Positive for leg swelling. Negative for chest pain, palpitations, orthopnea and PND.  Gastrointestinal: Positive for diarrhea. Negative for vomiting and blood in stool.  Genitourinary: Negative for urgency and frequency.  Musculoskeletal: Negative for back pain and joint pain.  Neurological: Positive for weakness. Negative for sensory change, speech change and focal weakness.  Psychiatric/Behavioral: Negative for depression. The patient is not nervous/anxious.   All other systems reviewed and are negative.   DRUG ALLERGIES:   Allergies  Allergen Reactions  . Benzocaine Other (See Comments)    Unknown reaction  . Contrast Media [Iodinated Diagnostic Agents] Other (See Comments)    Tachycardia, SVT  . Other Other (See Comments)    MRI dye--reaction unknown.  Marland Kitchen Penicillin V Potassium Other (See Comments)  . Penicillins Other (See Comments)    Reaction: pt doesn't know, b/c it's been a long time ago.   . Sulfa Antibiotics Other (See Comments)    Reaction: pt doesn't know, b/c it's been a long time ago.    VITALS:  Blood pressure 139/81, pulse 109, temperature 97.7 F (36.5 C), temperature source Oral, resp. rate 18, height  (1.6 m), weight 86.002 kg (189 lb 9.6 oz), SpO2 98 %. PHYSICAL EXAMINATION:  Physical Exam  Constitutional: She is oriented to person,  place, and time and well-developed, well-nourished, and in no distress.  HENT:  Head: Normocephalic and atraumatic.  Eyes: Conjunctivae and EOM are normal. Pupils are equal, round, and reactive to light.  Neck: Normal range of motion. Neck supple. No tracheal deviation present. No thyromegaly present.  Cardiovascular: Normal rate, regular rhythm and normal heart sounds.   Pulmonary/Chest: Effort normal and breath sounds normal. No respiratory distress. She has no wheezes. She exhibits no tenderness.  Abdominal: Soft. Bowel sounds are normal. She exhibits distension (gaseous). There is tenderness.  Musculoskeletal: Normal range of motion. She exhibits edema.  Neurological: She is alert and oriented to person, place, and time. No cranial nerve deficit.  Skin: Skin is warm and dry. No rash noted.  Psychiatric: Mood and affect normal.  Pallor positive  LABORATORY PANEL:   CBC  Recent Labs Lab 07/12/15 0516  WBC 9.8  HGB 8.5*  HCT 25.8*  PLT 211    Chemistries   Recent Labs Lab 07/09/15 1439 07/10/15 0433 07/12/15 0516  NA  --  137 136  K 3.7 3.1* 3.5  CL  --  96* 95*  CO2  --  34* 32  GLUCOSE  --  79 164*  BUN  --  23* 26*  CREATININE  --  0.74 0.81  CALCIUM  --  8.4* 8.7*  MG 1.9  --   --   AST  --  26  --   ALT  --  23  --   ALKPHOS  --  71  --   BILITOT  --  0.7  --  Cardiac Enzymes No results for input(s): TROPONINI in the last 168 hours. RADIOLOGY:  No results found. ASSESSMENT AND PLAN:  81 yr old W F with multiple medical problmes got out of rehab after having treated with IV abxs for Orbital cellulitis. Presented after a fall at home and ongoing diarrhea.   * C. Diff: Collitis: Diarrhea some improvement. On PO vancomycin, Florostar. GI helping with managing. Added flagyl 07/09/2015.  * Leucocytosis Likely from C diff And UTI. Improving. Ceftriaxone for UTI stopped 07/10/2015. Finished 5 days  * GIB possible due to hemmoroid:   ASA, heparin SQ,  Hb is dropped to 7.3 ( 07/06/15)   Transfused one unit PRBC ( 07/06/15)- Hb came up now.  * UTI, proteus Finished Abx  * Oral thrush   On nystatin oral.  * Hypokalemia  due to lasix IV Increase daily Kcl to BID. Monitor  *  Acute on chronic hypervolemic hyponatremia Improving with lasix. Change to PO  * DM. Blood sugar is better, continue Lantus and Continue sliding scale.   * A-fib with RVR.   off carddizem drip, continue ASA,  Continue carvedilol, diltiazem, digoxin. D/C lopressor. Increased dose of cardizem Echo shows normal EF at 60%.  * left elbow pain after fall with abnromal xray. Improved. -appreciate ortho input - no pathology  * Hypertension    continue benazepril, carvedilol, diltiazem, lopressor, clonidine, furosemide, hydralazine  IV hydralazine prn.  * Pleural effusion.  from volume overload and sepsis. continue Lasix PO  * Acute on chronic diastolic CHF (EF at 60-65%). Decompensated due to infection and IVF. Continue lasix and  Benazepril.  * Ileus. Secondary to severe C. difficile colitis.  Improved per abd xray. Advanced diet.  SNF ON MONDAY  CODE STATUS: full  DVT Prophylaxis: Lovenox  TOTAL TIME TAKING CARE OF THIS PATIENT: 35 minutes.    Milagros Loll R M.D on 07/12/2015 at 11:51 AM  Between 7am to 6pm - Pager - [phone]  After 6pm go to [email or web address] - password [email or web address] [facility]  [place] [facility]  Office  [phone]  CC: Primary care physician; Mila Merry, MD

## 2015-07-12 NOTE — Care Management Note (Signed)
Case Management Note  Patient Details  Name: CHAQUITA BASQUES MRN: [id number] Date of Birth: May 28, 1932  Subjective/Objective:     Received a call from Corrine at [facility]. Corrine will reassess Ms Cheek for admission to [facility] on Monday.                Action/Plan:   Expected Discharge Date:                  Expected Discharge Plan:     In-House Referral:     Discharge planning Services     Post Acute Care Choice:    Choice offered to:     DME Arranged:    DME Agency:     HH Arranged:    HH Agency:     Status of Service:     Medicare Important Message Given:  Other (see comment) (Notification given) Date Medicare IM Given:    Medicare IM give by:    Date Additional Medicare IM Given:    Additional Medicare Important Message give by:     If discussed at Long Length of Stay Meetings, dates discussed:    Additional Comments:  Jakeb Lamping A, RN 07/12/2015, 2:30 PM

## 2015-07-13 ENCOUNTER — Inpatient Hospital Stay: Payer: Medicare Other

## 2015-07-13 ENCOUNTER — Inpatient Hospital Stay
Admission: EM | Admit: 2015-07-13 | Discharge: 2015-07-17 | Disposition: A | Discharge status: Skilled Nursing Facility | Payer: Medicare Other | Source: Home / Self Care | Attending: Internal Medicine | Admitting: Internal Medicine

## 2015-07-13 DIAGNOSIS — K625 Hemorrhage of anus and rectum: Secondary | ICD-10-CM

## 2015-07-13 DIAGNOSIS — L8990 Pressure ulcer of unspecified site, unspecified stage: Secondary | ICD-10-CM | POA: Insufficient documentation

## 2015-07-13 DIAGNOSIS — K922 Gastrointestinal hemorrhage, unspecified: Secondary | ICD-10-CM

## 2015-07-13 DIAGNOSIS — R338 Other retention of urine: Secondary | ICD-10-CM

## 2015-07-13 DIAGNOSIS — N179 Acute kidney failure, unspecified: Secondary | ICD-10-CM

## 2015-07-13 DIAGNOSIS — E43 Unspecified severe protein-calorie malnutrition: Secondary | ICD-10-CM | POA: Diagnosis present

## 2015-07-13 DIAGNOSIS — E876 Hypokalemia: Secondary | ICD-10-CM | POA: Diagnosis present

## 2015-07-13 DIAGNOSIS — I509 Heart failure, unspecified: Secondary | ICD-10-CM

## 2015-07-13 DIAGNOSIS — E119 Type 2 diabetes mellitus without complications: Secondary | ICD-10-CM

## 2015-07-13 DIAGNOSIS — N390 Urinary tract infection, site not specified: Secondary | ICD-10-CM

## 2015-07-13 LAB — CBC WITH DIFFERENTIAL/PLATELET
BASOS ABS: 0 10*3/uL (ref 0–0.1)
BASOS PCT: 0 %
Eosinophils Absolute: 0 10*3/uL (ref 0–0.7)
Eosinophils Relative: 0 %
HEMATOCRIT: 26.6 % — AB (ref 35.0–47.0)
HEMOGLOBIN: 8.6 g/dL — AB (ref 12.0–16.0)
Lymphocytes Relative: 14 %
Lymphs Abs: 1.4 10*3/uL (ref 1.0–3.6)
MCH: 29 pg (ref 26.0–34.0)
MCHC: 32.4 g/dL (ref 32.0–36.0)
MCV: 89.6 fL (ref 80.0–100.0)
MONOS PCT: 6 %
Monocytes Absolute: 0.6 10*3/uL (ref 0.2–0.9)
NEUTROS ABS: 8.3 10*3/uL — AB (ref 1.4–6.5)
NEUTROS PCT: 80 %
Platelets: 236 10*3/uL (ref 150–440)
RBC: 2.97 MIL/uL — ABNORMAL LOW (ref 3.80–5.20)
RDW: 19.3 % — ABNORMAL HIGH (ref 11.5–14.5)
WBC: 10.4 10*3/uL (ref 3.6–11.0)

## 2015-07-13 LAB — BASIC METABOLIC PANEL
ANION GAP: 10 (ref 5–15)
BUN: 28 mg/dL — ABNORMAL HIGH (ref 6–20)
CALCIUM: 8.9 mg/dL (ref 8.9–10.3)
CHLORIDE: 96 mmol/L — AB (ref 101–111)
CO2: 29 mmol/L (ref 22–32)
CREATININE: 0.8 mg/dL (ref 0.44–1.00)
GFR calc Af Amer: >60 mL/min (ref >60)
GFR calc non Af Amer: >60 mL/min (ref >60)
GLUCOSE: 205 mg/dL — AB (ref 65–99)
Potassium: 4.4 mmol/L (ref 3.5–5.1)
Sodium: 135 mmol/L (ref 135–145)

## 2015-07-13 LAB — GLUCOSE, CAPILLARY
GLUCOSE-CAPILLARY: 116 mg/dL — AB (ref 65–99)
GLUCOSE-CAPILLARY: 274 mg/dL — AB (ref 65–99)

## 2015-07-13 LAB — CULTURE, BLOOD (ROUTINE X 2)
Culture: NO GROWTH
Culture: NO GROWTH

## 2015-07-13 LAB — URINALYSIS COMPLETE WITH MICROSCOPIC (ARMC ONLY)
BACTERIA UA: NONE SEEN
Bilirubin Urine: NEGATIVE
Glucose, UA: 50 mg/dL — AB
KETONES UR: NEGATIVE mg/dL
Nitrite: NEGATIVE
PH: 6 (ref 5.0–8.0)
Specific Gravity, Urine: 1.016 (ref 1.005–1.030)

## 2015-07-13 LAB — PROTIME-INR
INR: 1.98
Prothrombin Time: 22.7 seconds — ABNORMAL HIGH (ref 11.4–15.0)

## 2015-07-13 LAB — PREPARE RBC (CROSSMATCH)

## 2015-07-13 LAB — HEMOGLOBIN: Hemoglobin: 7.6 g/dL — ABNORMAL LOW (ref 12.0–16.0)

## 2015-07-13 MED ORDER — PANTOPRAZOLE SODIUM 40 MG IV SOLR
40.0000 mg | Freq: Two times a day (BID) | INTRAVENOUS | Status: DC
  Administered 2015-07-13 – 2015-07-17 (×8): 40 mg via INTRAVENOUS
  Filled 2015-07-13 (×10): qty 40

## 2015-07-13 MED ORDER — RISAQUAD PO CAPS
1.0000 | ORAL_CAPSULE | Freq: Two times a day (BID) | ORAL | Status: DC
  Administered 2015-07-14 – 2015-07-17 (×7): 1 via ORAL
  Filled 2015-07-13 (×8): qty 1

## 2015-07-13 MED ORDER — ALBUTEROL SULFATE (2.5 MG/3ML) 0.083% IN NEBU: 2.5000 mg | INHALATION_SOLUTION | RESPIRATORY_TRACT | Status: DC | PRN

## 2015-07-13 MED ORDER — RISAQUAD PO CAPS: 1.0000 | ORAL_CAPSULE | Freq: Two times a day (BID) | ORAL | Status: AC

## 2015-07-13 MED ORDER — CLONIDINE HCL 0.1 MG PO TABS
0.1000 mg | ORAL_TABLET | Freq: Three times a day (TID) | ORAL | Status: DC
  Administered 2015-07-14 – 2015-07-17 (×10): 0.1 mg via ORAL
  Filled 2015-07-13 (×10): qty 1

## 2015-07-13 MED ORDER — SPIRONOLACTONE 25 MG PO TABS
25.0000 mg | ORAL_TABLET | Freq: Every day | ORAL | Status: DC
  Administered 2015-07-14 – 2015-07-17 (×4): 25 mg via ORAL
  Filled 2015-07-13 (×5): qty 1

## 2015-07-13 MED ORDER — HYOSCYAMINE SULFATE 0.125 MG PO TABS
0.1250 mg | ORAL_TABLET | Freq: Every day | ORAL | Status: DC
  Administered 2015-07-14 – 2015-07-15 (×2): 0.125 mg via ORAL
  Filled 2015-07-13 (×4): qty 1

## 2015-07-13 MED ORDER — TIMOLOL MALEATE 0.25 % OP SOLN
1.0000 [drp] | Freq: Two times a day (BID) | OPHTHALMIC | Status: DC
  Administered 2015-07-13 – 2015-07-17 (×8): 1 [drp] via OPHTHALMIC
  Filled 2015-07-13: qty 5

## 2015-07-13 MED ORDER — LINAGLIPTIN 5 MG PO TABS
5.0000 mg | ORAL_TABLET | Freq: Every day | ORAL | Status: DC
  Administered 2015-07-14 – 2015-07-17 (×4): 5 mg via ORAL
  Filled 2015-07-13 (×4): qty 1

## 2015-07-13 MED ORDER — DILTIAZEM HCL ER COATED BEADS 240 MG PO CP24
240.0000 mg | ORAL_CAPSULE | Freq: Every day | ORAL | Status: DC
  Administered 2015-07-14 – 2015-07-17 (×4): 240 mg via ORAL
  Filled 2015-07-13 (×5): qty 1

## 2015-07-13 MED ORDER — LABETALOL HCL 5 MG/ML IV SOLN: 10.0000 mg | INTRAVENOUS | Status: DC | PRN

## 2015-07-13 MED ORDER — SODIUM CHLORIDE 0.9 % IV SOLN: Freq: Once | INTRAVENOUS | Status: DC

## 2015-07-13 MED ORDER — SODIUM CHLORIDE 0.9 % IJ SOLN
3.0000 mL | Freq: Two times a day (BID) | INTRAMUSCULAR | Status: DC
  Administered 2015-07-13 – 2015-07-16 (×7): 3 mL via INTRAVENOUS

## 2015-07-13 MED ORDER — VANCOMYCIN 50 MG/ML ORAL SOLUTION
250.0000 mg | Freq: Four times a day (QID) | ORAL | Status: DC
  Administered 2015-07-13 – 2015-07-17 (×15): 250 mg via ORAL
  Filled 2015-07-13 (×20): qty 5

## 2015-07-13 MED ORDER — DILTIAZEM HCL ER COATED BEADS 240 MG PO CP24: 240.0000 mg | ORAL_CAPSULE | Freq: Every day | ORAL | Status: AC

## 2015-07-13 MED ORDER — TECHNETIUM TC 99M-LABELED RED BLOOD CELLS IV KIT
20.0000 | PACK | Freq: Once | INTRAVENOUS | Status: AC | PRN
  Administered 2015-07-13: 21.36 via INTRAVENOUS

## 2015-07-13 MED ORDER — CARVEDILOL 25 MG PO TABS
25.0000 mg | ORAL_TABLET | Freq: Two times a day (BID) | ORAL | Status: DC
  Administered 2015-07-13 – 2015-07-17 (×8): 25 mg via ORAL
  Filled 2015-07-13 (×2): qty 2
  Filled 2015-07-13: qty 1
  Filled 2015-07-13: qty 2
  Filled 2015-07-13 (×3): qty 1
  Filled 2015-07-13: qty 2

## 2015-07-13 MED ORDER — ONDANSETRON HCL 4 MG PO TABS: 4.0000 mg | ORAL_TABLET | Freq: Four times a day (QID) | ORAL | Status: DC | PRN

## 2015-07-13 MED ORDER — ATORVASTATIN CALCIUM 20 MG PO TABS
80.0000 mg | ORAL_TABLET | Freq: Every day | ORAL | Status: DC
  Administered 2015-07-13 – 2015-07-16 (×4): 80 mg via ORAL
  Filled 2015-07-13 (×5): qty 4

## 2015-07-13 MED ORDER — POTASSIUM CHLORIDE CRYS ER 10 MEQ PO TBCR
10.0000 meq | EXTENDED_RELEASE_TABLET | Freq: Every day | ORAL | Status: DC
  Administered 2015-07-14 – 2015-07-16 (×3): 10 meq via ORAL
  Filled 2015-07-13 (×4): qty 1

## 2015-07-13 MED ORDER — INSULIN GLARGINE 100 UNIT/ML ~~LOC~~ SOLN: 26.0000 [IU] | Freq: Every day | SUBCUTANEOUS | Status: AC

## 2015-07-13 MED ORDER — METOCLOPRAMIDE HCL 10 MG PO TABS
5.0000 mg | ORAL_TABLET | Freq: Three times a day (TID) | ORAL | Status: DC
  Administered 2015-07-13 – 2015-07-17 (×15): 5 mg via ORAL
  Filled 2015-07-13: qty 1
  Filled 2015-07-13: qty 2
  Filled 2015-07-13 (×3): qty 1
  Filled 2015-07-13: qty 5
  Filled 2015-07-13 (×8): qty 1

## 2015-07-13 MED ORDER — HYDRALAZINE HCL 50 MG PO TABS
100.0000 mg | ORAL_TABLET | Freq: Three times a day (TID) | ORAL | Status: DC
  Administered 2015-07-13 – 2015-07-17 (×11): 100 mg via ORAL
  Filled 2015-07-13 (×12): qty 2

## 2015-07-13 MED ORDER — VANCOMYCIN 50 MG/ML ORAL SOLUTION: 250.0000 mg | Freq: Four times a day (QID) | ORAL | Status: DC

## 2015-07-13 MED ORDER — FUROSEMIDE 20 MG PO TABS
20.0000 mg | ORAL_TABLET | Freq: Two times a day (BID) | ORAL | Status: DC
Start: 2015-07-13 — End: 2015-07-16
  Administered 2015-07-13 – 2015-07-15 (×5): 20 mg via ORAL
  Filled 2015-07-13 (×5): qty 1

## 2015-07-13 MED ORDER — DIGOXIN 250 MCG PO TABS
0.2500 mg | ORAL_TABLET | Freq: Every day | ORAL | Status: DC
Start: 2015-07-13 — End: 2015-07-14
  Administered 2015-07-14: 0.25 mg via ORAL
  Filled 2015-07-13 (×2): qty 1

## 2015-07-13 MED ORDER — SERTRALINE HCL 50 MG PO TABS
50.0000 mg | ORAL_TABLET | Freq: Every day | ORAL | Status: DC
  Administered 2015-07-14 – 2015-07-17 (×4): 50 mg via ORAL
  Filled 2015-07-13 (×5): qty 1

## 2015-07-13 MED ORDER — BENAZEPRIL HCL 20 MG PO TABS
40.0000 mg | ORAL_TABLET | Freq: Every day | ORAL | Status: DC
  Administered 2015-07-14 – 2015-07-17 (×4): 40 mg via ORAL
  Filled 2015-07-13 (×4): qty 2

## 2015-07-13 MED ORDER — LATANOPROST 0.005 % OP SOLN
1.0000 [drp] | Freq: Every day | OPHTHALMIC | Status: DC
  Administered 2015-07-13 – 2015-07-15 (×3): 1 [drp] via OPHTHALMIC
  Filled 2015-07-13: qty 2.5

## 2015-07-13 MED ORDER — FAMOTIDINE 20 MG PO TABS: 20.0000 mg | ORAL_TABLET | Freq: Every day | ORAL | Status: AC

## 2015-07-13 MED ORDER — ONDANSETRON HCL 4 MG/2ML IJ SOLN: 4.0000 mg | Freq: Four times a day (QID) | INTRAMUSCULAR | Status: DC | PRN

## 2015-07-13 MED ORDER — SACCHAROMYCES BOULARDII 250 MG PO CAPS
250.0000 mg | ORAL_CAPSULE | Freq: Two times a day (BID) | ORAL | Status: DC
  Administered 2015-07-14 – 2015-07-17 (×7): 250 mg via ORAL
  Filled 2015-07-13 (×7): qty 1

## 2015-07-13 MED ORDER — DORZOLAMIDE HCL 2 % OP SOLN
1.0000 [drp] | Freq: Three times a day (TID) | OPHTHALMIC | Status: DC
Start: 2015-07-13 — End: 2015-07-17
  Administered 2015-07-13 – 2015-07-17 (×10): 1 [drp] via OPHTHALMIC
  Filled 2015-07-13: qty 10

## 2015-07-13 MED ORDER — DIGOXIN 250 MCG PO TABS: 0.2500 mg | ORAL_TABLET | Freq: Every day | ORAL | Status: DC

## 2015-07-13 MED ORDER — GLUCERNA SHAKE PO LIQD: 237.0000 mL | Freq: Two times a day (BID) | ORAL | Status: DC

## 2015-07-13 MED ORDER — SPIRONOLACTONE 25 MG PO TABS: 25.0000 mg | ORAL_TABLET | Freq: Every day | ORAL | Status: AC

## 2015-07-13 MED ORDER — CLONIDINE HCL 0.1 MG PO TABS: 0.1000 mg | ORAL_TABLET | Freq: Three times a day (TID) | ORAL | Status: AC

## 2015-07-13 MED ORDER — SACCHAROMYCES BOULARDII 250 MG PO CAPS: 250.0000 mg | ORAL_CAPSULE | Freq: Two times a day (BID) | ORAL | Status: AC

## 2015-07-13 NOTE — Consult Note (Signed)
WOC wound follow up Wound type:Moisture Associated Skin Damage (MASD), specifically Incontinence Associated Skin Damage.  Measurement: 15 cm x 7 cm x 0.1 cm denuded skin Wound bed: pink, some new epithelial growth noted.   Drainage (amount, consistency, odor) Minimal serous weeping  No odor.  Continues with loose stools Periwound: Denuded Dressing procedure/placement/frequency:Keep denuded perianal skin clean and dry.  No disposables to skin.  Continue to dust daily with Nystatin powder and cover with barrier ointment.  Patient is on a low air loss mattress.  Will not follow at this time.  Please re-consult if needed.  Maple Hudson RN BSN CWON Pager [phone]

## 2015-07-13 NOTE — Progress Notes (Signed)
Patient is discharge to [facility], in a stable condition, still C-DIFF positive, with isolation precauction enforcedbil groin and buttock still raw and red with protective cream apply, report given to floor nurser Nichole, left by EMS

## 2015-07-13 NOTE — Discharge Summary (Signed)
[facility] [place] [facility]   PATIENT NAME: Katherine [name]    MR#:  [id number]  DATE OF BIRTH:  10-15-1932  DATE OF ADMISSION:  06/21/2015 ADMITTING PHYSICIAN: Gracelyn Nurse, MD  DATE OF DISCHARGE: No discharge date for patient encounter.  PRIMARY CARE PHYSICIAN: Mila Merry, MD    ADMISSION DIAGNOSIS:  Edema [R60.9] Hyponatremia [E87.1] Fall [W19.XXXA] C. difficile colitis [A04.7] Anemia, unspecified anemia type [D64.9]  DISCHARGE DIAGNOSIS:  Active Problems:   A-fib   C. difficile colitis   Hyponatremia   Acute respiratory failure   Pleural effusion   Atelectasis   Sepsis   Fall   Ileus   Hypoglycemia associated with diabetes   Essential hypertension   Pneumonia   Hypokalemia   UTI (urinary tract infection)   SECONDARY DIAGNOSIS:   Past Medical History  Diagnosis Date  . Diabetes mellitus without complication   . Hypertension   . CCF (congestive cardiac failure) 06/04/2015  . Varicose veins     of leg  . Bowel habit changes   . Anemia   . Diabetes mellitus with renal manifestation 03/19/2009  . Vitamin D deficiency   . Hyperglyceridemia, pure   . Hyperlipidemia 02/01/2010  . Hyponatremia   . Mitral valve disorder 04/01/2009  . Atherosclerosis of coronary artery 08/04/2009  . CHF (congestive heart failure)     EF 30-35% on echo 2014  . Allergy 04/04/2009  . GERD (gastroesophageal reflux disease) 11/06/2009  . Hemorrhoids, external without complications 08/04/2009  . Arthritis   . Arthralgia   . Myalgia   . Palpitations   . Chronic nausea   . Malaise and fatigue   . Edema   . Atrial fibrillation 04/01/2009  . Dysuria      ADMITTING HISTORY  Pt was in hospital at [facility] recently for eye infection and heart problems. Discharged to rehab where she receive IV abx. Started having diarrhea before being discharged from rehab. Today fell because she was weak and was brought to hospital, Stool tested positive for c.  Diff.   HOSPITAL COURSE:   81 yr old W F with multiple medical problmes got out of rehab after having treated with IV abxs for Orbital cellulitis. Presented after a fall at home and ongoing diarrhea.   * C. Diff: Collitis: Diarrhea some improvement. On PO vancomycin, Florostar. GI helping with managing. PO vancomycin for 1 more week after discharge  * Leucocytosis Likely from C diff And UTI. Improving. Ceftriaxone for UTI stopped 07/10/2015. Finished 5 days  * GIB possible due to hemmoroid:  ASA, heparin SQ, Hb is dropped to 7.3 ( 07/06/15)  Transfused one unit PRBC ( 07/06/15)- Hb came up now.  * UTI, proteus Finished Abx  * Oral thrush  On nystatin oral.  * Hypokalemia due to lasix IV Daily Kcl  * Acute on chronic hypervolemic hyponatremia Improving with lasix. Changed to PO  * DM. Blood sugar is better, continue Lantus and Continue sliding scale.   * A-fib with RVR. off carddizem drip, continue ASA, Continue carvedilol, diltiazem, digoxin. D/C lopressor. Increased dose of cardizem Echo shows normal EF at 60%.  * left elbow pain after fall with abnromal xray. Improved. -appreciate ortho input - no pathology  * Hypertension  continue benazepril, carvedilol, diltiazem, lopressor, clonidine, furosemide, hydralazine IV hydralazine prn used in hsopital   * Pleural effusion. from volume overload and sepsis. continue Lasix PO  * Acute on chronic diastolic CHF (EF at 60-65%). Decompensated due to  infection and IVF. Improved Continue lasix and Benazepril.  * Ileus. Secondary to severe C. difficile colitis. Resolved.  Stable for discharge to SNF. Needs GI f/u   CONSULTS OBTAINED:  Treatment Team:  Kennedy Bucker, MD Delfino Lovett, MD Scot Jun, MD Lamont Dowdy, MD Lamar Blinks, MD  DRUG ALLERGIES:   Allergies  Allergen Reactions  . Benzocaine Other (See Comments)    Unknown reaction  . Contrast Media [Iodinated Diagnostic Agents]  Other (See Comments)    Tachycardia, SVT  . Other Other (See Comments)    MRI dye--reaction unknown.  Marland Kitchen Penicillin V Potassium Other (See Comments)  . Penicillins Other (See Comments)    Reaction: pt doesn't know, b/c it's been a long time ago.   . Sulfa Antibiotics Other (See Comments)    Reaction: pt doesn't know, b/c it's been a long time ago.     DISCHARGE MEDICATIONS:   Current Discharge Medication List    START taking these medications   Details  acidophilus (RISAQUAD) CAPS capsule Take 1 capsule by mouth 2 (two) times daily. Qty: 60 capsule, Refills: 0    digoxin (LANOXIN) 0.25 MG tablet Take 1 tablet (0.25 mg total) by mouth daily.    diltiazem (CARDIZEM CD) 240 MG 24 hr capsule Take 1 capsule (240 mg total) by mouth daily.    famotidine (PEPCID) 20 MG tablet Take 1 tablet (20 mg total) by mouth daily.    feeding supplement, GLUCERNA SHAKE, (GLUCERNA SHAKE) LIQD Take 237 mLs by mouth 2 (two) times daily between meals. Refills: 0    saccharomyces boulardii (FLORASTOR) 250 MG capsule Take 1 capsule (250 mg total) by mouth 2 (two) times daily.    spironolactone (ALDACTONE) 25 MG tablet Take 1 tablet (25 mg total) by mouth daily.    vancomycin (VANCOCIN) 50 mg/mL oral solution Take 5 mLs (250 mg total) by mouth every 6 (six) hours. Qty: 140 mL, Refills: 0      CONTINUE these medications which have CHANGED   Details  cloNIDine (CATAPRES) 0.1 MG tablet Take 1 tablet (0.1 mg total) by mouth 3 (three) times daily. Qty: 60 tablet, Refills: 11    insulin glargine (LANTUS) 100 UNIT/ML injection Inject 0.26 mLs (26 Units total) into the skin at bedtime. Qty: 10 mL, Refills: 11      CONTINUE these medications which have NOT CHANGED   Details  aspirin 81 MG tablet Take 81 mg by mouth daily.    atorvastatin (LIPITOR) 80 MG tablet Take 80 mg by mouth every evening.    benazepril (LOTENSIN) 40 MG tablet Take 1 tablet by mouth daily.     carvedilol (COREG) 25 MG tablet  Take 25 mg by mouth 2 (two) times daily.    dorzolamide (TRUSOPT) 2 % ophthalmic solution Place 1 drop into both eyes 3 (three) times daily.    furosemide (LASIX) 20 MG tablet Take 20 mg by mouth 2 (two) times daily.     hydrALAZINE (APRESOLINE) 100 MG tablet Take 100 mg by mouth 3 (three) times daily.    hydrocortisone cream 1 % Apply 1 application topically every 12 (twelve) hours as needed for itching (rash).    hyoscyamine (LEVSIN, ANASPAZ) 0.125 MG tablet Take 0.125 mg by mouth daily.     latanoprost (XALATAN) 0.005 % ophthalmic solution Place 1 drop into both eyes at bedtime.     metoCLOPramide (REGLAN) 5 MG tablet Take 5 mg by mouth 4 (four) times daily -  before meals and at bedtime.  potassium chloride (KLOR-CON M10) 10 MEQ tablet Take 10 mEq by mouth daily.    sertraline (ZOLOFT) 50 MG tablet Take 50 mg by mouth daily.    simethicone (MYLICON) 80 MG chewable tablet Chew 80 mg by mouth every 6 (six) hours as needed for flatulence.    sitaGLIPtin (JANUVIA) 50 MG tablet Take 50 mg by mouth daily.    timolol (BETIMOL) 0.25 % ophthalmic solution Place 1 drop into both eyes 2 (two) times daily.    GLIPIZIDE XL 5 MG 24 hr tablet TAKE 1 TABLET DAILY Qty: 90 tablet, Refills: 4      STOP taking these medications     bisacodyl (DULCOLAX) 10 MG suppository      cephALEXin (KEFLEX) 500 MG capsule      diltiazem (CARDIZEM) 120 MG tablet      pantoprazole (PROTONIX) 40 MG tablet      polyethylene glycol powder (GLYCOLAX/MIRALAX) powder      SENNA PO      hydrocortisone 2.5 % cream      MULTIPLE VITAMIN PO          Today    VITAL SIGNS:  Blood pressure 143/79, pulse 78, temperature 98.2 F (36.8 C), temperature source Oral, resp. rate 18, height  (1.6 m), weight 86.002 kg (189 lb 9.6 oz), SpO2 98 %.  I/O:   Intake/Output Summary (Last 24 hours) at 07/13/15 1110 Last data filed at 07/12/15 1650  Gross per 24 hour  Intake      0 ml  Output    550 ml   Net   -550 ml    PHYSICAL EXAMINATION:  Physical Exam  GENERAL:  81 y.o.-year-old patient lying in the bed with no acute distress.  LUNGS: Normal breath sounds bilaterally, no wheezing, rales,rhonchi or crepitation. No use of accessory muscles of respiration.  CARDIOVASCULAR: S1, S2 normal. No murmurs, rubs, or gallops. 1+ edema ABDOMEN: Soft, non-tender, non-distended. Bowel sounds present. No organomegaly or mass.  NEUROLOGIC: Moves all 4 extremities. PSYCHIATRIC: The patient is alert and oriented x 3.  SKIN: No obvious rash, lesion, or ulcer.   DATA REVIEW:   CBC  Recent Labs Lab 07/12/15 0516  WBC 9.8  HGB 8.5*  HCT 25.8*  PLT 211    Chemistries   Recent Labs Lab 07/09/15 1439 07/10/15 0433 07/12/15 0516  NA  --  137 136  K 3.7 3.1* 3.5  CL  --  96* 95*  CO2  --  34* 32  GLUCOSE  --  79 164*  BUN  --  23* 26*  CREATININE  --  0.74 0.81  CALCIUM  --  8.4* 8.7*  MG 1.9  --   --   AST  --  26  --   ALT  --  23  --   ALKPHOS  --  71  --   BILITOT  --  0.7  --     Cardiac Enzymes No results for input(s): TROPONINI in the last 168 hours.  Microbiology Results  Results for orders placed or performed during the hospital encounter of 06/21/15  Stool culture     Status: None   Collection Time: 06/21/15  5:17 PM  Result Value Ref Range Status   Specimen Description STOOL  Final   Special Requests Normal  Final   Culture   Final    NO SALMONELLA OR SHIGELLA ISOLATED No Pathogenic E. coli detected NO CAMPYLOBACTER DETECTED    Report Status 06/23/2015 FINAL  Final  C difficile quick scan w PCR reflex ([facility] only)     Status: Abnormal   Collection Time: 06/21/15  5:17 PM  Result Value Ref Range Status   C Diff antigen POSITIVE (A) NEGATIVE Final   C Diff toxin POSITIVE (A) NEGATIVE Final   C Diff interpretation   Final    Positive for toxigenic C. difficile, active toxin production present.    Comment: CRITICAL RESULT CALLED TO, READ BACK BY AND VERIFIED  WITH: DONALD SWEENEY ON 06/21/15 AT 1755 BY JEF   MRSA PCR Screening     Status: None   Collection Time: 06/25/15 10:48 AM  Result Value Ref Range Status   MRSA by PCR NEGATIVE NEGATIVE Final    Comment:        The GeneXpert MRSA Assay (FDA approved for NASAL specimens only), is one component of a comprehensive MRSA colonization surveillance program. It is not intended to diagnose MRSA infection nor to guide or monitor treatment for MRSA infections.   Urine culture     Status: None   Collection Time: 07/06/15 11:38 AM  Result Value Ref Range Status   Specimen Description URINE, RANDOM  Final   Special Requests NONE  Final   Culture 30,000 COLONIES/mL PROTEUS MIRABILIS  Final   Report Status 07/08/2015 FINAL  Final   Organism ID, Bacteria PROTEUS MIRABILIS  Final      Susceptibility   Proteus mirabilis - MIC*    AMPICILLIN <=2 SENSITIVE Sensitive     CEFTAZIDIME <=1 SENSITIVE Sensitive     CEFAZOLIN <=4 SENSITIVE Sensitive     CEFTRIAXONE <=1 SENSITIVE Sensitive     CIPROFLOXACIN <=0.25 SENSITIVE Sensitive     GENTAMICIN <=1 SENSITIVE Sensitive     IMIPENEM 2 SENSITIVE Sensitive     TRIMETH/SULFA <=20 SENSITIVE Sensitive     NITROFURANTOIN Value in next row Resistant      RESISTANT128    PIP/TAZO Value in next row Sensitive      SENSITIVE<=4    AMPICILLIN/SULBACTAM Value in next row Sensitive      SENSITIVE<=2    * 30,000 COLONIES/mL PROTEUS MIRABILIS  Culture, blood (routine x 2)     Status: None   Collection Time: 07/08/15  8:27 AM  Result Value Ref Range Status   Specimen Description BLOOD RIGHT HAND  Final   Special Requests BOTTLES DRAWN AEROBIC AND ANAEROBIC 10CC  Final   Culture NO GROWTH 5 DAYS  Final   Report Status 07/13/2015 FINAL  Final  Culture, blood (routine x 2)     Status: None   Collection Time: 07/08/15  8:34 AM  Result Value Ref Range Status   Specimen Description BLOOD RIGHT HAND  Final   Special Requests BOTTLES DRAWN AEROBIC AND ANAEROBIC 7CC   Final   Culture NO GROWTH 5 DAYS  Final   Report Status 07/13/2015 FINAL  Final    RADIOLOGY:  No results found.    Follow up with PCP in 1 week.  Management plans discussed with the patient, family and they are in agreement.  CODE STATUS:     Code Status Orders        Start     Ordered   06/21/15 2044  Full code   Continuous     06/21/15 2044    Advance Directive Documentation        Most Recent Value   Type of Advance Directive  Living will   Pre-existing out of facility DNR order (yellow form or pink MOST  form)     "MOST" Form in Place?        TOTAL TIME TAKING CARE OF THIS PATIENT ON DAY OF DISCHARGE: more than 30 minutes.    Milagros Loll R M.D on 07/13/2015 at 11:10 AM  Between 7am to 6pm - Pager - [phone]  After 6pm go to [email or web address] - password [email or web address] [facility]  [place] [facility]  Office  [phone]  CC: Primary care physician; Mila Merry, MD

## 2015-07-13 NOTE — ED Provider Notes (Signed)
[facility] Emergency Department Provider Note   ____________________________________________  Time seen: On EMS arrival  I have reviewed the triage vital signs and the nursing notes.   HISTORY  Chief Complaint No chief complaint on file.   History limited by: poor historian. Some historian obtained from previous chart   HPI Katherine [name] is a 81 y.o. female who presents to the emergency department via EMS today because of concerns for rectal bleeding. Patient was just discharged from the hospital roughly 2 hours ago. Patient had a prolonged stay in the hospital secondary to C. difficile colitis as well as gastrointestinal bleeding requiring a transfusion. It appears that they thought the GI bleeding was secondary to hemorrhoids. Additionally whilst the patient was in the hospital she was treated for urinary tract infection. Patient herself is complaining of some abdominal discomfort. Blood work from yesterday with hemoglobin of 8.5. No recent fevers.   Past Medical History  Diagnosis Date  . Diabetes mellitus without complication   . Hypertension   . CCF (congestive cardiac failure) 06/04/2015  . Varicose veins     of leg  . Bowel habit changes   . Anemia   . Diabetes mellitus with renal manifestation 03/19/2009  . Vitamin D deficiency   . Hyperglyceridemia, pure   . Hyperlipidemia 02/01/2010  . Hyponatremia   . Mitral valve disorder 04/01/2009  . Atherosclerosis of coronary artery 08/04/2009  . CHF (congestive heart failure)     EF 30-35% on echo 2014  . Allergy 04/04/2009  . GERD (gastroesophageal reflux disease) 11/06/2009  . Hemorrhoids, external without complications 08/04/2009  . Arthritis   . Arthralgia   . Myalgia   . Palpitations   . Chronic nausea   . Malaise and fatigue   . Edema   . Atrial fibrillation 04/01/2009  . Dysuria     Patient Active Problem List   Diagnosis Date Noted  . Hypokalemia 07/13/2015  . UTI (urinary  tract infection) 07/13/2015  . Pneumonia   . Hypoglycemia associated with diabetes 06/26/2015  . Essential hypertension 06/26/2015  . Hyponatremia 06/25/2015  . Acute respiratory failure 06/25/2015  . Pleural effusion 06/25/2015  . Atelectasis 06/25/2015  . Sepsis 06/25/2015  . Fall   . Ileus   . C. difficile colitis 06/21/2015  . Ache in joint 06/04/2015  . Arthritis 06/04/2015  . CCF (congestive cardiac failure) 06/04/2015  . Feeling bilious 06/04/2015  . Arteriosclerosis of coronary artery 06/04/2015  . Difficult or painful urination 06/04/2015  . Dermatitis, eczematoid 06/04/2015  . Essential (primary) hypertension 06/04/2015  . Fatigue 06/04/2015  . Hyperglyceridemia, pure 06/04/2015  . Hypertriglyceridemia 06/04/2015  . Below normal amount of sodium in the blood 06/04/2015  . Muscle ache 06/04/2015  . Awareness of heartbeats 06/04/2015  . Asymptomatic varicose veins 06/04/2015  . Avitaminosis D 06/04/2015  . Diabetes mellitus, type 2 05/05/2015  . A-fib 05/04/2015  . Glaucoma 05/04/2015  . Acute non-ST segment elevation myocardial infarction 05/04/2015  . Edema leg 01/15/2015  . AF (paroxysmal atrial fibrillation) 01/15/2015  . Malaise and fatigue 05/25/2010  . HLD (hyperlipidemia) 02/01/2010  . Acid reflux 11/06/2009  . Absolute anemia 10/09/2009  . Atherosclerosis of coronary artery 08/04/2009  . Hemorrhoids without complication 08/04/2009  . Allergic rhinitis 04/04/2009  . Disorder of mitral valve 04/01/2009  . Diabetes 03/19/2009    Past Surgical History  Procedure Laterality Date  . Tonsillectomy    . Breast surgery    . Abdominal hysterectomy    .  Appendectomy    . Hernia repair    . Cholecystectomy    . Stents  2011    Current Outpatient Rx  Name  Route  Sig  Dispense  Refill  . acidophilus (RISAQUAD) CAPS capsule   Oral   Take 1 capsule by mouth 2 (two) times daily.   60 capsule   0   . aspirin 81 MG tablet   Oral   Take 81 mg by mouth  daily.         Marland Kitchen atorvastatin (LIPITOR) 80 MG tablet   Oral   Take 80 mg by mouth every evening.         . benazepril (LOTENSIN) 40 MG tablet   Oral   Take 1 tablet by mouth daily.          . carvedilol (COREG) 25 MG tablet   Oral   Take 25 mg by mouth 2 (two) times daily.         . cloNIDine (CATAPRES) 0.1 MG tablet   Oral   Take 1 tablet (0.1 mg total) by mouth 3 (three) times daily.   60 tablet   11   . digoxin (LANOXIN) 0.25 MG tablet   Oral   Take 1 tablet (0.25 mg total) by mouth daily.         Marland Kitchen diltiazem (CARDIZEM CD) 240 MG 24 hr capsule   Oral   Take 1 capsule (240 mg total) by mouth daily.         . dorzolamide (TRUSOPT) 2 % ophthalmic solution   Both Eyes   Place 1 drop into both eyes 3 (three) times daily.         . famotidine (PEPCID) 20 MG tablet   Oral   Take 1 tablet (20 mg total) by mouth daily.         . feeding supplement, GLUCERNA SHAKE, (GLUCERNA SHAKE) LIQD   Oral   Take 237 mLs by mouth 2 (two) times daily between meals.      0   . furosemide (LASIX) 20 MG tablet   Oral   Take 20 mg by mouth 2 (two) times daily.          Marland Kitchen GLIPIZIDE XL 5 MG 24 hr tablet      TAKE 1 TABLET DAILY   90 tablet   4   . hydrALAZINE (APRESOLINE) 100 MG tablet   Oral   Take 100 mg by mouth 3 (three) times daily.         . hydrocortisone cream 1 %   Topical   Apply 1 application topically every 12 (twelve) hours as needed for itching (rash).         . hyoscyamine (LEVSIN, ANASPAZ) 0.125 MG tablet   Oral   Take 0.125 mg by mouth daily.          . insulin glargine (LANTUS) 100 UNIT/ML injection   Subcutaneous   Inject 0.26 mLs (26 Units total) into the skin at bedtime.   10 mL   11   . latanoprost (XALATAN) 0.005 % ophthalmic solution   Both Eyes   Place 1 drop into both eyes at bedtime.          . metoCLOPramide (REGLAN) 5 MG tablet   Oral   Take 5 mg by mouth 4 (four) times daily -  before meals and at bedtime.          . potassium chloride (KLOR-CON M10) 10 MEQ tablet  Oral   Take 10 mEq by mouth daily.         Marland Kitchen saccharomyces boulardii (FLORASTOR) 250 MG capsule   Oral   Take 1 capsule (250 mg total) by mouth 2 (two) times daily.         . sertraline (ZOLOFT) 50 MG tablet   Oral   Take 50 mg by mouth daily.         . simethicone (MYLICON) 80 MG chewable tablet   Oral   Chew 80 mg by mouth every 6 (six) hours as needed for flatulence.         . sitaGLIPtin (JANUVIA) 50 MG tablet   Oral   Take 50 mg by mouth daily.         Marland Kitchen spironolactone (ALDACTONE) 25 MG tablet   Oral   Take 1 tablet (25 mg total) by mouth daily.         . timolol (BETIMOL) 0.25 % ophthalmic solution   Both Eyes   Place 1 drop into both eyes 2 (two) times daily.         . vancomycin (VANCOCIN) 50 mg/mL oral solution   Oral   Take 5 mLs (250 mg total) by mouth every 6 (six) hours.   140 mL   0     Allergies Benzocaine; Contrast media; Other; Penicillin v potassium; Penicillins; and Sulfa antibiotics  Family History  Problem Relation Age of Onset  . Heart attack Mother   . Cancer Father     Social History Social History  Substance Use Topics  . Smoking status: Never Smoker   . Smokeless tobacco: Not on file  . Alcohol Use: No    Review of Systems  Constitutional: Negative for fever. Cardiovascular: Negative for chest pain. Respiratory: Negative for shortness of breath. Gastrointestinal: Positive for abdominal pain, diarrhea Genitourinary: Negative for dysuria. Musculoskeletal: Negative for back pain. Skin: Negative for rash. Neurological: Negative for headaches, focal weakness or numbness.  10-point ROS otherwise negative.  ____________________________________________   PHYSICAL EXAM:  VITAL SIGNS:   98.2 F (36.8 C)  97  --   173/78 mmHg  100 %       Constitutional: Alert and oriented.  Eyes: Conjunctivae are normal. PERRL. Normal extraocular movements. ENT    Head: Normocephalic and atraumatic.   Nose: No congestion/rhinnorhea.   Mouth/Throat: Mucous membranes are moist.   Neck: No stridor. Hematological/Lymphatic/Immunilogical: No cervical lymphadenopathy. Cardiovascular: Normal rate, regular rhythm.  No murmurs, rubs, or gallops. Respiratory: Normal respiratory effort without tachypnea nor retractions. Breath sounds are clear and equal bilaterally. No wheezes/rales/rhonchi. Gastrointestinal: Soft and nontender. No distention. Ecchymosis of her abdominal wall likely secondary to medication administration. Abdomen is nontender. Soft. Perirectal area with some breakdown. Multiple external hemorrhoids. Gross blood coming from rectum. Genitourinary: Deferred Musculoskeletal: Normal range of motion in all extremities. No joint effusions.  No lower extremity tenderness nor edema. Neurologic:  Normal speech and language. No gross focal neurologic deficits are appreciated. Speech is normal.  Skin:  Skin is warm, dry and intact. No rash noted. Psychiatric: Mood and affect are normal. Speech and behavior are normal. Patient exhibits appropriate insight and judgment.  ____________________________________________    LABS (pertinent positives/negatives)  Labs Reviewed  CBC WITH DIFFERENTIAL/PLATELET - Abnormal; Notable for the following:    RBC 2.97 (*)    Hemoglobin 8.6 (*)    HCT 26.6 (*)    RDW 19.3 (*)    Neutro Abs 8.3 (*)    All other components  within normal limits  PROTIME-INR - Abnormal; Notable for the following:    Prothrombin Time 22.7 (*)    All other components within normal limits  BASIC METABOLIC PANEL - Abnormal; Notable for the following:    Chloride 96 (*)    Glucose, Bld 205 (*)    BUN 28 (*)    All other components within normal limits  URINALYSIS COMPLETEWITH MICROSCOPIC ([facility] ONLY) - Abnormal; Notable for the following:    Color, Urine YELLOW (*)    APPearance CLEAR (*)    Glucose, UA 50 (*)    Hgb urine  dipstick 1+ (*)    Protein, ur >500 (*)    Leukocytes, UA 2+ (*)    Squamous Epithelial / LPF 0-5 (*)    All other components within normal limits  HEMOGLOBIN  HEMOGLOBIN  HEMOGLOBIN  BASIC METABOLIC PANEL  CBC  TYPE AND SCREEN  PREPARE RBC (CROSSMATCH)     ____________________________________________   EKG  None  ____________________________________________    RADIOLOGY  None  ____________________________________________   PROCEDURES  Procedure(s) performed: None  Critical Care performed: No  ____________________________________________   INITIAL IMPRESSION / ASSESSMENT AND PLAN / ED COURSE  Pertinent labs & imaging results that were available during my care of the patient were reviewed by me and considered in my medical decision making (see chart for details).  Patient presents to the emergency department today from nursing facility because of concerns for GI bleed. Patient was just discharged from the hospital couple of hours prior. On chart review patient had an admission for C. difficile during which she did have GI bleeds requiring transfusion. This point patient does have blood coming out of her rectum. She does have some external hemorrhoids but no obvious external hemorrhoid bleeding. Given the active bleeding and already low hemoglobin from previous admission will plan on admitting to the hospital.  ____________________________________________   FINAL CLINICAL IMPRESSION(S) / ED DIAGNOSES  Final diagnoses:  Gastrointestinal hemorrhage, unspecified gastritis, unspecified gastrointestinal hemorrhage type     Phineas Semen, MD 07/13/15 1943

## 2015-07-13 NOTE — Progress Notes (Signed)
Pt admit from ED with GI bleed. Pale, alert, confused to date.  But otherwise oriented.  States she wants blood products if needed.  Permission for transfusion obtained. Skin dry. Upon transfer to bed incontinent of large amount bright red blood with clots from rectum. See assessment on flow sheet. Preparing to give one unit blood and send to nuclear med for bleeding scan

## 2015-07-13 NOTE — Clinical Social Work Placement (Signed)
   CLINICAL SOCIAL WORK PLACEMENT  NOTE  Date:  07/13/2015  Patient Details  Name: Katherine [name] MRN: [id number] Date of Birth: Apr 05, 1932  Clinical Social Work is seeking post-discharge placement for this patient at the Skilled  Nursing Facility level of care (*CSW will initial, date and re-position this form in  chart as items are completed):  Yes   Patient/family provided with [facility] Clinical Social Work Department's list of facilities offering this level of care within the geographic area requested by the patient (or if unable, by the patient's family).  Yes   Patient/family informed of their freedom to choose among providers that offer the needed level of care, that participate in Medicare, Medicaid or managed care program needed by the patient, have an available bed and are willing to accept the patient.  Yes   Patient/family informed of [facility]'s ownership interest in [facility] and [facility], as well as of the fact that they are under no obligation to receive care at these facilities.  PASRR submitted to EDS on       PASRR number received on       Existing PASRR number confirmed on 06/23/15     FL2 transmitted to all facilities in geographic area requested by pt/family on 06/23/15     FL2 transmitted to all facilities within larger geographic area on       Patient informed that his/her managed care company has contracts with or will negotiate with certain facilities, including the following:        Yes   Patient/family informed of bed offers received.  Patient chooses bed at  [facility])     Physician recommends and patient chooses bed at  [facility])    Patient to be transferred to  [facility]) on 07/13/15.  Patient to be transferred to facility by  (EMS)     Patient family notified on   of transfer.  Name of family member notified:   (Renee)     PHYSICIAN Please sign FL2     Additional Comment:     _______________________________________________ York Spaniel, LCSW 07/13/2015, 12:16 PM

## 2015-07-13 NOTE — ED Notes (Signed)
Patient just discharged from [facility] today to [facility]. Was sent back due to rectal bleeding

## 2015-07-13 NOTE — Progress Notes (Signed)
Pt resting with eyes closed in bed while Nuc Med test in progress.  VSS.  RN at bedside to monitor.

## 2015-07-13 NOTE — Clinical Social Work Note (Signed)
Patient to discharge to [facility] today. Patient's daughter in law, Luster Landsberg, is here and aware and in agreement and is going to let additional family be aware. Patient to transport via EMS and is on isolation precautions for CDIFF. Discharge summary sent to [facility]. Patient's nurse is aware.  York Spaniel MSW,LCSW [phone]

## 2015-07-13 NOTE — H&P (Signed)
[facility] - [place] at [facility]   PATIENT NAME: Katherine [name]    MR#:  [id number]  DATE OF BIRTH:  08-24-1932  DATE OF ADMISSION:  07/13/2015  PRIMARY CARE PHYSICIAN: Mila Merry, MD   REQUESTING/REFERRING PHYSICIAN: Dr. Derrill Kay  CHIEF COMPLAINT:   Chief Complaint  Patient presents with  . Rectal Bleeding    HISTORY OF PRESENT ILLNESS:  Katherine [name]  is a 81 y.o. female with a known history of hypertension, Afib, CAD, recent C. difficile infection who was discharged to skilled nursing facility earlier today return to the emergency room due to acute rectal bleeding. Patient does have history of hemorrhoids. She had a prolonged stay of 21 days in the hospital. Did not have any acute blood loss. Although she does have chronic blood loss anemia and was transfused 1 unit of packed RBC during her hospital stay. Hemoglobin has been stable during the hospital stay. Today she has significant amount of rectal bleeding. Tachycardic with blood pressure normal. Hemoglobin was repeated and is normal at 8.6 which seems to be her baseline at this time. Patient is being admitted as she continues to have significant amount of rectal bleeding. She is critically ill. No abdominal pain. Has not had any diarrhea since discharge. Afebrile. She is not on any anticoagulation. Patient was getting DVT prophylaxis during her hospital stay.  PAST MEDICAL HISTORY:   Past Medical History  Diagnosis Date  . Diabetes mellitus without complication   . Hypertension   . CCF (congestive cardiac failure) 06/04/2015  . Varicose veins     of leg  . Bowel habit changes   . Anemia   . Diabetes mellitus with renal manifestation 03/19/2009  . Vitamin D deficiency   . Hyperglyceridemia, pure   . Hyperlipidemia 02/01/2010  . Hyponatremia   . Mitral valve disorder 04/01/2009  . Atherosclerosis of coronary artery 08/04/2009  . CHF (congestive heart failure)     EF 30-35% on echo 2014   . Allergy 04/04/2009  . GERD (gastroesophageal reflux disease) 11/06/2009  . Hemorrhoids, external without complications 08/04/2009  . Arthritis   . Arthralgia   . Myalgia   . Palpitations   . Chronic nausea   . Malaise and fatigue   . Edema   . Atrial fibrillation 04/01/2009  . Dysuria     PAST SURGICAL HISTORY:   Past Surgical History  Procedure Laterality Date  . Tonsillectomy    . Breast surgery    . Abdominal hysterectomy    . Appendectomy    . Hernia repair    . Cholecystectomy    . Stents  2011    SOCIAL HISTORY:   Social History  Substance Use Topics  . Smoking status: Never Smoker   . Smokeless tobacco: Not on file  . Alcohol Use: No    FAMILY HISTORY:   Family History  Problem Relation Age of Onset  . Heart attack Mother   . Cancer Father     DRUG ALLERGIES:   Allergies  Allergen Reactions  . Benzocaine Other (See Comments)    Reaction:  Unknown   . Ivp Dye [Iodinated Diagnostic Agents] Other (See Comments)    Reaction:  Tachycardia/SVT  . Penicillins Other (See Comments)    Reaction:  Unknown   . Sulfa Antibiotics Other (See Comments)    Reaction:  Unknown     REVIEW OF SYSTEMS:   Review of Systems  Constitutional: Positive for malaise/fatigue. Negative for fever, chills and weight loss.  HENT: Negative for hearing loss and nosebleeds.   Eyes: Negative for blurred vision, double vision and pain.  Respiratory: Negative for cough, hemoptysis, sputum production, shortness of breath and wheezing.   Cardiovascular: Negative for chest pain, palpitations, orthopnea and leg swelling.  Gastrointestinal: Positive for diarrhea and blood in stool. Negative for nausea, vomiting, abdominal pain and constipation.  Genitourinary: Negative for dysuria and hematuria.  Musculoskeletal: Negative for myalgias, back pain and falls.  Skin: Negative for rash.  Neurological: Positive for dizziness and weakness. Negative for tremors, sensory change, speech  change, focal weakness, seizures and headaches.  Endo/Heme/Allergies: Bruises/bleeds easily.  Psychiatric/Behavioral: Negative for depression and memory loss. The patient is not nervous/anxious.     MEDICATIONS AT HOME:   Prior to Admission medications   Medication Sig Start Date End Date Taking? Authorizing Provider  acidophilus (RISAQUAD) CAPS capsule Take 1 capsule by mouth 2 (two) times daily. 07/13/15  Yes Milagros Loll, MD  aspirin EC 81 MG tablet Take 81 mg by mouth daily.   Yes Historical Provider, MD  atorvastatin (LIPITOR) 80 MG tablet Take 80 mg by mouth at bedtime.    Yes Historical Provider, MD  benazepril (LOTENSIN) 40 MG tablet Take 40 mg by mouth daily.   Yes Historical Provider, MD  carvedilol (COREG) 25 MG tablet Take 25 mg by mouth 2 (two) times daily.   Yes Historical Provider, MD  cloNIDine (CATAPRES) 0.1 MG tablet Take 1 tablet (0.1 mg total) by mouth 3 (three) times daily. 07/13/15  Yes Conley Delisle, MD  digoxin (LANOXIN) 0.25 MG tablet Take 1 tablet (0.25 mg total) by mouth daily. 07/13/15  Yes Beadie Matsunaga, MD  diltiazem (CARDIZEM CD) 240 MG 24 hr capsule Take 1 capsule (240 mg total) by mouth daily. 07/13/15  Yes Jaysha Lasure, MD  dorzolamide (TRUSOPT) 2 % ophthalmic solution Place 1 drop into both eyes 3 (three) times daily.   Yes Historical Provider, MD  famotidine (PEPCID) 20 MG tablet Take 1 tablet (20 mg total) by mouth daily. 07/13/15  Yes Quentin Strebel, MD  feeding supplement, GLUCERNA SHAKE, (GLUCERNA SHAKE) LIQD Take 237 mLs by mouth 2 (two) times daily between meals. 07/13/15  Yes Thalya Fouche, MD  furosemide (LASIX) 20 MG tablet Take 20 mg by mouth 2 (two) times daily.   Yes Historical Provider, MD  glipiZIDE (GLUCOTROL XL) 5 MG 24 hr tablet Take 5 mg by mouth daily.   Yes Historical Provider, MD  hydrALAZINE (APRESOLINE) 100 MG tablet Take 100 mg by mouth 3 (three) times daily.   Yes Historical Provider, MD  hydrocortisone cream 1 % Apply 1 application  topically every 12 (twelve) hours as needed for itching (and rash).    Yes Historical Provider, MD  hyoscyamine (LEVSIN, ANASPAZ) 0.125 MG tablet Take 0.125 mg by mouth daily.   Yes Historical Provider, MD  insulin glargine (LANTUS) 100 UNIT/ML injection Inject 0.26 mLs (26 Units total) into the skin at bedtime. 07/13/15  Yes Corydon Schweiss, MD  latanoprost (XALATAN) 0.005 % ophthalmic solution Place 1 drop into both eyes at bedtime.   Yes Historical Provider, MD  metoCLOPramide (REGLAN) 5 MG tablet Take 5 mg by mouth 4 (four) times daily -  before meals and at bedtime.   Yes Historical Provider, MD  potassium chloride (KLOR-CON M10) 10 MEQ tablet Take 10 mEq by mouth daily.   Yes Historical Provider, MD  saccharomyces boulardii (FLORASTOR) 250 MG capsule Take 1 capsule (250 mg total) by mouth 2 (two) times daily. 07/13/15  Yes Milagros Loll, MD  sertraline (ZOLOFT) 50 MG tablet Take 50 mg by mouth daily.   Yes Historical Provider, MD  simethicone (MYLICON) 80 MG chewable tablet Chew 80 mg by mouth every 6 (six) hours as needed for flatulence.   Yes Historical Provider, MD  sitaGLIPtin (JANUVIA) 50 MG tablet Take 50 mg by mouth daily.   Yes Historical Provider, MD  spironolactone (ALDACTONE) 25 MG tablet Take 1 tablet (25 mg total) by mouth daily. 07/13/15  Yes Aland Chestnutt, MD  timolol (BETIMOL) 0.25 % ophthalmic solution Place 1 drop into both eyes 2 (two) times daily.   Yes Historical Provider, MD  vancomycin (VANCOCIN) 50 mg/mL oral solution Take 5 mLs (250 mg total) by mouth every 6 (six) hours. 07/13/15  Yes Glendal Cassaday, MD      VITAL SIGNS:  Blood pressure 173/78, pulse 97, temperature 98.2 F (36.8 C), temperature source Oral, height  (1.6 m), weight 86.183 kg (190 lb), SpO2 100 %.  PHYSICAL EXAMINATION:  Physical Exam  GENERAL:  81 y.o.-year-old patient lying in the bed with no acute distress. Pale EYES: Pupils equal, round, reactive to light and accommodation. No scleral icterus.  Extraocular muscles intact.  HEENT: Head atraumatic, normocephalic. Oropharynx and nasopharynx clear. No oropharyngeal erythema, moist oral mucosa  NECK:  Supple, no jugular venous distention. No thyroid enlargement, no tenderness.  LUNGS: Normal breath sounds bilaterally, no wheezing, rales, rhonchi. No use of accessory muscles of respiration.  CARDIOVASCULAR: S1, S2 normal. No murmurs, rubs, or gallops.  ABDOMEN: Soft, nontender, nondistended. Bowel sounds present. No organomegaly or mass.  EXTREMITIES: No pedal edema, cyanosis, or clubbing. + 2 pedal & radial pulses b/l.   NEUROLOGIC: Cranial nerves II through XII are intact. No focal Motor or sensory deficits appreciated b/l PSYCHIATRIC: The patient is alert and oriented x 3. Good affect.  SKIN: No obvious rash, lesion, or ulcer.   LABORATORY PANEL:   CBC  Recent Labs Lab 07/13/15 1534  WBC 10.4  HGB 8.6*  HCT 26.6*  PLT 236   ------------------------------------------------------------------------------------------------------------------  Chemistries   Recent Labs Lab 07/09/15 1439 07/10/15 0433  07/13/15 1534  NA  --  137  < > 135  K 3.7 3.1*  < > 4.4  CL  --  96*  < > 96*  CO2  --  34*  < > 29  GLUCOSE  --  79  < > 205*  BUN  --  23*  < > 28*  CREATININE  --  0.74  < > 0.80  CALCIUM  --  8.4*  < > 8.9  MG 1.9  --   --   --   AST  --  26  --   --   ALT  --  23  --   --   ALKPHOS  --  71  --   --   BILITOT  --  0.7  --   --   < > = values in this interval not displayed. ------------------------------------------------------------------------------------------------------------------  Cardiac Enzymes No results for input(s): TROPONINI in the last 168 hours. ------------------------------------------------------------------------------------------------------------------  RADIOLOGY:  No results found.   IMPRESSION AND PLAN:   * Acute rectal bleeding. Likely lower GI bleed. Cannot rule out upper GI  bleed. Patient does have history of hemorrhoids but she is having significant amount of blood. We will get a stat bleeding scan. Admit patient to ICU. Check every 6 hours hemoglobin. Also started on IV Protonix twice a day. Discussed case with Dr.  oh of GI to see the patient. If the bleeding scan is positive she will need a vascular consult. Patient is critically ill with high risk for cardiac arrest and death. Family at bedside and patient understand this. She is tachycardic although not hypotensive. Her hemoglobin is stable on repeat blood work but would likely drop soon. Type and cross. Transfuse 1 unit of packed RBC now.  * C. difficile colitis Patient will be continued on vancomycin.  * chronic diastolic CHF with anasarca Much improved  * Hypertension continue medications  * DVT prophylaxis with SCDs   All the records are reviewed and case discussed with ED provider. Management plans discussed with the patient, family and they are in agreement.  CODE STATUS: FULL CODE  TOTAL TIME TAKING CARE OF THIS PATIENT: 45 minutes.    Milagros Loll R M.D on 07/13/2015 at 4:42 PM  Between 7am to 6pm - Pager - [phone]  After 6pm go to [email or web address] - password [email or web address] [facility]  [place] [facility]  Office  [phone]  CC: Primary care physician; Mila Merry, MD

## 2015-07-14 ENCOUNTER — Encounter: Payer: Self-pay | Admitting: Gastroenterology

## 2015-07-14 DIAGNOSIS — E43 Unspecified severe protein-calorie malnutrition: Secondary | ICD-10-CM | POA: Diagnosis present

## 2015-07-14 DIAGNOSIS — L8990 Pressure ulcer of unspecified site, unspecified stage: Secondary | ICD-10-CM | POA: Insufficient documentation

## 2015-07-14 LAB — BASIC METABOLIC PANEL
Anion gap: 9 (ref 5–15)
BUN: 30 mg/dL — AB (ref 6–20)
CHLORIDE: 100 mmol/L — AB (ref 101–111)
CO2: 28 mmol/L (ref 22–32)
Calcium: 8.4 mg/dL — ABNORMAL LOW (ref 8.9–10.3)
Creatinine, Ser: 0.84 mg/dL (ref 0.44–1.00)
GFR calc Af Amer: >60 mL/min (ref >60)
GFR calc non Af Amer: >60 mL/min (ref >60)
GLUCOSE: 202 mg/dL — AB (ref 65–99)
POTASSIUM: 3.9 mmol/L (ref 3.5–5.1)
Sodium: 137 mmol/L (ref 135–145)

## 2015-07-14 LAB — CBC
HEMATOCRIT: 27.2 % — AB (ref 35.0–47.0)
Hemoglobin: 8.8 g/dL — ABNORMAL LOW (ref 12.0–16.0)
MCH: 28.2 pg (ref 26.0–34.0)
MCHC: 32.2 g/dL (ref 32.0–36.0)
MCV: 87.6 fL (ref 80.0–100.0)
Platelets: 210 10*3/uL (ref 150–440)
RBC: 3.11 MIL/uL — ABNORMAL LOW (ref 3.80–5.20)
RDW: 19.2 % — AB (ref 11.5–14.5)
WBC: 10.2 10*3/uL (ref 3.6–11.0)

## 2015-07-14 LAB — GLUCOSE, CAPILLARY
GLUCOSE-CAPILLARY: 324 mg/dL — AB (ref 65–99)
GLUCOSE-CAPILLARY: 301 mg/dL — AB (ref 65–99)
Glucose-Capillary: 188 mg/dL — ABNORMAL HIGH (ref 65–99)

## 2015-07-14 LAB — HEMOGLOBIN
Hemoglobin: 8.6 g/dL — ABNORMAL LOW (ref 12.0–16.0)
Hemoglobin: 9 g/dL — ABNORMAL LOW (ref 12.0–16.0)

## 2015-07-14 MED ORDER — INSULIN ASPART 100 UNIT/ML ~~LOC~~ SOLN
0.0000 [IU] | Freq: Three times a day (TID) | SUBCUTANEOUS | Status: DC
  Administered 2015-07-14: 7 [IU] via SUBCUTANEOUS
  Administered 2015-07-15 – 2015-07-16 (×3): 5 [IU] via SUBCUTANEOUS
  Administered 2015-07-16: 2 [IU] via SUBCUTANEOUS
  Administered 2015-07-16: 9 [IU] via SUBCUTANEOUS
  Administered 2015-07-17 (×2): 3 [IU] via SUBCUTANEOUS
  Filled 2015-07-14: qty 9
  Filled 2015-07-14: qty 5
  Filled 2015-07-14: qty 2
  Filled 2015-07-14: qty 3
  Filled 2015-07-14: qty 6
  Filled 2015-07-14: qty 5
  Filled 2015-07-14: qty 3
  Filled 2015-07-14: qty 5
  Filled 2015-07-14: qty 1

## 2015-07-14 MED ORDER — HYDROCORTISONE 1 % EX CREA
TOPICAL_CREAM | Freq: Two times a day (BID) | CUTANEOUS | Status: DC
  Administered 2015-07-14: 12:00:00 via TOPICAL
  Administered 2015-07-14: 1 via TOPICAL
  Administered 2015-07-15 – 2015-07-17 (×5): via TOPICAL
  Filled 2015-07-14: qty 28

## 2015-07-14 MED ORDER — GLUCERNA SHAKE PO LIQD
237.0000 mL | Freq: Three times a day (TID) | ORAL | Status: DC
  Administered 2015-07-14 – 2015-07-17 (×9): 237 mL via ORAL

## 2015-07-14 MED ORDER — HYDROCORTISONE ACETATE 25 MG RE SUPP
25.0000 mg | Freq: Two times a day (BID) | RECTAL | Status: DC
  Administered 2015-07-14 – 2015-07-17 (×7): 25 mg via RECTAL
  Filled 2015-07-14 (×6): qty 1

## 2015-07-14 MED ORDER — INSULIN GLARGINE 100 UNIT/ML ~~LOC~~ SOLN
20.0000 [IU] | Freq: Every day | SUBCUTANEOUS | Status: DC
Start: 2015-07-15 — End: 2015-07-16
  Administered 2015-07-15 – 2015-07-16 (×2): 20 [IU] via SUBCUTANEOUS
  Filled 2015-07-14 (×3): qty 0.2

## 2015-07-14 MED ORDER — INSULIN ASPART 100 UNIT/ML ~~LOC~~ SOLN: 0.0000 [IU] | Freq: Three times a day (TID) | SUBCUTANEOUS | Status: DC

## 2015-07-14 MED ORDER — ENSURE ENLIVE PO LIQD
237.0000 mL | Freq: Every day | ORAL | Status: DC
  Administered 2015-07-15 – 2015-07-16 (×2): 237 mL via ORAL

## 2015-07-14 NOTE — Consult Note (Signed)
WOC wound consult note Reason for Consult: Incontinence Associated Dermatitis, acute rectal bleeding.  Diarrhea has slowed down a bit but recent C. Difficile infection on previous admission.  Wound type:Moisture Associated Skin Damage from fecal incontinence and severe rectal bleeding.   Pressure Ulcer POA: Yes Measurement:15 cm x 7 cm x 0.1 cm, includes perineal area and buttocks, upper posterior thighs. Wound ZOX:WRUEAVW Drainage (amount, consistency, odor)Minimal serous drainage. No odor.  Periwound:Erythema Dressing procedure/placement/frequency:I will today provide orders for a therapeutic mattress with low air loss feature and guidance for the nursing staff with regard to turning and repositioning (side to side with the superior leg brought up and over the inferior leg to open and expose the perianal region to air). We will float the heels as a pressure injury prevention intervention. Topical care to the affected skin will be to cleanse with our pH balanced, no rinse cleanser, pat dry, followed by dusting with Nystatin powder. This will be covered with a thin layer of our clear moisture barrier ointment.  Will not follow at this time.  Please re-consult if needed.  Maple Hudson RN BSN CWON Pager [phone]

## 2015-07-14 NOTE — Progress Notes (Signed)
Informed by nursing staff patient has continuous 1 second pauses throughout the day Remainder vital signs stable Noted to be on a myriad of AV nodal agents Will hold some of these and monitor heart rate/blood pressure

## 2015-07-14 NOTE — Progress Notes (Signed)
[facility] - [place] at [facility]   PATIENT NAME: Katherine [name]    MR#:  [id number]  DATE OF BIRTH:  1932-07-22  SUBJECTIVE:   Discharged on 07/13/2015 to SNF after prolonged stay with C diff. Return same day with significant rectal bleeding. No further bleeding. One unit packed RBC transfused 07/13/2015.  No diarrhea or abdominal pain. Nuclear GI bleeding scan was negative  REVIEW OF SYSTEMS:   Review of Systems  Constitutional: Negative for fever, chills and weight loss.  HENT: Negative for ear discharge, ear pain and nosebleeds.   Eyes: Negative for blurred vision, pain and discharge.  Respiratory: Negative for sputum production, shortness of breath, wheezing and stridor.   Cardiovascular: Positive for leg swelling. Negative for chest pain, palpitations, orthopnea and PND.  Gastrointestinal: Negative for vomiting, diarrhea and blood in stool.  Genitourinary: Negative for urgency and frequency.  Musculoskeletal: Negative for back pain and joint pain.  Neurological: Positive for weakness. Negative for sensory change, speech change and focal weakness.  Psychiatric/Behavioral: Negative for depression. The patient is not nervous/anxious.   All other systems reviewed and are negative.   DRUG ALLERGIES:   Allergies  Allergen Reactions  . Benzocaine Other (See Comments)    Reaction:  Unknown   . Ivp Dye [Iodinated Diagnostic Agents] Other (See Comments)    Reaction:  Tachycardia/SVT  . Penicillins Other (See Comments)    Reaction:  Unknown   . Sulfa Antibiotics Other (See Comments)    Reaction:  Unknown    VITALS:  Blood pressure 148/69, pulse 108, temperature 98 F (36.7 C), temperature source Oral, resp. rate 22, height  (1.6 m), weight 86.9 kg (191 lb 9.3 oz), SpO2 99 %. PHYSICAL EXAMINATION:  Physical Exam  Constitutional: She is oriented to person, place, and time and well-developed, well-nourished, and in no distress.  HENT:  Head:  Normocephalic and atraumatic.  Eyes: Conjunctivae and EOM are normal. Pupils are equal, round, and reactive to light.  Neck: Normal range of motion. Neck supple. No tracheal deviation present. No thyromegaly present.  Cardiovascular: Normal rate, regular rhythm and normal heart sounds.   Pulmonary/Chest: Effort normal and breath sounds normal. No respiratory distress. She has no wheezes. She exhibits no tenderness.  Abdominal: Soft. Bowel sounds are normal. She exhibits distension (gaseous). There is tenderness.  Musculoskeletal: Normal range of motion. She exhibits edema.  Neurological: She is alert and oriented to person, place, and time. No cranial nerve deficit.  Skin: Skin is warm and dry. No rash noted.  Psychiatric: Mood and affect normal.  Pallor positive  LABORATORY PANEL:   CBC  Recent Labs Lab 07/14/15 0303 07/14/15 0845  WBC 10.2  --   HGB 8.8* 8.6*  HCT 27.2*  --   PLT 210  --     Chemistries   Recent Labs Lab 07/09/15 1439 07/10/15 0433  07/14/15 0303  NA  --  137  < > 137  K 3.7 3.1*  < > 3.9  CL  --  96*  < > 100*  CO2  --  34*  < > 28  GLUCOSE  --  79  < > 202*  BUN  --  23*  < > 30*  CREATININE  --  0.74  < > 0.84  CALCIUM  --  8.4*  < > 8.4*  MG 1.9  --   --   --   AST  --  26  --   --   ALT  --  23  --   --   ALKPHOS  --  71  --   --   BILITOT  --  0.7  --   --   < > = values in this interval not displayed.  Cardiac Enzymes No results for input(s): TROPONINI in the last 168 hours. RADIOLOGY:  Nm Gi Blood Loss  07/13/2015   CLINICAL DATA:  Rectal bleeding.  EXAM: NUCLEAR MEDICINE GASTROINTESTINAL BLEEDING SCAN  TECHNIQUE: Sequential abdominal images were obtained following intravenous administration of Tc-19m labeled red blood cells.  RADIOPHARMACEUTICALS:  21.36 mCi Tc-20m in-vitro labeled red cells.  COMPARISON:  None.  FINDINGS: When accounting for visceral activity and intravascular blood pool, there is no radiotracer accumulation typical of  intraluminal extravasation.  IMPRESSION: Negative. No identifiable source of rectal bleeding.   Electronically Signed   By: Marnee Spring M.D.   On: 07/13/2015 23:08   ASSESSMENT AND PLAN:    * BRBPR Likely hemorrhoidal. Bleeding scan negative. One unit of packed RBC transfused yesterday and hemoglobin seems stable. No further bleeding. Start full liquid diet. Continue monitoring hemoglobin. GI seeing the patient.  * C. Diff: Collitis: Diarrhea resolved On vacomycin  * DM. continue Lantus and Continue sliding scale.   * A-fib with RVR.   On home meds.  * Hypertension    continue benazepril, carvedilol, diltiazem, clonidine, furosemide, hydralazine  IV hydralazine prn.  * chronic diastolic CHF (EF at 60-65%). Stable  CODE STATUS: full  DVT Prophylaxis: SCDs  TOTAL TIME TAKING CARE OF THIS PATIENT: 35 minutes.    Milagros Loll R M.D on 07/14/2015 at 12:57 PM  Between 7am to 6pm - Pager - [phone]  After 6pm go to [email or web address] - password [email or web address] [facility]  [place] [facility]  Office  [phone]  CC: Primary care physician; Mila Merry, MD

## 2015-07-14 NOTE — Progress Notes (Signed)
Pt with current loose red stools x5, pt sleepy, will hold tx for am rounding for monitoring of hgb

## 2015-07-14 NOTE — Progress Notes (Signed)
Initial Nutrition Assessment  DOCUMENTATION CODES:   Severe malnutrition in context of acute illness/injury  INTERVENTION:   Medical Food Supplement Therapy: pt asking for Glucerna on her visit today, was drinking this well on last admission. Recommend TID once diet advanced, each supplement provides 220 kcal and 10 grams of protein. Likes Butter Pecan (only supplement in-house in this flavor). Will try a [employer] daily (each supplement provides 350 kcal and 20 grams of protein) as these supplements provide more kcals and protein per same volume but not available in [employer]. Cannot try mightyshakes or magic cup due to pt report of intolerance to milk and milk products Meals and Snacks: Cater to patient preferences Coordination of Care: on previous recent admission, RD recommend nutrition support via NG. This was discussed with MD, at the time, MD did not want, wanted to encourage po intake and supplements. If po intake continues to be poor on this admission, recommend this again.   NUTRITION DIAGNOSIS:   Inadequate oral intake related to acute illness, poor appetite, altered GI function as evidenced by NPO status, per patient/family report (poor intake on previous recent admission).  GOAL:   Patient will meet greater than or equal to 90% of their needs   MONITOR:    (Energy Intake, Digestive System, Electrolyte/Renal profile, Anthropometrics)  REASON FOR ASSESSMENT:    (RD Screen)    ASSESSMENT:    Pt admitted last night after being discharged yesterday after >20 day stay. Pt admitted with lower GI bleeding; GI consulted. Per MD Oh, no active bleeding at present, Bleeding Scan negative, likely hemorrhoidal bleed.   Diet Order:  Diet NPO time specified   Food and Nutrition Related History: on recent stay of greater than 20 days, pt demonstrating poor po intake and appetite. Pt eating minimal po from meal trays but would drink Glucerna. Meals typically consisted of  a bowl of jello, not much else. At one point, Glucerna ordered QID (if drinking 100% of these, pt with 880 kcals, 40 g of protein). Although based on report, pt did not drink 100% of these at all times. Only 50% of calories, a little over 50% of protein provided via Glucerna if pt drank 100% 4 times daily. Pt reports appetite slightly improved at present, asking for Glucerna shakes (likes only Butter Pecan). Pt has many food allergies/intolerances including milk and milk products, soy beans, raw onions, chocolate, nuts  Electrolyte and Renal Profile:  Recent Labs Lab 07/09/15 1439  07/12/15 0516 07/13/15 1534 07/14/15 0303  BUN  --   < > 26* 28* 30*  CREATININE  --   < > 0.81 0.80 0.84  NA  --   < > 136 135 137  K 3.7  < > 3.5 4.4 3.9  MG 1.9  --   --   --   --   < > = values in this interval not displayed. Glucose Profile:   Recent Labs  07/12/15 2059 07/13/15 0738 07/13/15 1149  GLUCAP 253* 116* 274*   Protein Profile:   Recent Labs Lab 07/10/15 0433  ALBUMIN 3.4*   Meds: acidophilus, lasix, reglan  Skin:   (stage II pressure ulcer on vagina, open areas around rectum)  Last BM:  8/16 large loose stool, Cdiff positive  Height:   Ht Readings from Last 1 Encounters:  07/13/15  (1.6 m)    Weight: On last admission, pt present with weight of 213 pounds on 7/24; noted wt loss. 10.3%. However, very difficulty  to assess true weight loss as pt with significant edema both admissions.   Wt Readings from Last 1 Encounters:  07/14/15 191 lb 9.3 oz (86.9 kg)   Filed Weights   07/13/15 1526 07/14/15 0550  Weight: 190 lb (86.183 kg) 191 lb 9.3 oz (86.9 kg)    Nutrition Focused Physical Exam: Nutrition-Focused physical exam completed. Findings are mild fat depletion, mild/moderate muscle depletion, and moderate edema.    BMI:  Body mass index is 33.95 kg/(m^2).  Estimated Nutritional Needs:   Kcal:  1744-1889 kcals (BEE 1118, 1.3 AF, 1.2-1.3 IF)   Protein:  62-78  g (1.2-1.5 g/kg)   Fluid:  1560-1820 mL (30-35 ml/kg)   HIGH Care Level  Romelle Starcher MS, RD, LDN [phone] Pager

## 2015-07-14 NOTE — Progress Notes (Signed)
Inpatient Diabetes Program Recommendations  AACE/ADA: New Consensus Statement on Inpatient Glycemic Control (2013)  Target Ranges:  Prepandial:   less than 140 mg/dL      Peak postprandial:   less than 180 mg/dL (1-2 hours)      Critically ill patients:  140 - 180 mg/dL   Diabetes history: Diabetes Outpatient Diabetes medications: Lantus 26 units q HS, Januvia 50 mg daily Current orders for Inpatient glycemic control:  None  Called and discussed patient with Dr. Elpidio Anis and requested insulin orders. He states he will place orders.  Will follow.  Thanks, Beryl Meager, RN, BC-ADM Inpatient Diabetes Coordinator Pager [phone]

## 2015-07-14 NOTE — Progress Notes (Signed)
Pt having 1.4 second pauses, VSS, notified Dr. Clint Guy.

## 2015-07-14 NOTE — Consult Note (Signed)
GI Inpatient Consult Note  Reason for Consult: LGI bleeding   Attending Requesting Consult:  History of Present Illness: Katherine [name] is a 81 y.o. female with recent bout of c.diff, requiring long hospitalization. Was discharged yesterday but came right back due to persistent bleeding. Pt was anemic and tachycardic. Admitted to ICU. Bleeding scan done last night was negative. No further bleeding this AM. Received blood transfusion last night.  Does have external hemorrhoids, which are painful.  Past Medical History:  Past Medical History  Diagnosis Date  . Diabetes mellitus without complication   . Hypertension   . CCF (congestive cardiac failure) 06/04/2015  . Varicose veins     of leg  . Bowel habit changes   . Anemia   . Diabetes mellitus with renal manifestation 03/19/2009  . Vitamin D deficiency   . Hyperglyceridemia, pure   . Hyperlipidemia 02/01/2010  . Hyponatremia   . Mitral valve disorder 04/01/2009  . Atherosclerosis of coronary artery 08/04/2009  . CHF (congestive heart failure)     EF 30-35% on echo 2014  . Allergy 04/04/2009  . GERD (gastroesophageal reflux disease) 11/06/2009  . Hemorrhoids, external without complications 08/04/2009  . Arthritis   . Arthralgia   . Myalgia   . Palpitations   . Chronic nausea   . Malaise and fatigue   . Edema   . Atrial fibrillation 04/01/2009  . Dysuria     Problem List: Patient Active Problem List   Diagnosis Date Noted  . Pressure ulcer 07/14/2015  . Hypokalemia 07/13/2015  . UTI (urinary tract infection) 07/13/2015  . Acute GI bleeding 07/13/2015  . Pneumonia   . Hypoglycemia associated with diabetes 06/26/2015  . Essential hypertension 06/26/2015  . Hyponatremia 06/25/2015  . Acute respiratory failure 06/25/2015  . Pleural effusion 06/25/2015  . Atelectasis 06/25/2015  . Sepsis 06/25/2015  . Fall   . Ileus   . C. difficile colitis 06/21/2015  . Ache in joint 06/04/2015  . Arthritis 06/04/2015  . CCF  (congestive cardiac failure) 06/04/2015  . Feeling bilious 06/04/2015  . Arteriosclerosis of coronary artery 06/04/2015  . Difficult or painful urination 06/04/2015  . Dermatitis, eczematoid 06/04/2015  . Essential (primary) hypertension 06/04/2015  . Fatigue 06/04/2015  . Hyperglyceridemia, pure 06/04/2015  . Hypertriglyceridemia 06/04/2015  . Below normal amount of sodium in the blood 06/04/2015  . Muscle ache 06/04/2015  . Awareness of heartbeats 06/04/2015  . Asymptomatic varicose veins 06/04/2015  . Avitaminosis D 06/04/2015  . Diabetes mellitus, type 2 05/05/2015  . A-fib 05/04/2015  . Glaucoma 05/04/2015  . Acute non-ST segment elevation myocardial infarction 05/04/2015  . Edema leg 01/15/2015  . AF (paroxysmal atrial fibrillation) 01/15/2015  . Malaise and fatigue 05/25/2010  . HLD (hyperlipidemia) 02/01/2010  . Acid reflux 11/06/2009  . Absolute anemia 10/09/2009  . Atherosclerosis of coronary artery 08/04/2009  . Hemorrhoids without complication 08/04/2009  . Allergic rhinitis 04/04/2009  . Disorder of mitral valve 04/01/2009  . Diabetes 03/19/2009    Past Surgical History: Past Surgical History  Procedure Laterality Date  . Tonsillectomy    . Breast surgery    . Abdominal hysterectomy    . Appendectomy    . Hernia repair    . Cholecystectomy    . Stents  2011    Allergies: Allergies  Allergen Reactions  . Benzocaine Other (See Comments)    Reaction:  Unknown   . Ivp Dye [Iodinated Diagnostic Agents] Other (See Comments)    Reaction:  Tachycardia/SVT  .  Penicillins Other (See Comments)    Reaction:  Unknown   . Sulfa Antibiotics Other (See Comments)    Reaction:  Unknown     Home Medications: Prescriptions prior to admission  Medication Sig Dispense Refill Last Dose  . acidophilus (RISAQUAD) CAPS capsule Take 1 capsule by mouth 2 (two) times daily. 60 capsule 0 unknown at unknown  . aspirin EC 81 MG tablet Take 81 mg by mouth daily.   unknown at  unknown  . atorvastatin (LIPITOR) 80 MG tablet Take 80 mg by mouth at bedtime.    unknown at unknown  . benazepril (LOTENSIN) 40 MG tablet Take 40 mg by mouth daily.   unknown at unknown  . carvedilol (COREG) 25 MG tablet Take 25 mg by mouth 2 (two) times daily.   unknown at unknown  . cloNIDine (CATAPRES) 0.1 MG tablet Take 1 tablet (0.1 mg total) by mouth 3 (three) times daily. 60 tablet 11 unknown at unknown  . digoxin (LANOXIN) 0.25 MG tablet Take 1 tablet (0.25 mg total) by mouth daily.   unknown at unknown  . diltiazem (CARDIZEM CD) 240 MG 24 hr capsule Take 1 capsule (240 mg total) by mouth daily.   unknown at unknown  . dorzolamide (TRUSOPT) 2 % ophthalmic solution Place 1 drop into both eyes 3 (three) times daily.   unknown at unknown  . famotidine (PEPCID) 20 MG tablet Take 1 tablet (20 mg total) by mouth daily.   unknown at unknown  . feeding supplement, GLUCERNA SHAKE, (GLUCERNA SHAKE) LIQD Take 237 mLs by mouth 2 (two) times daily between meals.  0 unknown at unknown  . furosemide (LASIX) 20 MG tablet Take 20 mg by mouth 2 (two) times daily.   unknown at unknown  . glipiZIDE (GLUCOTROL XL) 5 MG 24 hr tablet Take 5 mg by mouth daily.   unknown at unknown  . hydrALAZINE (APRESOLINE) 100 MG tablet Take 100 mg by mouth 3 (three) times daily.   unknown at unknown  . hydrocortisone cream 1 % Apply 1 application topically every 12 (twelve) hours as needed for itching (and rash).    PRN at PRN  . hyoscyamine (LEVSIN, ANASPAZ) 0.125 MG tablet Take 0.125 mg by mouth daily.   unknown at unknown  . insulin glargine (LANTUS) 100 UNIT/ML injection Inject 0.26 mLs (26 Units total) into the skin at bedtime. 10 mL 11 unknown at unknown  . latanoprost (XALATAN) 0.005 % ophthalmic solution Place 1 drop into both eyes at bedtime.   unknown at unknown  . metoCLOPramide (REGLAN) 5 MG tablet Take 5 mg by mouth 4 (four) times daily -  before meals and at bedtime.   unknown at unknown  . potassium chloride  (KLOR-CON M10) 10 MEQ tablet Take 10 mEq by mouth daily.   unknown at unknown  . saccharomyces boulardii (FLORASTOR) 250 MG capsule Take 1 capsule (250 mg total) by mouth 2 (two) times daily.   unknown at unknown   . sertraline (ZOLOFT) 50 MG tablet Take 50 mg by mouth daily.   unknown at unknown  . simethicone (MYLICON) 80 MG chewable tablet Chew 80 mg by mouth every 6 (six) hours as needed for flatulence.   PRN at PRN  . sitaGLIPtin (JANUVIA) 50 MG tablet Take 50 mg by mouth daily.   unknown at unknown  . spironolactone (ALDACTONE) 25 MG tablet Take 1 tablet (25 mg total) by mouth daily.   unknown at unknown  . timolol (BETIMOL) 0.25 % ophthalmic solution Place  1 drop into both eyes 2 (two) times daily.   unknown at unknown  . vancomycin (VANCOCIN) 50 mg/mL oral solution Take 5 mLs (250 mg total) by mouth every 6 (six) hours. 140 mL 0 unknown at unknown   Home medication reconciliation was completed with the patient.   Scheduled Inpatient Medications:   . sodium chloride   Intravenous Once  . acidophilus  1 capsule Oral BID  . atorvastatin  80 mg Oral QHS  . benazepril  40 mg Oral Daily  . carvedilol  25 mg Oral BID  . cloNIDine  0.1 mg Oral TID  . digoxin  0.25 mg Oral Daily  . diltiazem  240 mg Oral Daily  . dorzolamide  1 drop Both Eyes TID  . furosemide  20 mg Oral BID  . hydrALAZINE  100 mg Oral TID  . hyoscyamine  0.125 mg Oral Daily  . latanoprost  1 drop Both Eyes QHS  . linagliptin  5 mg Oral Daily  . metoCLOPramide  5 mg Oral TID AC & HS  . pantoprazole (PROTONIX) IV  40 mg Intravenous Q12H  . potassium chloride  10 mEq Oral Daily  . saccharomyces boulardii  250 mg Oral BID  . sertraline  50 mg Oral Daily  . sodium chloride  3 mL Intravenous Q12H  . spironolactone  25 mg Oral Daily  . timolol  1 drop Both Eyes BID  . vancomycin  250 mg Oral 4 times per day    Continuous Inpatient Infusions:     PRN Inpatient Medications:  albuterol, labetalol, ondansetron **OR**  ondansetron (ZOFRAN) IV  Family History: family history includes Cancer in her father; Heart attack in her mother.  The patient's family history is negative for inflammatory bowel disorders, GI malignancy, or solid organ transplantation.  Social History:   reports that she has never smoked. She does not have any smokeless tobacco history on file. She reports that she does not drink alcohol. The patient denies ETOH, tobacco, or drug use.   Review of Systems: Constitutional: Weight is stable.  Eyes: No changes in vision. ENT: No oral lesions, sore throat.  GI: see HPI.  Heme/Lymph: No easy bruising.  CV: No chest pain.  GU: No hematuria.  Integumentary: No rashes.  Neuro: No headaches.  Psych: No depression/anxiety.  Endocrine: No heat/cold intolerance.  Allergic/Immunologic: No urticaria.  Resp: No cough, SOB.  Musculoskeletal: No joint swelling.    Physical Examination: BP 153/51 mmHg  Pulse 76  Temp(Src) 98 F (36.7 C) (Oral)  Resp 18  Ht 5\' 3"  (1.6 m)  Wt 86.9 kg (191 lb 9.3 oz)  BMI 33.95 kg/m2  SpO2 98% Gen: NAD, alert and oriented x 4 HEENT: PEERLA, EOMI, Neck: supple, no JVD or thyromegaly Chest: CTA bilaterally, no wheezes, crackles, or other adventitious sounds CV: RRR, no m/g/c/r Abd: soft, NT, ND, +BS in all four quadrants; no HSM, guarding, ridigity, or rebound tenderness Ext: no edema, well perfused with 2+ pulses, Skin: no rash or lesions noted Lymph: no LAD  Data: Lab Results  Component Value Date   WBC 10.2 07/14/2015   HGB 8.8* 07/14/2015   HCT 27.2* 07/14/2015   MCV 87.6 07/14/2015   PLT 210 07/14/2015    Recent Labs Lab 07/13/15 1534 07/13/15 1916 07/14/15 0303  HGB 8.6* 7.6* 8.8*   Lab Results  Component Value Date   NA 137 07/14/2015   K 3.9 07/14/2015   CL 100* 07/14/2015   CO2 28 07/14/2015  BUN 30* 07/14/2015   CREATININE 0.84 07/14/2015   GLU 155 04/14/2015   Lab Results  Component Value Date   ALT 23 07/10/2015   AST  26 07/10/2015   ALKPHOS 71 07/10/2015   BILITOT 0.7 07/10/2015    Recent Labs Lab 07/13/15 1534  INR 1.98   Assessment/Plan: Ms. Arata is a 81 y.o. female with lower GI bleeding. No active bleeding now. Reviewed colonoscopy and EGD report from 2011. Had prominent external and internal hemorrhoids. No diverticuli seen. EGD only showed grade 1 esophagitis then. Pt likely has hemorrhoidal bleeding only.  Recommendations: Advance diet as tolerated. Continue to moniter hgb. Make sure pt gets anusol hc supp bid. Continue treatment for c.diff.  Thank you for the consult. Please call with questions or concerns.  [place] Freshour, Ezzard Standing, MD

## 2015-07-15 LAB — GLUCOSE, CAPILLARY
GLUCOSE-CAPILLARY: 288 mg/dL — AB (ref 65–99)
GLUCOSE-CAPILLARY: 401 mg/dL — AB (ref 65–99)
GLUCOSE-CAPILLARY: 348 mg/dL — AB (ref 65–99)
GLUCOSE-CAPILLARY: 257 mg/dL — AB (ref 65–99)
GLUCOSE-CAPILLARY: 256 mg/dL — AB (ref 65–99)

## 2015-07-15 LAB — CBC
HCT: 24.5 % — ABNORMAL LOW (ref 35.0–47.0)
Hemoglobin: 8.1 g/dL — ABNORMAL LOW (ref 12.0–16.0)
MCH: 29.5 pg (ref 26.0–34.0)
MCHC: 33.1 g/dL (ref 32.0–36.0)
MCV: 89 fL (ref 80.0–100.0)
PLATELETS: 185 10*3/uL (ref 150–440)
RBC: 2.75 MIL/uL — AB (ref 3.80–5.20)
RDW: 19.4 % — AB (ref 11.5–14.5)
WBC: 8 10*3/uL (ref 3.6–11.0)

## 2015-07-15 LAB — HEMOGLOBIN: HEMOGLOBIN: 7.9 g/dL — AB (ref 12.0–16.0)

## 2015-07-15 LAB — HEMOGLOBIN A1C: HEMOGLOBIN A1C: 7 % — AB (ref 4.0–6.0)

## 2015-07-15 MED ORDER — HYOSCYAMINE SULFATE 0.125 MG SL SUBL
0.1250 mg | SUBLINGUAL_TABLET | Freq: Every day | SUBLINGUAL | Status: DC
  Administered 2015-07-16 – 2015-07-17 (×2): 0.125 mg via ORAL
  Filled 2015-07-15 (×2): qty 1

## 2015-07-15 MED ORDER — INSULIN ASPART 100 UNIT/ML ~~LOC~~ SOLN
16.0000 [IU] | Freq: Once | SUBCUTANEOUS | Status: AC
  Administered 2015-07-15: 16 [IU] via SUBCUTANEOUS
  Filled 2015-07-15: qty 16

## 2015-07-15 MED ORDER — INSULIN GLARGINE 100 UNIT/ML ~~LOC~~ SOLN
10.0000 [IU] | Freq: Every day | SUBCUTANEOUS | Status: DC
  Administered 2015-07-15: 10 [IU] via SUBCUTANEOUS
  Filled 2015-07-15 (×2): qty 0.1

## 2015-07-15 NOTE — Progress Notes (Signed)
Notified Dr Nemiah Commander of patient's fsbs 401, patient had 5 unit of insulin at 10 as well as 20unit of lantus.  MD states "give 16units of novolog for this coverage, I will look if we need to increase her lantus later."   RN asked if MD wanted to order lab to confirm, MD states "No"

## 2015-07-15 NOTE — Progress Notes (Signed)
   07/15/15 0850  Clinical Encounter Type  Visited With Patient  Visit Type Initial  Consult/Referral To Chaplain  Spiritual Encounters  Spiritual Needs Prayer;Emotional  Stress Factors  Patient Stress Factors Health changes  Met w'patient. Provided spiritual care & prayer.  Chap. Jarl Sellitto G. [place]

## 2015-07-15 NOTE — Progress Notes (Signed)
Patient with order to transfer to 2A.  Room assignment 237, report given to receiving RN, Leah with no further questions.  Patient's belonging packed.  Patient and Daughter in law updated.

## 2015-07-15 NOTE — Care Management Important Message (Signed)
Important Message  Patient Details  Name: Katherine [name] MRN: [id number] Date of Birth: 1932-03-02   Medicare Important Message Given:  Yes-second notification given    Collie Siad, RN 07/15/2015, 10:59 AM

## 2015-07-15 NOTE — Progress Notes (Signed)
[facility] [place] [facility]   PATIENT NAME: Katherine [name]    MR#:  [id number]  DATE OF BIRTH:  Jan 17, 1932  SUBJECTIVE:   - No further GI bleeding noted. Received 1 unit of packed RBC transfusions this admission. -Not in any pressors, hemoglobin is stable. -Heart rate is elevated this morning however was noted to have one second pauses last nigh -patient denies any complaints at this time  REVIEW OF SYSTEMS:   Review of Systems  Constitutional: Negative for fever, chills and weight loss.  HENT: Negative for ear discharge, ear pain and nosebleeds.   Eyes: Negative for blurred vision, pain and discharge.  Respiratory: Negative for sputum production, shortness of breath, wheezing and stridor.   Cardiovascular: Negative for chest pain, palpitations, orthopnea and PND.  Gastrointestinal: Negative for vomiting, diarrhea and blood in stool.  Genitourinary: Negative for urgency and frequency.  Musculoskeletal: Negative for back pain and joint pain.  Neurological: Positive for weakness. Negative for sensory change, speech change and focal weakness.  Psychiatric/Behavioral: Negative for depression. The patient is not nervous/anxious.   All other systems reviewed and are negative.   DRUG ALLERGIES:   Allergies  Allergen Reactions  . Benzocaine Other (See Comments)    Reaction:  Unknown   . Ivp Dye [Iodinated Diagnostic Agents] Other (See Comments)    Reaction:  Tachycardia/SVT  . Penicillins Other (See Comments)    Reaction:  Unknown   . Sulfa Antibiotics Other (See Comments)    Reaction:  Unknown    VITALS:  Blood pressure 125/48, pulse 86, temperature 97.9 F (36.6 C), temperature source Oral, resp. rate 28, height 5\' 3"  (1.6 m), weight 86.9 kg (191 lb 9.3 oz), SpO2 98 %. PHYSICAL EXAMINATION:  Physical Exam  Constitutional: She is oriented to person, place, and time and well-developed, well-nourished, and in no distress.  HENT:  Head:  Normocephalic and atraumatic.  Eyes: Conjunctivae and EOM are normal. Pupils are equal, round, and reactive to light.  Neck: Normal range of motion. Neck supple. No tracheal deviation present. No thyromegaly present.  Cardiovascular: Normal rate, regular rhythm and normal heart sounds.   Pulmonary/Chest: Effort normal and breath sounds normal. No respiratory distress. She has no wheezes. She exhibits no tenderness.  Abdominal: Soft. Bowel sounds are normal. Distention: gaseous.  Musculoskeletal: Normal range of motion.  Neurological: She is alert and oriented to person, place, and time. No cranial nerve deficit.  Skin: Skin is warm and dry. No rash noted.  Psychiatric: Mood and affect normal.  Pallor positive  LABORATORY PANEL:   CBC  Recent Labs Lab 07/15/15 0212  WBC 8.0  HGB 8.1*  HCT 24.5*  PLT 185    Chemistries   Recent Labs Lab 07/09/15 1439 07/10/15 0433  07/14/15 0303  NA  --  137  < > 137  K 3.7 3.1*  < > 3.9  CL  --  96*  < > 100*  CO2  --  34*  < > 28  GLUCOSE  --  79  < > 202*  BUN  --  23*  < > 30*  CREATININE  --  0.74  < > 0.84  CALCIUM  --  8.4*  < > 8.4*  MG 1.9  --   --   --   AST  --  26  --   --   ALT  --  23  --   --   ALKPHOS  --  71  --   --  BILITOT  --  0.7  --   --   < > = values in this interval not displayed.  Cardiac Enzymes No results for input(s): TROPONINI in the last 168 hours. RADIOLOGY:  Nm Gi Blood Loss  07/13/2015   CLINICAL DATA:  Rectal bleeding.  EXAM: NUCLEAR MEDICINE GASTROINTESTINAL BLEEDING SCAN  TECHNIQUE: Sequential abdominal images were obtained following intravenous administration of Tc-97m labeled red blood cells.  RADIOPHARMACEUTICALS:  21.36 mCi Tc-35m in-vitro labeled red cells.  COMPARISON:  None.  FINDINGS: When accounting for visceral activity and intravascular blood pool, there is no radiotracer accumulation typical of intraluminal extravasation.  IMPRESSION: Negative. No identifiable source of rectal  bleeding.   Electronically Signed   By: Marnee Spring M.D.   On: 07/13/2015 23:08   ASSESSMENT AND PLAN:   81 year old female with multiple medical problems including hypertension, diabetes, congestive heart failure, recent prolonged admission for Clostridium difficile colitis last week was discharged to rehabilitation and readmitted for rectal bleed.  * BRBPR Likely hemorrhoidal. Bleeding scan negative. One unit of packed RBC transfused on admission and hemoglobin seems stable. No further bleeding. Appreciate GI consult Advance diet today  * C. Diff: Collitis: Diarrhea resolved On oral vancomycin-need for another 6 days  * Uncontrolled DM. Check HbA1c -Continue Lantus at 20 units in the morning and add 10 units at bedtime. -Continue sliding scale insulin  * A-fib with RVR.  Having 1 second pauses. Digoxin has been stopped last night. A stent remains on Cardizem and Coreg at this time. Heart rate is slightly elevated. Cardiology consult by a [facility] cardiology requested. Patient follows with Dr. Gwen Pounds as outpatient On home meds.  * Hypertension    continue benazepril, carvedilol, diltiazem, clonidine, furosemide, hydralazine  IV hydralazine prn.  * Chronic diastolic CHF (EF at 60-65%). Stable  CODE STATUS: full  DVT Prophylaxis: SCDs  TOTAL TIME TAKING CARE OF THIS PATIENT: 38 minutes.   Patient can be transferred to telemetry floor today  Raylie Maddison M.D on 07/15/2015 at 4:56 PM  Between 7am to 6pm - Pager - [phone]  After 6pm go to [email or web address] - password [email or web address] [facility]  [facility]  Office  [phone]  CC: Primary care physician; Alwyn Pea., MD

## 2015-07-15 NOTE — Progress Notes (Signed)
Dr Nemiah Commander paged for patient fsbs >400

## 2015-07-15 NOTE — Consult Note (Signed)
GI Inpatient Follow-up Note  Patient Identification: Katherine [name] is a 81 y.o. female  Subjective: No further bleeding. Hgb stable. Started anusol HC suppository.  Scheduled Inpatient Medications:  . sodium chloride   Intravenous Once  . acidophilus  1 capsule Oral BID  . atorvastatin  80 mg Oral QHS  . benazepril  40 mg Oral Daily  . carvedilol  25 mg Oral BID  . cloNIDine  0.1 mg Oral TID  . diltiazem  240 mg Oral Daily  . dorzolamide  1 drop Both Eyes TID  . feeding supplement (ENSURE ENLIVE)  237 mL Oral Q supper  . feeding supplement (GLUCERNA SHAKE)  237 mL Oral TID  . furosemide  20 mg Oral BID  . hydrALAZINE  100 mg Oral TID  . hydrocortisone  25 mg Rectal BID  . hydrocortisone cream   Topical BID  . [START ON 07/16/2015] hyoscyamine  0.125 mg Oral Daily  . insulin aspart  0-9 Units Subcutaneous TID WC  . insulin glargine  20 Units Subcutaneous Daily  . latanoprost  1 drop Both Eyes QHS  . linagliptin  5 mg Oral Daily  . metoCLOPramide  5 mg Oral TID AC & HS  . pantoprazole (PROTONIX) IV  40 mg Intravenous Q12H  . potassium chloride  10 mEq Oral Daily  . saccharomyces boulardii  250 mg Oral BID  . sertraline  50 mg Oral Daily  . sodium chloride  3 mL Intravenous Q12H  . spironolactone  25 mg Oral Daily  . timolol  1 drop Both Eyes BID  . vancomycin  250 mg Oral 4 times per day    Continuous Inpatient Infusions:     PRN Inpatient Medications:  albuterol, ondansetron **OR** ondansetron (ZOFRAN) IV  Review of Systems: Constitutional: Weight is stable.  Eyes: No changes in vision. ENT: No oral lesions, sore throat.  GI: see HPI.  Heme/Lymph: No easy bruising.  CV: No chest pain.  GU: No hematuria.  Integumentary: No rashes.  Neuro: No headaches.  Psych: No depression/anxiety.  Endocrine: No heat/cold intolerance.  Allergic/Immunologic: No urticaria.  Resp: No cough, SOB.  Musculoskeletal: No joint swelling.    Physical Examination: BP 143/92 mmHg   Pulse 93  Temp(Src) 97.8 F (36.6 C) (Oral)  Resp 22  Ht 5\' 3"  (1.6 m)  Wt 86.9 kg (191 lb 9.3 oz)  BMI 33.95 kg/m2  SpO2 96% Gen: NAD, alert and oriented x 4 HEENT: PEERLA, EOMI, Neck: supple, no JVD or thyromegaly Chest: CTA bilaterally, no wheezes, crackles, or other adventitious sounds CV: RRR, no m/g/c/r Abd: soft, NT, ND, +BS in all four quadrants; no HSM, guarding, ridigity, or rebound tenderness Ext: no edema, well perfused with 2+ pulses, Skin: no rash or lesions noted Lymph: no LAD  Data: Lab Results  Component Value Date   WBC 8.0 07/15/2015   HGB 8.1* 07/15/2015   HCT 24.5* 07/15/2015   MCV 89.0 07/15/2015   PLT 185 07/15/2015    Recent Labs Lab 07/14/15 1558 07/15/15 0156 07/15/15 0212  HGB 9.0* 7.9* 8.1*   Lab Results  Component Value Date   NA 137 07/14/2015   K 3.9 07/14/2015   CL 100* 07/14/2015   CO2 28 07/14/2015   BUN 30* 07/14/2015   CREATININE 0.84 07/14/2015   GLU 155 04/14/2015   Lab Results  Component Value Date   ALT 23 07/10/2015   AST 26 07/10/2015   ALKPHOS 71 07/10/2015   BILITOT 0.7 07/10/2015  Recent Labs Lab 07/13/15 1534  INR 1.98   Assessment/Plan: Katherine [name] is a 81 y.o. female with rectal bleeding, likely from hemorrhoids. Bleeding from c.diff colitis is possible but unlikely due to c.diff controlled adequately.   Recommendations: Advance diet as tolerated. Treat hemorrhoids. Will sign off. Call us back if significant bleeding recurs. Then, flex sigmoidoscopy may be needed. thanks Please call with questions or concerns.  Rakesha Dalporto, Ezzard Standing, MD

## 2015-07-15 NOTE — Progress Notes (Signed)
Patient transferred from CCU today around 1500. No complaints of pain so far. Afib on tele. Patient is oriented, but has some memory impairment. Also somewhat drowsy, does respond well to voice. Attempted to feed patient dinner, but patient is mostly interested in liquids and jello, only ate a few bites of chicken and rice. Bottom is still red with a couple of open sores that have granulating tissue. Patient is incontinent, but has had no bowel movement since transfer. Will continue to monitor.

## 2015-07-16 LAB — BASIC METABOLIC PANEL
Anion gap: 7 (ref 5–15)
BUN: 52 mg/dL — AB (ref 6–20)
CALCIUM: 8.5 mg/dL — AB (ref 8.9–10.3)
CO2: 29 mmol/L (ref 22–32)
CREATININE: 1.58 mg/dL — AB (ref 0.44–1.00)
Chloride: 103 mmol/L (ref 101–111)
GFR calc non Af Amer: 30 mL/min — ABNORMAL LOW (ref >60)
GFR, EST AFRICAN AMERICAN: 34 mL/min — AB (ref >60)
Glucose, Bld: 196 mg/dL — ABNORMAL HIGH (ref 65–99)
Potassium: 4 mmol/L (ref 3.5–5.1)
SODIUM: 139 mmol/L (ref 135–145)

## 2015-07-16 LAB — HEMOGLOBIN AND HEMATOCRIT, BLOOD
HCT: 27.9 % — ABNORMAL LOW (ref 35.0–47.0)
HEMOGLOBIN: 9 g/dL — AB (ref 12.0–16.0)

## 2015-07-16 LAB — GLUCOSE, CAPILLARY
GLUCOSE-CAPILLARY: 197 mg/dL — AB (ref 65–99)
GLUCOSE-CAPILLARY: 286 mg/dL — AB (ref 65–99)
Glucose-Capillary: 359 mg/dL — ABNORMAL HIGH (ref 65–99)
Glucose-Capillary: 343 mg/dL — ABNORMAL HIGH (ref 65–99)

## 2015-07-16 LAB — CBC
HCT: 23.3 % — ABNORMAL LOW (ref 35.0–47.0)
Hemoglobin: 7.5 g/dL — ABNORMAL LOW (ref 12.0–16.0)
MCH: 28.6 pg (ref 26.0–34.0)
MCHC: 32.2 g/dL (ref 32.0–36.0)
MCV: 88.7 fL (ref 80.0–100.0)
Platelets: 171 10*3/uL (ref 150–440)
RBC: 2.63 MIL/uL — ABNORMAL LOW (ref 3.80–5.20)
RDW: 19.2 % — AB (ref 11.5–14.5)
WBC: 8.4 10*3/uL (ref 3.6–11.0)

## 2015-07-16 LAB — MAGNESIUM: MAGNESIUM: 2.4 mg/dL (ref 1.7–2.4)

## 2015-07-16 LAB — PREPARE RBC (CROSSMATCH)

## 2015-07-16 MED ORDER — INSULIN ASPART 100 UNIT/ML ~~LOC~~ SOLN: 0.0000 [IU] | Freq: Three times a day (TID) | SUBCUTANEOUS | Status: DC

## 2015-07-16 MED ORDER — SODIUM CHLORIDE 0.9 % IV SOLN
Freq: Once | INTRAVENOUS | Status: AC
  Administered 2015-07-16: 11:00:00 via INTRAVENOUS

## 2015-07-16 MED ORDER — INSULIN GLARGINE 100 UNIT/ML ~~LOC~~ SOLN
25.0000 [IU] | Freq: Every day | SUBCUTANEOUS | Status: DC
  Administered 2015-07-17: 25 [IU] via SUBCUTANEOUS
  Filled 2015-07-16 (×3): qty 0.25

## 2015-07-16 MED ORDER — INSULIN ASPART 100 UNIT/ML ~~LOC~~ SOLN: 0.0000 [IU] | Freq: Every day | SUBCUTANEOUS | Status: DC

## 2015-07-16 MED ORDER — INSULIN GLARGINE 100 UNIT/ML ~~LOC~~ SOLN
12.0000 [IU] | Freq: Every day | SUBCUTANEOUS | Status: DC
  Administered 2015-07-16: 12 [IU] via SUBCUTANEOUS
  Filled 2015-07-16 (×2): qty 0.12

## 2015-07-16 NOTE — Progress Notes (Signed)
Inpatient Diabetes Program Recommendations  AACE/ADA: New Consensus Statement on Inpatient Glycemic Control (2013)  Target Ranges:  Prepandial:   less than 140 mg/dL      Peak postprandial:   less than 180 mg/dL (1-2 hours)      Critically ill patients:  140 - 180 mg/dL   Diabetes history: Diabetes Outpatient Diabetes medications: Lantus 26 units q HS, Januvia 50 mg daily Current orders for Inpatient glycemic control: Lantus 20 units qam, Lantus 10 units qpm, Novolog 0-9 units tid, Tradjenta   Please consider adding Novolog correction 0-5 units qhs from the glycemic control order set.   Susette Racer, RN, BA, MHA, CDE Diabetes Coordinator Inpatient Diabetes Program  [phone] (Team Pager) [phone] [facility] Office) 07/16/2015 7:44 AM

## 2015-07-16 NOTE — Progress Notes (Signed)
[facility] - [place] [facility]   PATIENT NAME: Katherine [name]    MR#:  [id number]  DATE OF BIRTH:  1932/10/26  SUBJECTIVE:   - Appears very weak, denies any complaints. When trying to update about her clinical condition- asked me to call her daughter-in-law and let her know- Updated Katherine [name]- daughter-in-law. - hb low at 7.5- Tx ordered - Physical therapy consult pending   REVIEW OF SYSTEMS:   Review of Systems  Constitutional: Negative for fever, chills and weight loss.  HENT: Negative for ear discharge, ear pain and nosebleeds.   Eyes: Negative for blurred vision, pain and discharge.  Respiratory: Negative for sputum production, shortness of breath, wheezing and stridor.   Cardiovascular: Negative for chest pain, palpitations, orthopnea and PND.  Gastrointestinal: Negative for vomiting, diarrhea and blood in stool.  Genitourinary: Negative for urgency and frequency.  Musculoskeletal: Negative for back pain and joint pain.  Neurological: Positive for weakness. Negative for sensory change, speech change and focal weakness.  Psychiatric/Behavioral: Negative for depression. The patient is not nervous/anxious.   All other systems reviewed and are negative.   DRUG ALLERGIES:   Allergies  Allergen Reactions  . Benzocaine Other (See Comments)    Reaction:  Unknown   . Ivp Dye [Iodinated Diagnostic Agents] Other (See Comments)    Reaction:  Tachycardia/SVT  . Penicillins Other (See Comments)    Reaction:  Unknown   . Sulfa Antibiotics Other (See Comments)    Reaction:  Unknown    VITALS:  Blood pressure 142/54, pulse 89, temperature 97.8 F (36.6 C), temperature source Oral, resp. rate 24, height 5\' 3"  (1.6 m), weight 85.73 kg (189 lb), SpO2 98 %. PHYSICAL EXAMINATION:  Physical Exam  Constitutional: She is oriented to person, place, and time and well-developed, well-nourished, and in no distress.  HENT:  Head: Normocephalic and atraumatic.   Eyes: Conjunctivae and EOM are normal. Pupils are equal, round, and reactive to light.  Neck: Normal range of motion. Neck supple. No tracheal deviation present. No thyromegaly present.  Cardiovascular: Normal rate, regular rhythm and normal heart sounds.   Pulmonary/Chest: Effort normal and breath sounds normal. No respiratory distress. She has no wheezes. She exhibits no tenderness.  Abdominal: Soft. Bowel sounds are normal. Distention: gaseous.  Musculoskeletal: Normal range of motion.  Neurological: She is alert and oriented to person, place, and time. No cranial nerve deficit.  Skin: Skin is warm and dry. No rash noted.  Psychiatric: Mood and affect normal.  Pallor positive  LABORATORY PANEL:   CBC  Recent Labs Lab 07/16/15 0409  WBC 8.4  HGB 7.5*  HCT 23.3*  PLT 171    Chemistries   Recent Labs Lab 07/10/15 0433  07/16/15 0409  NA 137  < > 139  K 3.1*  < > 4.0  CL 96*  < > 103  CO2 34*  < > 29  GLUCOSE 79  < > 196*  BUN 23*  < > 52*  CREATININE 0.74  < > 1.58*  CALCIUM 8.4*  < > 8.5*  MG  --   --  2.4  AST 26  --   --   ALT 23  --   --   ALKPHOS 71  --   --   BILITOT 0.7  --   --   < > = values in this interval not displayed.  Cardiac Enzymes No results for input(s): TROPONINI in the last 168 hours. RADIOLOGY:  No results found. ASSESSMENT AND  PLAN:   81 year old female with multiple medical problems including hypertension, diabetes, congestive heart failure, recent prolonged admission for Clostridium difficile colitis last week was discharged to rehabilitation and readmitted for rectal bleed.  * BRBPR adn acute blood loss anemia:  -Likely hemorrhoidal. Bleeding scan negative. One unit of packed RBC transfused on admission and another unit ordered for today. - No further bleeding. - Appreciate GI consult - started on hydrocortisone rectal suppositories. - Advanced diet   * C. Diff: Collitis: Diarrhea resolved On oral vancomycin-need for another 6  days  * Uncontrolled DM. Check HbA1c -adjust Lantus at 25 units in the morning and 12 units at bedtime. -Continue sliding scale insulin and added QHS coverage  * A-fib with RVR.  Having 1 second pauses. Digoxin has been stopped due to pauses. Patient remains on Cardizem and Coreg at this time. Heart rate is slightly elevated. Cardiology consult by a [facility] cardiology requested. Patient follows with Dr. Gwen Pounds as outpatient On home meds.  * Hypertension    continue benazepril, carvedilol, diltiazem, clonidine, hydralazine  IV hydralazine prn. Lasix held today due to worsened BUN/creatinine  * Chronic diastolic CHF (EF at 60-65%). Stable - euvolemic- lasix held today  * ARF- hold lasix today, monitor. Cont her benazepril and aldactone for now Recheck labs in am Urine output has been all right.  CODE STATUS: full code  DVT Prophylaxis: SCDs  TOTAL TIME TAKING CARE OF THIS PATIENT: 79 minutes.   Possible discharge tomorrow to rehab if stable Daughter-in-law Katherine [name] called and updated.  Enid Baas M.D on 07/16/2015 at 3:13 PM  Between 7am to 6pm - Pager - [phone]  After 6pm go to [email or web address] - password [email or web address] [facility]  [facility]  Office  [phone]  CC: Primary care physician; Alwyn Pea., MD

## 2015-07-16 NOTE — Progress Notes (Addendum)
Per lab, both attempts to draw H&H post transfusion have clotted in the tube. RN has had to reorder lab draws so that they can be redrawn. RN asked the lab to send one of their phlebotomists up to assure better draw this time.

## 2015-07-17 ENCOUNTER — Inpatient Hospital Stay: Payer: Medicare Other

## 2015-07-17 LAB — CBC
HCT: 27.6 % — ABNORMAL LOW (ref 35.0–47.0)
HEMOGLOBIN: 8.8 g/dL — AB (ref 12.0–16.0)
MCH: 28 pg (ref 26.0–34.0)
MCHC: 31.9 g/dL — ABNORMAL LOW (ref 32.0–36.0)
MCV: 87.7 fL (ref 80.0–100.0)
Platelets: 167 10*3/uL (ref 150–440)
RBC: 3.14 MIL/uL — AB (ref 3.80–5.20)
RDW: 18.8 % — ABNORMAL HIGH (ref 11.5–14.5)
WBC: 9 10*3/uL (ref 3.6–11.0)

## 2015-07-17 LAB — BUN: BUN: 67 mg/dL — ABNORMAL HIGH (ref 6–20)

## 2015-07-17 LAB — CREATININE, SERUM
Creatinine, Ser: 1.78 mg/dL — ABNORMAL HIGH (ref 0.44–1.00)
GFR calc Af Amer: 30 mL/min — ABNORMAL LOW (ref >60)
GFR, EST NON AFRICAN AMERICAN: 26 mL/min — AB (ref >60)

## 2015-07-17 LAB — TYPE AND SCREEN
ABO/RH(D): O POS
ANTIBODY SCREEN: NEGATIVE
UNIT DIVISION: 0
Unit division: 0
Unit division: 0
Unit division: 0

## 2015-07-17 LAB — GLUCOSE, CAPILLARY
GLUCOSE-CAPILLARY: 304 mg/dL — AB (ref 65–99)
GLUCOSE-CAPILLARY: 317 mg/dL — AB (ref 65–99)

## 2015-07-17 MED ORDER — FLUCONAZOLE 100 MG PO TABS
150.0000 mg | ORAL_TABLET | Freq: Once | ORAL | Status: AC
  Administered 2015-07-17: 150 mg via ORAL
  Filled 2015-07-17: qty 1

## 2015-07-17 MED ORDER — HYDROCORTISONE ACETATE 25 MG RE SUPP: 25.0000 mg | Freq: Two times a day (BID) | RECTAL | Status: AC

## 2015-07-17 MED ORDER — GLUCERNA SHAKE PO LIQD: 237.0000 mL | Freq: Three times a day (TID) | ORAL | Status: AC

## 2015-07-17 MED ORDER — PANTOPRAZOLE SODIUM 40 MG PO TBEC: 40.0000 mg | DELAYED_RELEASE_TABLET | Freq: Every day | ORAL | Status: AC

## 2015-07-17 MED ORDER — SODIUM CHLORIDE 0.9 % IV SOLN: INTRAVENOUS | Status: AC

## 2015-07-17 MED ORDER — INSULIN GLARGINE 100 UNIT/ML ~~LOC~~ SOLN: 15.0000 [IU] | Freq: Every day | SUBCUTANEOUS | Status: DC

## 2015-07-17 MED ORDER — VANCOMYCIN 50 MG/ML ORAL SOLUTION: 250.0000 mg | Freq: Four times a day (QID) | ORAL | Status: AC

## 2015-07-17 NOTE — Evaluation (Signed)
Physical Therapy Evaluation Patient Details Name: Katherine [name] MRN: [id number] DOB: July 01, 1932 Today's Date: 07/17/2015   History of Present Illness  Patient is an 81 y/o female with recent admission for c-diff and renal failure that presents with rectal bleed and GI hemorrhage from rehab. Patient has had multiple falls in the past 6 months. Patient with past medical history significant for CHF and A-fib. After her most recent fall there was some concern over L elbow x-ray, though per previous notes this was deemed benign.   Clinical Impression  Patient was recently discharged to SNF and re-admitted for rectal bleeding. She appears to be regressed from her physical conditioning when she went to rehab initially, as today she requires maximal assistance to maintain "sitting" for even a few minutes. Patient required heavy +2 assistance for rolling to and from bedpan and generally shows poor effort throughout session, likely due to significant deconditioning and lethargy. Patient will benefit from short term rehabilitation to increase her independence with mobility. Skilled acute PT services are indicated to address her mobility deficits.     Follow Up Recommendations SNF    Equipment Recommendations       Recommendations for Other Services       Precautions / Restrictions Precautions Precautions: Fall Precaution Comments: enteric isolation Restrictions Weight Bearing Restrictions: No      Mobility  Bed Mobility Overal bed mobility: Needs Assistance;+2 for physical assistance Bed Mobility: Rolling;Supine to Sit;Sit to Supine Rolling: Max assist;+2 for physical assistance   Supine to sit: Max assist Sit to supine: Max assist   General bed mobility comments: Patient generally displays poor effort with all transfers and complains of lower back pain with sitting on bedpain. Patient is extremely weak and deconditioned.   Transfers                 General transfer comment:  Patient unable to sit independently.   Ambulation/Gait                Stairs            Wheelchair Mobility    Modified Rankin (Stroke Patients Only)       Balance Overall balance assessment: Needs assistance   Sitting balance-Leahy Scale: Zero Sitting balance - Comments: Patient requires mod-maximal assistance to sit and leans posteriorly throughout even with extensive cuing for leaning herself forward.  Postural control: Posterior lean                                   Pertinent Vitals/Pain Pain Assessment:  (Patient complains of back pain after being assisted to bedpan. Patient reports decreased pain after she is transferred off the bedpan and re-positioned in bed. )    Home Living Family/patient expects to be discharged to:: Skilled nursing facility Living Arrangements: Spouse/significant other Available Help at Discharge:  (Husband is unable to assist. ) Type of Home: House Home Access: Stairs to enter Entrance Stairs-Rails: Left Entrance Stairs-Number of Steps: 3 Home Layout: Able to live on main level with bedroom/bathroom Home Equipment: Walker - 2 wheels;Toilet riser      Prior Function Level of Independence: Independent with assistive device(s)         Comments: used rolling walker     Hand Dominance        Extremity/Trunk Assessment   Upper Extremity Assessment: Generalized weakness           Lower Extremity Assessment:  Generalized weakness         Communication   Communication: No difficulties  Cognition Arousal/Alertness: Awake/alert Behavior During Therapy: Flat affect Overall Cognitive Status: Within Functional Limits for tasks assessed                      General Comments      Exercises Other Exercises Other Exercises: Supine ther-ex ankle pumps x 10, quad sets x 10, AAROM heel slides and SLRs x 10 bilaterally.       Assessment/Plan    PT Assessment Patient needs continued PT services   PT Diagnosis Difficulty walking;Generalized weakness   PT Problem List Decreased strength;Decreased activity tolerance;Decreased balance;Decreased mobility  PT Treatment Interventions DME instruction;Gait training;Stair training;Functional mobility training;Therapeutic activities;Therapeutic exercise;Balance training;Patient/family education   PT Goals (Current goals can be found in the Care Plan section) Acute Rehab PT Goals Patient Stated Goal: To use the bathroom  PT Goal Formulation: With patient Time For Goal Achievement: 07/31/15 Potential to Achieve Goals: Fair    Frequency Min 2X/week   Barriers to discharge Decreased caregiver support      Co-evaluation               End of Session   Activity Tolerance: Patient limited by lethargy (Patient limited by severe deconditioning. ) Patient left: in bed;with call bell/phone within reach Nurse Communication: Mobility status         Time: 9604-5409 PT Time Calculation (min) (ACUTE ONLY): 32 min   Charges:   PT Evaluation $Initial PT Evaluation Tier I: 1 Procedure PT Treatments $Therapeutic Activity: 8-22 mins   PT G Codes:       Kerin Ransom, PT, DPT    07/17/2015, 1:29 PM

## 2015-07-17 NOTE — Discharge Summary (Signed)
[facility] - [place] at [facility]   PATIENT NAME: Katherine [name]    MR#:  [id number]  DATE OF BIRTH:  06/18/1932  DATE OF ADMISSION:  07/13/2015 ADMITTING PHYSICIAN: Milagros Loll, MD  DATE OF DISCHARGE: 07/17/2015  PRIMARY CARE PHYSICIAN: Alwyn Pea., MD    ADMISSION DIAGNOSIS:  Rectal bleed [K62.5] Gastrointestinal hemorrhage, unspecified gastritis, unspecified gastrointestinal hemorrhage type [K92.2]  DISCHARGE DIAGNOSIS:  Active Problems:   Acute GI bleeding   Pressure ulcer   Protein-calorie malnutrition, severe   SECONDARY DIAGNOSIS:   Past Medical History  Diagnosis Date  . Diabetes mellitus without complication   . Hypertension   . CCF (congestive cardiac failure) 06/04/2015  . Varicose veins     of leg  . Bowel habit changes   . Anemia   . Diabetes mellitus with renal manifestation 03/19/2009  . Vitamin D deficiency   . Hyperglyceridemia, pure   . Hyperlipidemia 02/01/2010  . Hyponatremia   . Mitral valve disorder 04/01/2009  . Atherosclerosis of coronary artery 08/04/2009  . CHF (congestive heart failure)     EF 30-35% on echo 2014  . Allergy 04/04/2009  . GERD (gastroesophageal reflux disease) 11/06/2009  . Hemorrhoids, external without complications 08/04/2009  . Arthritis   . Arthralgia   . Myalgia   . Palpitations   . Chronic nausea   . Malaise and fatigue   . Edema   . Atrial fibrillation 04/01/2009  . Dysuria     HOSPITAL COURSE:   81 year old female with multiple medical problems including hypertension, diabetes, congestive heart failure, recent prolonged admission for Clostridium difficile colitis last week was discharged to rehabilitation and readmitted for rectal bleed.  * BRBPR adn acute blood loss anemia:  -Likely hemorrhoidal. Bleeding scan negative. One unit of packed RBC transfused on admission and another unit yesterday. Hemoglobin stable. - No further bleeding. - Appreciate GI consult -  started on hydrocortisone rectal suppositories. - Advanced diet to soft diet.  * C. Diff: Collitis: Diarrhea resolved On oral vancomycin-need for another 6 days  * Uncontrolled DM. HbA1c of 7 -Restart her Lantus 26 units at bedtime. -At the time of discharge she'll be started back on her Januvia and glipizide as well.  * A-fib with RVR. Having 1 second pauses. Digoxin has been stopped due to pauses. Patient remains on Cardizem and Coreg at this time. Heart rate is slightly elevated. Cardiology consult by a [facility] cardiology requested. Patient follows with Dr. Gwen Pounds as outpatient On home meds.  * Hypertension  continue benazepril, carvedilol, diltiazem, Lasix, clonidine, hydralazine IV hydralazine prn.  * Chronic diastolic CHF (EF at 60-65%). Stable - euvolemic- restarted Lasix at discharge  * ARF- secondary to acute urinary retention. Patient had the more than 800 cc urine retained in the bladder.  -Started a Foley catheter. Leave it in for at least couple of days and then take it out and do a voiding trial.  -Renal ultrasound done and no acute changes.  -Restarted Lasix.  *Vaginal discharge reported-one dose of Diflucan given.  DISCHARGE CONDITIONS:   Guarded  CONSULTS OBTAINED:  Treatment Team:  Alwyn Pea, MD  DRUG ALLERGIES:   Allergies  Allergen Reactions  . Benzocaine Other (See Comments)    Reaction:  Unknown   . Ivp Dye [Iodinated Diagnostic Agents] Other (See Comments)    Reaction:  Tachycardia/SVT  . Penicillins Other (See Comments)    Reaction:  Unknown   . Sulfa Antibiotics Other (See Comments)    Reaction:  Unknown     DISCHARGE MEDICATIONS:   Current Discharge Medication List    START taking these medications   Details  hydrocortisone (ANUSOL-HC) 25 MG suppository Place 1 suppository (25 mg total) rectally 2 (two) times daily. Qty: 12 suppository, Refills: 0    pantoprazole (PROTONIX) 40 MG tablet Take 1 tablet (40 mg total) by  mouth daily. Qty: 30 tablet, Refills: 0      CONTINUE these medications which have CHANGED   Details  feeding supplement, GLUCERNA SHAKE, (GLUCERNA SHAKE) LIQD Take 237 mLs by mouth 3 (three) times daily. Qty: 30 Can, Refills: 0    vancomycin (VANCOCIN) 50 mg/mL oral solution Take 5 mLs (250 mg total) by mouth every 6 (six) hours. Qty: 140 mL, Refills: 0      CONTINUE these medications which have NOT CHANGED   Details  acidophilus (RISAQUAD) CAPS capsule Take 1 capsule by mouth 2 (two) times daily. Qty: 60 capsule, Refills: 0    atorvastatin (LIPITOR) 80 MG tablet Take 80 mg by mouth at bedtime.     benazepril (LOTENSIN) 40 MG tablet Take 40 mg by mouth daily.    carvedilol (COREG) 25 MG tablet Take 25 mg by mouth 2 (two) times daily.    cloNIDine (CATAPRES) 0.1 MG tablet Take 1 tablet (0.1 mg total) by mouth 3 (three) times daily. Qty: 60 tablet, Refills: 11    diltiazem (CARDIZEM CD) 240 MG 24 hr capsule Take 1 capsule (240 mg total) by mouth daily.    dorzolamide (TRUSOPT) 2 % ophthalmic solution Place 1 drop into both eyes 3 (three) times daily.    famotidine (PEPCID) 20 MG tablet Take 1 tablet (20 mg total) by mouth daily.    furosemide (LASIX) 20 MG tablet Take 20 mg by mouth 2 (two) times daily.    glipiZIDE (GLUCOTROL XL) 5 MG 24 hr tablet Take 5 mg by mouth daily.    hydrALAZINE (APRESOLINE) 100 MG tablet Take 100 mg by mouth 3 (three) times daily.    hydrocortisone cream 1 % Apply 1 application topically every 12 (twelve) hours as needed for itching (and rash).     hyoscyamine (LEVSIN, ANASPAZ) 0.125 MG tablet Take 0.125 mg by mouth daily.    insulin glargine (LANTUS) 100 UNIT/ML injection Inject 0.26 mLs (26 Units total) into the skin at bedtime. Qty: 10 mL, Refills: 11    latanoprost (XALATAN) 0.005 % ophthalmic solution Place 1 drop into both eyes at bedtime.    metoCLOPramide (REGLAN) 5 MG tablet Take 5 mg by mouth 4 (four) times daily -  before meals and  at bedtime.    potassium chloride (KLOR-CON M10) 10 MEQ tablet Take 10 mEq by mouth daily.    saccharomyces boulardii (FLORASTOR) 250 MG capsule Take 1 capsule (250 mg total) by mouth 2 (two) times daily.    sertraline (ZOLOFT) 50 MG tablet Take 50 mg by mouth daily.    simethicone (MYLICON) 80 MG chewable tablet Chew 80 mg by mouth every 6 (six) hours as needed for flatulence.    sitaGLIPtin (JANUVIA) 50 MG tablet Take 50 mg by mouth daily.    spironolactone (ALDACTONE) 25 MG tablet Take 1 tablet (25 mg total) by mouth daily.    timolol (BETIMOL) 0.25 % ophthalmic solution Place 1 drop into both eyes 2 (two) times daily.      STOP taking these medications     aspirin EC 81 MG tablet      digoxin (LANOXIN) 0.25 MG tablet  DISCHARGE INSTRUCTIONS:   1. Foley catheter in for couple of days and then discontinue for a voiding trial. 2. Follow up with Dr. Juliann Pares in 1 week  If you experience worsening of your admission symptoms, develop shortness of breath, life threatening emergency, suicidal or homicidal thoughts you must seek medical attention immediately by calling 911 or calling your MD immediately  if symptoms less severe.  You Must read complete instructions/literature along with all the possible adverse reactions/side effects for all the Medicines you take and that have been prescribed to you. Take any new Medicines after you have completely understood and accept all the possible adverse reactions/side effects.   Please note  You were cared for by a hospitalist during your hospital stay. If you have any questions about your discharge medications or the care you received while you were in the hospital after you are discharged, you can call the unit and asked to speak with the hospitalist on call if the hospitalist that took care of you is not available. Once you are discharged, your primary care physician will handle any further medical issues. Please note that NO  REFILLS for any discharge medications will be authorized once you are discharged, as it is imperative that you return to your primary care physician (or establish a relationship with a primary care physician if you do not have one) for your aftercare needs so that they can reassess your need for medications and monitor your lab values.    Today   CHIEF COMPLAINT:   Chief Complaint  Patient presents with  . Rectal Bleeding     VITAL SIGNS:  Blood pressure 166/81, pulse 103, temperature 98 F (36.7 C), temperature source Oral, resp. rate 22, height 5\' 3"  (1.6 m), weight 92.08 kg (203 lb), SpO2 97 %.  I/O:   Intake/Output Summary (Last 24 hours) at 07/17/15 1158 Last data filed at 07/17/15 0800  Gross per 24 hour  Intake    330 ml  Output      0 ml  Net    330 ml    PHYSICAL EXAMINATION:   Physical Exam  Constitutional: She is oriented to person, place, and time and well-developed, well-nourished, and in no distress.  HENT:  Head: Normocephalic and atraumatic.  Eyes: Conjunctivae and EOM are normal. Pupils are equal, round, and reactive to light.  Neck: Normal range of motion. Neck supple. No tracheal deviation present. No thyromegaly present.  Cardiovascular: Normal rate, regular rhythm and normal heart sounds.  Pulmonary/Chest: Effort normal and breath sounds normal. No respiratory distress. She has no wheezes. She exhibits no tenderness.  Abdominal: Soft. Bowel sounds are normal. Distention: gaseous.  Musculoskeletal: Normal range of motion.  Neurological: She is alert and oriented to person, place, and time. No cranial nerve deficit.  Skin: Skin is warm and dry. No rash noted.  Psychiatric: Mood and affect normal.  Pallor positive  DATA REVIEW:   CBC  Recent Labs Lab 07/17/15 0406  WBC 9.0  HGB 8.8*  HCT 27.6*  PLT 167    Chemistries   Recent Labs Lab 07/16/15 0409 07/17/15 0406  NA 139  --   K 4.0  --   CL 103  --   CO2 29  --   GLUCOSE 196*   --   BUN 52* 67*  CREATININE 1.58* 1.78*  CALCIUM 8.5*  --   MG 2.4  --     Cardiac Enzymes No results for input(s): TROPONINI in the last 168 hours.  Microbiology Results  Results for orders placed or performed during the hospital encounter of 06/21/15  Stool culture     Status: None   Collection Time: 06/21/15  5:17 PM  Result Value Ref Range Status   Specimen Description STOOL  Final   Special Requests Normal  Final   Culture   Final    NO SALMONELLA OR SHIGELLA ISOLATED No Pathogenic E. coli detected NO CAMPYLOBACTER DETECTED    Report Status 06/23/2015 FINAL  Final  C difficile quick scan w PCR reflex ([facility] only)     Status: Abnormal   Collection Time: 06/21/15  5:17 PM  Result Value Ref Range Status   C Diff antigen POSITIVE (A) NEGATIVE Final   C Diff toxin POSITIVE (A) NEGATIVE Final   C Diff interpretation   Final    Positive for toxigenic C. difficile, active toxin production present.    Comment: CRITICAL RESULT CALLED TO, READ BACK BY AND VERIFIED WITH: DONALD SWEENEY ON 06/21/15 AT 1755 BY JEF   MRSA PCR Screening     Status: None   Collection Time: 06/25/15 10:48 AM  Result Value Ref Range Status   MRSA by PCR NEGATIVE NEGATIVE Final    Comment:        The GeneXpert MRSA Assay (FDA approved for NASAL specimens only), is one component of a comprehensive MRSA colonization surveillance program. It is not intended to diagnose MRSA infection nor to guide or monitor treatment for MRSA infections.   Urine culture     Status: None   Collection Time: 07/06/15 11:38 AM  Result Value Ref Range Status   Specimen Description URINE, RANDOM  Final   Special Requests NONE  Final   Culture 30,000 COLONIES/mL PROTEUS MIRABILIS  Final   Report Status 07/08/2015 FINAL  Final   Organism ID, Bacteria PROTEUS MIRABILIS  Final      Susceptibility   Proteus mirabilis - MIC*    AMPICILLIN <=2 SENSITIVE Sensitive     CEFTAZIDIME <=1 SENSITIVE Sensitive     CEFAZOLIN <=4  SENSITIVE Sensitive     CEFTRIAXONE <=1 SENSITIVE Sensitive     CIPROFLOXACIN <=0.25 SENSITIVE Sensitive     GENTAMICIN <=1 SENSITIVE Sensitive     IMIPENEM 2 SENSITIVE Sensitive     TRIMETH/SULFA <=20 SENSITIVE Sensitive     NITROFURANTOIN Value in next row Resistant      RESISTANT128    PIP/TAZO Value in next row Sensitive      SENSITIVE<=4    AMPICILLIN/SULBACTAM Value in next row Sensitive      SENSITIVE<=2    * 30,000 COLONIES/mL PROTEUS MIRABILIS  Culture, blood (routine x 2)     Status: None   Collection Time: 07/08/15  8:27 AM  Result Value Ref Range Status   Specimen Description BLOOD RIGHT HAND  Final   Special Requests BOTTLES DRAWN AEROBIC AND ANAEROBIC 10CC  Final   Culture NO GROWTH 5 DAYS  Final   Report Status 07/13/2015 FINAL  Final  Culture, blood (routine x 2)     Status: None   Collection Time: 07/08/15  8:34 AM  Result Value Ref Range Status   Specimen Description BLOOD RIGHT HAND  Final   Special Requests BOTTLES DRAWN AEROBIC AND ANAEROBIC [facility]  Final   Culture NO GROWTH 5 DAYS  Final   Report Status 07/13/2015 FINAL  Final    RADIOLOGY:  US Renal  07/17/2015   CLINICAL DATA:  Acute renal failure  EXAM: RENAL / URINARY TRACT ULTRASOUND COMPLETE  COMPARISON:  None.  FINDINGS: Right Kidney:  Length: 11.4 cm. Echogenicity within normal limits. No mass or hydronephrosis visualized.  Left Kidney:  Length: 11.7 cm. Echogenicity within normal limits. No mass or hydronephrosis visualized.  Bladder:  Decompressed by Foley catheter.  IMPRESSION: No acute abnormality noted.   Electronically Signed   By: Alcide Clever M.D.   On: 07/17/2015 11:31   Dg Chest Port 1 View  07/17/2015   CLINICAL DATA:  Weakness.  Congestive heart failure.  EXAM: PORTABLE CHEST - 1 VIEW  COMPARISON:  Acute abdominal series 06/29/2015  FINDINGS: Mild cardiomegaly is again seen. Mild edema is evident. Small bilateral effusions are present. Bibasilar airspace disease likely reflects atelectasis.  No  other significant airspace consolidation is present. The visualized soft tissues and bony thorax are unremarkable.  IMPRESSION: 1. Stable cardiomegaly and mild interstitial edema. 2. Small bilateral pleural effusions and airspace disease likely reflect atelectasis. 3. Findings compatible with congestive heart failure.   Electronically Signed   By: Marin Roberts M.D.   On: 07/17/2015 07:42    EKG:   Orders placed or performed during the hospital encounter of 06/21/15  . EKG 12-Lead  . EKG 12-Lead  . EKG 12-Lead  . EKG 12-Lead      Management plans discussed with the patient, family and they are in agreement.  CODE STATUS:     Code Status Orders        Start     Ordered   07/13/15 1640  Full code   Continuous     07/13/15 1641    Advance Directive Documentation        Most Recent Value   Type of Advance Directive  Living will   Pre-existing out of facility DNR order (yellow form or pink MOST form)     "MOST" Form in Place?        TOTAL TIME TAKING CARE OF THIS PATIENT: 45  minutes.    Enid Baas M.D on 07/17/2015 at 11:58 AM  Between 7am to 6pm - Pager - [phone]  After 6pm go to [email or web address] - password [email or web address] [facility]  [place]  Office  [phone]  CC: Primary care physician; Alwyn Pea., MD

## 2015-07-17 NOTE — Progress Notes (Signed)
Pt rested well during shift.  Pt not fond of the "repositioning bed"  Pt reassured it will help with the healing of her bottom.  No complaints during shift.  Will continue to monitor. Louis Meckel

## 2015-07-17 NOTE — Progress Notes (Signed)
Checked to see if pre-authorization would be needed for non-emergent EMS transport.  Per [employer]’s automated system, [phone], patient has a [employer] Medicare Advantage PPO policy.  [employer] Medicare PPO plans do not require pre-auth for non-emergent ground transports using service codes A0426 or A0428.   °

## 2015-07-17 NOTE — Progress Notes (Signed)
1300: Called [facility] and gave report to [employer].  Called EMS to transport pt to the facility.  Foley intact and draining.

## 2015-07-17 NOTE — Progress Notes (Signed)
Inpatient Diabetes Program Recommendations  AACE/ADA: New Consensus Statement on Inpatient Glycemic Control (2013)  Target Ranges:  Prepandial:   less than 140 mg/dL      Peak postprandial:   less than 180 mg/dL (1-2 hours)      Critically ill patients:  140 - 180 mg/dL   Results for Katherine [name], Katherine [name] (MRN [id number]) as of 07/17/2015 08:43  Ref. Range 07/15/2015 21:09 07/16/2015 07:15 07/16/2015 11:42 07/16/2015 16:50 07/16/2015 20:17  Glucose-Capillary Latest Ref Range: 65-99 mg/dL 409 (H) 811 (H) 914 (H) 359 (H) 343 (H)   Fasting glucose- /dl  Outpatient Diabetes medications: Lantus 26 units q HS, Januvia 50 mg daily  Current orders for Inpatient glycemic control: Lantus 25 units qam, Lantus 12 units qpm, Novolog 0-9 units tid, Tradjenta   Please consider adding Novolog correction 0-5 units qhs from the glycemic control order set.    Post prandial blood sugars are elevated- Consider adding mealtime insulin; Novolog 3 units tid with meals (hold if patient eats less than 50%)    Susette Racer, RN, BA, [place], CDE Diabetes Coordinator Inpatient Diabetes Program  [phone] (Team Pager) [phone] [facility] Office) 07/16/2015 7:44 AM

## 2015-07-17 NOTE — Progress Notes (Signed)
Bladder scan performed.  Over 600 ml in bladder, pt requesting bed pan.  MD, Dr. Betti Cruz notified. MD said to encourage voids and continue to monitor.   Louis Meckel

## 2015-07-22 ENCOUNTER — Ambulatory Visit: Payer: Self-pay | Admitting: Family Medicine

## 2015-07-22 LAB — GLUCOSE, CAPILLARY
Glucose-Capillary: 228 mg/dL — ABNORMAL HIGH (ref 65–99)
Glucose-Capillary: 241 mg/dL — ABNORMAL HIGH (ref 65–99)

## 2015-08-29 DEATH — deceased
